# Patient Record
Sex: Male | Born: 1950 | Race: Black or African American | Hispanic: No | Smoking: Former smoker
Health system: Southern US, Community
[De-identification: ages and names within clinical notes are randomized; demographics above are authoritative.]

## PROBLEM LIST (undated history)

## (undated) DIAGNOSIS — I10 Essential (primary) hypertension: Secondary | ICD-10-CM

## (undated) DIAGNOSIS — C61 Malignant neoplasm of prostate: Secondary | ICD-10-CM

## (undated) DIAGNOSIS — R0789 Other chest pain: Secondary | ICD-10-CM

## (undated) DIAGNOSIS — R030 Elevated blood-pressure reading, without diagnosis of hypertension: Secondary | ICD-10-CM

## (undated) DIAGNOSIS — E785 Hyperlipidemia, unspecified: Secondary | ICD-10-CM

## (undated) DIAGNOSIS — C679 Malignant neoplasm of bladder, unspecified: Secondary | ICD-10-CM

## (undated) DIAGNOSIS — R9439 Abnormal result of other cardiovascular function study: Secondary | ICD-10-CM

## (undated) DIAGNOSIS — M1990 Unspecified osteoarthritis, unspecified site: Secondary | ICD-10-CM

## (undated) HISTORY — DX: Abnormal result of other cardiovascular function study: R94.39

## (undated) HISTORY — DX: Other chest pain: R07.89

## (undated) HISTORY — PX: BACK SURGERY: SHX140

## (undated) HISTORY — DX: Elevated blood-pressure reading, without diagnosis of hypertension: R03.0

## (undated) HISTORY — DX: Unspecified osteoarthritis, unspecified site: M19.90

## (undated) HISTORY — DX: Hyperlipidemia, unspecified: E78.5

## (undated) HISTORY — DX: Essential (primary) hypertension: I10

---
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
## 1998-06-23 ENCOUNTER — Emergency Department (HOSPITAL_COMMUNITY): Admission: EM | Admit: 1998-06-23 | Discharge: 1998-06-23 | Payer: Self-pay | Admitting: Emergency Medicine

## 1998-07-01 ENCOUNTER — Ambulatory Visit (HOSPITAL_COMMUNITY): Admission: RE | Admit: 1998-07-01 | Discharge: 1998-07-01 | Payer: Self-pay | Admitting: Emergency Medicine

## 1998-07-01 ENCOUNTER — Encounter: Payer: Self-pay | Admitting: Emergency Medicine

## 2003-10-05 ENCOUNTER — Encounter: Admission: RE | Admit: 2003-10-05 | Discharge: 2003-10-05 | Payer: Self-pay | Admitting: Neurosurgery

## 2003-10-21 ENCOUNTER — Encounter: Admission: RE | Admit: 2003-10-21 | Discharge: 2003-10-21 | Payer: Self-pay | Admitting: Neurosurgery

## 2003-11-17 ENCOUNTER — Encounter: Admission: RE | Admit: 2003-11-17 | Discharge: 2003-12-10 | Payer: Self-pay | Admitting: Neurosurgery

## 2010-04-15 ENCOUNTER — Encounter: Payer: Self-pay | Admitting: Internal Medicine

## 2010-04-22 ENCOUNTER — Ambulatory Visit (HOSPITAL_COMMUNITY): Admission: RE | Admit: 2010-04-22 | Discharge: 2010-04-22 | Payer: Self-pay | Admitting: Family Medicine

## 2010-04-22 ENCOUNTER — Telehealth (INDEPENDENT_AMBULATORY_CARE_PROVIDER_SITE_OTHER): Payer: Self-pay

## 2010-04-28 ENCOUNTER — Ambulatory Visit: Payer: Self-pay | Admitting: Cardiology

## 2010-04-28 ENCOUNTER — Encounter: Payer: Self-pay | Admitting: Cardiology

## 2010-04-28 ENCOUNTER — Encounter (INDEPENDENT_AMBULATORY_CARE_PROVIDER_SITE_OTHER): Payer: Self-pay | Admitting: *Deleted

## 2010-04-28 ENCOUNTER — Ambulatory Visit (HOSPITAL_COMMUNITY): Admission: RE | Admit: 2010-04-28 | Discharge: 2010-04-28 | Payer: Self-pay | Admitting: Cardiology

## 2010-04-28 DIAGNOSIS — R9431 Abnormal electrocardiogram [ECG] [EKG]: Secondary | ICD-10-CM

## 2010-04-28 DIAGNOSIS — I10 Essential (primary) hypertension: Secondary | ICD-10-CM | POA: Insufficient documentation

## 2010-04-28 DIAGNOSIS — M1990 Unspecified osteoarthritis, unspecified site: Secondary | ICD-10-CM

## 2010-04-28 DIAGNOSIS — R079 Chest pain, unspecified: Secondary | ICD-10-CM | POA: Insufficient documentation

## 2010-04-29 ENCOUNTER — Ambulatory Visit: Payer: Self-pay | Admitting: Cardiology

## 2010-04-29 ENCOUNTER — Encounter (INDEPENDENT_AMBULATORY_CARE_PROVIDER_SITE_OTHER): Payer: Self-pay | Admitting: *Deleted

## 2010-04-29 ENCOUNTER — Encounter (HOSPITAL_COMMUNITY): Admission: RE | Admit: 2010-04-29 | Discharge: 2010-04-29 | Payer: Self-pay | Admitting: Cardiology

## 2010-04-29 LAB — CONVERTED CEMR LAB
ALT: 19 units/L
ALT: 19 units/L (ref 0–53)
AST: 18 units/L (ref 0–37)
Albumin: 4.6 g/dL
Alkaline Phosphatase: 57 units/L (ref 39–117)
BUN: 11 mg/dL
Basophils Absolute: 0 10*3/uL (ref 0.0–0.1)
Basophils Relative: 0 % (ref 0–1)
Calcium: 9.5 mg/dL
Chloride: 104 meq/L
Glucose, Bld: 106 mg/dL
HCT: 41.5 %
HDL: 55 mg/dL
LDL Cholesterol: 177 mg/dL — ABNORMAL HIGH (ref 0–99)
MCHC: 32 g/dL (ref 30.0–36.0)
MCV: 86.1 fL
Monocytes Relative: 7 % (ref 3–12)
Neutro Abs: 4.1 10*3/uL (ref 1.7–7.7)
Neutrophils Relative %: 60 % (ref 43–77)
Potassium: 4.5 meq/L
RBC: 4.82 M/uL (ref 4.22–5.81)
RDW: 14.5 % (ref 11.5–15.5)
Sodium: 140 meq/L
Sodium: 140 meq/L (ref 135–145)
TSH: 2.14 microintl units/mL
Total Bilirubin: 0.5 mg/dL (ref 0.3–1.2)
Total Protein: 7.5 g/dL (ref 6.0–8.3)
VLDL: 22 mg/dL (ref 0–40)
WBC: 6.9 10*3/uL

## 2010-05-02 ENCOUNTER — Encounter: Payer: Self-pay | Admitting: Cardiology

## 2010-05-02 ENCOUNTER — Encounter (INDEPENDENT_AMBULATORY_CARE_PROVIDER_SITE_OTHER): Payer: Self-pay | Admitting: *Deleted

## 2010-05-04 ENCOUNTER — Ambulatory Visit: Payer: Self-pay | Admitting: Cardiology

## 2010-05-04 ENCOUNTER — Encounter (INDEPENDENT_AMBULATORY_CARE_PROVIDER_SITE_OTHER): Payer: Self-pay | Admitting: *Deleted

## 2010-05-04 DIAGNOSIS — E785 Hyperlipidemia, unspecified: Secondary | ICD-10-CM | POA: Insufficient documentation

## 2010-05-04 DIAGNOSIS — E663 Overweight: Secondary | ICD-10-CM | POA: Insufficient documentation

## 2010-05-04 LAB — CONVERTED CEMR LAB
Basophils Absolute: 0 10*3/uL
Basophils Relative: 0 %
Eosinophils Absolute: 0.1 10*3/uL
Eosinophils Relative: 1 %
Lymphocytes Relative: 31 %
Lymphs Abs: 2.1 10*3/uL
Monocytes Absolute: 0.5 10*3/uL
Monocytes Relative: 7 %

## 2010-11-10 ENCOUNTER — Encounter (INDEPENDENT_AMBULATORY_CARE_PROVIDER_SITE_OTHER): Payer: Self-pay | Admitting: *Deleted

## 2010-12-03 ENCOUNTER — Encounter: Payer: Self-pay | Admitting: Neurosurgery

## 2010-12-13 NOTE — Miscellaneous (Signed)
Summary: labs cbcd,cmp,lipids,tsh,04/29/2010  Clinical Lists Changes  Observations: Added new observation of ABSOLUTE BAS: 0.0 K/uL (05/04/2010 8:24) Added new observation of BASOPHIL %: 0 % (05/04/2010 8:24) Added new observation of EOS ABSLT: 0.1 K/uL (05/04/2010 8:24) Added new observation of % EOS AUTO: 1 % (05/04/2010 8:24) Added new observation of ABSOLUTE MON: 0.5 K/uL (05/04/2010 8:24) Added new observation of MONOCYTE %: 7 % (05/04/2010 8:24) Added new observation of ABS LYMPHOCY: 2.1 K/uL (05/04/2010 8:24) Added new observation of LYMPHS %: 31 % (05/04/2010 8:24) Added new observation of CALCIUM: 9.5 mg/dL (16/08/9603 5:40) Added new observation of ALBUMIN: 4.6 g/dL (98/09/9146 8:29) Added new observation of PROTEIN, TOT: 7.5 g/dL (56/21/3086 5:78) Added new observation of SGPT (ALT): 19 units/L (04/29/2010 8:24) Added new observation of SGOT (AST): 18 units/L (04/29/2010 8:24) Added new observation of ALK PHOS: 57 units/L (04/29/2010 8:24) Added new observation of CREATININE: 0.92 mg/dL (46/96/2952 8:41) Added new observation of BUN: 11 mg/dL (32/44/0102 7:25) Added new observation of BG RANDOM: 106 mg/dL (36/64/4034 7:42) Added new observation of CO2 PLSM/SER: 25 meq/L (04/29/2010 8:24) Added new observation of CL SERUM: 104 meq/L (04/29/2010 8:24) Added new observation of K SERUM: 4.5 meq/L (04/29/2010 8:24) Added new observation of NA: 140 meq/L (04/29/2010 8:24) Added new observation of LDL: 177 mg/dL (59/56/3875 6:43) Added new observation of HDL: 55 mg/dL (32/95/1884 1:66) Added new observation of TRIGLYC TOT: 108 mg/dL (05/12/1600 0:93) Added new observation of CHOLESTEROL: 254 mg/dL (23/55/7322 0:25) Added new observation of PLATELETK/UL: 282 K/uL (04/29/2010 8:24) Added new observation of MCV: 86.1 fL (04/29/2010 8:24) Added new observation of HCT: 41.5 % (04/29/2010 8:24) Added new observation of HGB: 13.3 g/dL (42/70/6237 6:28) Added new observation of WBC  COUNT: 6.9 10*3/microliter (04/29/2010 8:24) Added new observation of TSH: 2.140 microintl units/mL (04/29/2010 8:24)

## 2010-12-13 NOTE — Assessment & Plan Note (Signed)
Summary: **per Dr.Steve Luking for abnormal stress test on 04/22/10/tg   Visit Type:  Initial Consult Primary Provider:  Drucilla Chalet  CC:  abnormal stress test.  History of Present Illness: [name] is seen in the office today at the kind request of Dr. Lubertha South for further assessment of an abnormal stress test on 2 occasions.  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  Stress testing was initially undertaken due to right-sided chest discomfort.  This occurred in the region of a previous mild injury and was exacerbated by movement of the right arm.  There is class 2-3 dyspnea on exertion, which is chronic.  Lifestyle is sedentary.  Due to chronic back problems and knee problems, the patient tolerates very little activity.  He does not load or unload his truck's.  Cardiovascular risk factors are relatively modest.  He reports no hypertension, but it sounds as if blood pressures have been borderline to mildly elevated for some time.  He has not had diabetes.  He is not certain as to his lipid status, but believes that he hasn't tested at work and has never been told of a significant abnormality.  He has a 20-pack-year history of cigarette smoking that was discontinued 11 years ago.  There is no prominent family history of cardiac disease, but his mother may have suffered myocardial infarction at a young age.  Patient's most recent stress test from a few days ago was obtained and reviewed as well as office records from Dr. Gerda Diss.  There was one-2 mm flat and slowly upsloping ST segment depression in the inferior leads as well as V5-V6.  This occurred at a moderate level of exertion and resolved rapidly in recovery.  Overall exercise tolerance was excellent.  Current Medications (verified): 1)  Viagra 100 Mg Tabs (Sildenafil Citrate) .... Take 1/2 Tab As Directed 2)   Tylenol Extra Strength 500 Mg Tabs (Acetaminophen) .... Use As Needed For Pain  Allergies (verified): No Known Drug Allergies  Past History:  Family History: Last updated: May 25, 2010 Father died at age 36 as the result of leukemia  Mother died at age 14 with a history of myocardial infarction and status post mastectomy for carcinoma of the breast  Siblings: Patient is one of 59 brothers with no sisters.  One died in childbirth.  One died at age 61 due to neoplastic disease.  None of the remaining 6 brothers has any heart problems  Patient's brothers have had a total of approximately 7 children, four of them boys.  His father also comes from a family with multiple brothers and few sisters.  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  One died in childbirth.  One died at age 89 due to neoplastic disease.  None of the remaining 6 brothers has any heart problems  Patient's brothers have had a total of approximately 7  children, four of them boys.  His father also comes from a family with multiple brothers and few sisters.  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  The patient denies anorexia, fever, weight loss, weight gain,  vision loss, decreased hearing, hoarseness, syncope, peripheral edema, prolonged cough, headaches, hemoptysis, abdominal pain, melena, hematochezia, and severe indigestion/heartburn.    Vital Signs:  Patient profile:   60 year old male Height:      70 inches Weight:      250 pounds BMI:     36.00 Pulse rate:   80 / minute BP sitting:   155 / 84  (right arm)  Vitals Entered By: Dreama Saa, CNA (April 28, 2010 12:44 PM)  Physical Exam  General:  Somewhat overweight; well-developed; no acute distress: HEENT-[place]/AT; PERRL; EOM intact; conjunctiva and lids nl:  Neck-No JVD; no carotid bruits: Endocrine-borderline thyromegaly: Thorax-limited mobility of the lumbosacral spine Lungs-No tachypnea, clear without rales, rhonchi or wheezes: CV-normal PMI; normal S1 and S2; modest early systolic ejection murmur Abdomen-BS normal; soft and non-tender without masses or organomegaly: MS-No deformities, cyanosis or clubbing; full range of motion in the knees; no effusions Neurologic-Nl cranial nerves; symmetric strength and tone: Skin- Warm, no sig. lesions: Extremities-Nl distal pulses; trace edema; minor scarring over the knees and anterior lower legs    EKG  Procedure date:  04/28/2010  Findings:      Normal sinus rhythm Possible septal infarction, age undetermined Nonspecific T wave abnormality No significant change when compared to her previous tracing obtained 04/22/10   Impression & Recommendations:  Problem # 1:  CHEST PAIN (ICD-786.50) Based upon the patient's pretest likelihood for coronary disease, there is probably at least a 50% probability that his EKG stress test results represents a false positive.  The rapid resolution of ST segment depression in the absence of symptoms also supports this formulation.  Since he requires DOT approval to drive, objective evidence for the absence of significant coronary disease will be necessary.  We will proceed with a stress Myoview study  at the patient's earliest convenience.  Problem # 2:  HYPERTENSION, BORDERLINE (ICD-401.9) Patient has labile, but probably significant hypertension.  This could be an explanation for a false-positive stress test.  It also could account for his mild resting EKG abnormalities.  An echocardiogram will be performed looking for LVH and to verify normal LV systolic function in the face of a possible anteroseptal MI on his EKG.  [name] and I discussed his orthopaedic issues at some length.  He believes that he has been seen by Dr. Hilda Lias in the past and received intra-articular steroids.  I encouraged him to return to Dr. Sanjuan Dame office if his symptoms become more severe.  For now, I recommended ibuprofen or naproxen.  Patient is eager to complete his evaluation so that he can return to work.  Testing will be performed tomorrow and Monday with a return office visit on Tuesday.  Other Orders: 2-D Echocardiogram (2D Echo) Nuclear Stress Test (Nuc Stress Test) Future Orders: T-Comprehensive Metabolic Panel ([id number]) ... 04/29/2010 T-CBC w/Diff ([id number]) ... 04/29/2010 T-TSH [phone]) ... 04/29/2010 T-Lipid Profile [phone]) ... 04/29/2010  Patient Instructions: 1)  Your physician recommends that you schedule a follow-up appointment in: Monday 2)  Your physician recommends that you return for lab work in: Friday 3)  Your physician has requested that you have an echocardiogram.  Echocardiography is a painless test that uses sound waves to create images of your heart. It provides your doctor with information about the size and shape of your heart and how well your heart's chambers and valves are working.  This procedure takes approximately one hour. There are no restrictions for this procedure. 4)  Your physician has requested that you have an exercise stress myoview.  For further information please visit [email or web address]  Please follow instruction sheet, as given. 5)  Your  physician has requested that you regularly monitor and record your blood pressure readings at home.  Please use the same machine at the same time of day to check your readings and record them to bring to your follow-up visit.

## 2010-12-13 NOTE — Miscellaneous (Signed)
Summary: echo 04/28/2010  Clinical Lists Changes  Observations: Added new observation of ECHOINTERP:   Study Conclusions    - Left ventricle: The cavity size was normal. Borderline LVH was     present. Systolic function was normal. The estimated ejection     fraction was in the range of 55% to 60%. Wall motion was normal;     there were no regional wall motion abnormalities.   - Left atrium: The atrium was mildly dilated.   - Right ventricle: The cavity size was normal. Wall thickness was     mildly increased.   - Atrial septum: No defect or patent foramen ovale was identified.   - Pericardium, extracardiac: A trivial pericardial effusion was     identified adjacent to the right atrium.   Transthoracic echocardiography. M-mode, complete 2D, spectral   Doppler, and color Doppler. Height: Height: 177.8cm. Height: 70in.   Weight: Weight: 113.4kg. Weight: 249.5lb. Body mass index: BMI:   35.9kg/m^2. Body surface area: BSA: 2.24m^2. Patient status:   Outpatient. Location: Echo laboratory.    --------------------------------------------------------------------  (04/28/2010 8:54)      Echocardiogram  Procedure date:  04/28/2010  Findings:        Study Conclusions    - Left ventricle: The cavity size was normal. Borderline LVH was     present. Systolic function was normal. The estimated ejection     fraction was in the range of 55% to 60%. Wall motion was normal;     there were no regional wall motion abnormalities.   - Left atrium: The atrium was mildly dilated.   - Right ventricle: The cavity size was normal. Wall thickness was     mildly increased.   - Atrial septum: No defect or patent foramen ovale was identified.   - Pericardium, extracardiac: A trivial pericardial effusion was     identified adjacent to the right atrium.   Transthoracic echocardiography. M-mode, complete 2D, spectral   Doppler, and color Doppler. Height: Height: 177.8cm. Height: 70in.   Weight: Weight:  113.4kg. Weight: 249.5lb. Body mass index: BMI:   35.9kg/m^2. Body surface area: BSA: 2.28m^2. Patient status:   Outpatient. Location: Echo laboratory.    --------------------------------------------------------------------

## 2010-12-13 NOTE — Miscellaneous (Signed)
Summary: nuclear study 04/29/2010  Clinical Lists Changes  Observations: Added new observation of NUCLEAR NOS:   Ordering Physician: [place] Bing    Reading Physician: [place] Bing    Clinical Data: 60 year old gentleman with an abnormal graded   exercise test, who is .  employed as a IT trainer and requires a   Doctor, general practice.    NUCLEAR MEDICINE STRESS MYOVIEW STUDY WITH SPECT AND LEFT   VENTRICULAR EJECTION FRACTION    Radionuclide Data: One-day rest/stress protocol performed with   10/30 mCi of Tc-43m Myoview.    Stress Data: Treadmill exercise performed to a workload of 10.4   mets and a heart rate of 150, 92% of patients age predicted   maximum.  Exercise discontinued due to dyspnea and fatigue; no   chest discomfort reported.  Blood pressure increased from a resting   value of 140/80 to 160/90 during exercise and 180/90 early in   recovery, a normal response.  No arrhythmias noted.    EKG: Normal sinus rhythm; shallow inferior T-wave inversions.   Stress EKG: Insignificant upsloping ST-segment depression    Scintigraphic Data: Acquisition notable for minor movement during   both phases of the study.  The left ventricle was mildly dilated.   There was very mild diaphragmatic attenuation.  On tomographic   images reconstructed in standard planes, a small mild defect was   present inferiorly.  Findings on the resting portion of the study   were identical.  The gated reconstruction revealed normal regional   and global LV systolic function as well as normal systolic   accentuation of activity throughout.  Estimated ejection fraction   was 56%.    IMPRESSION:   Negative stress nuclear myocardial study revealing fairly good   exercise capacity, a negative stress EKG, mild left ventricular   dilatation and normal left ventricular systolic function.  By   scintigraphic imaging, myocardial perfusion was normal except for   the presence of minor diaphragmatic  attenuation.  Other findings as   noted. (04/29/2010 8:53)      Nuclear Study  Procedure date:  04/29/2010  Findings:        Ordering Physician: [place] Bing    Reading Physician: [place] Bing    Clinical Data: 60 year old gentleman with an abnormal graded   exercise test, who is .  employed as a IT trainer and requires a   Doctor, general practice.    NUCLEAR MEDICINE STRESS MYOVIEW STUDY WITH SPECT AND LEFT   VENTRICULAR EJECTION FRACTION    Radionuclide Data: One-day rest/stress protocol performed with   10/30 mCi of Tc-45m Myoview.    Stress Data: Treadmill exercise performed to a workload of 10.4   mets and a heart rate of 150, 92% of patients age predicted   maximum.  Exercise discontinued due to dyspnea and fatigue; no   chest discomfort reported.  Blood pressure increased from a resting   value of 140/80 to 160/90 during exercise and 180/90 early in   recovery, a normal response.  No arrhythmias noted.    EKG: Normal sinus rhythm; shallow inferior T-wave inversions.   Stress EKG: Insignificant upsloping ST-segment depression    Scintigraphic Data: Acquisition notable for minor movement during   both phases of the study.  The left ventricle was mildly dilated.   There was very mild diaphragmatic attenuation.  On tomographic   images reconstructed in standard planes, a small mild defect was   present inferiorly.  Findings on the resting portion of  the study   were identical.  The gated reconstruction revealed normal regional   and global LV systolic function as well as normal systolic   accentuation of activity throughout.  Estimated ejection fraction   was 56%.    IMPRESSION:   Negative stress nuclear myocardial study revealing fairly good   exercise capacity, a negative stress EKG, mild left ventricular   dilatation and normal left ventricular systolic function.  By   scintigraphic imaging, myocardial perfusion was normal except for   the presence of  minor diaphragmatic attenuation.  Other findings as   noted.

## 2010-12-13 NOTE — Assessment & Plan Note (Signed)
Summary: f/u early next week per checkout on 04/28/10/tg   Visit Type:  Follow-up Primary Provider:  Drucilla Chalet   History of Present Illness: Return visit for this very pleasant 60 -year-old gentleman with an abnormal standard treadmill test and atypical right chest discomfort, most likely of musculoskeletal origin.  Since his last visit, he has been asymptomatic from a cardiopulmonary standpoint.  An echocardiogram showed borderline LVH and was otherwise normal.  A stress nuclear study showed good exercise tolerance with no evidence for myocardial ischemia.  Ironically, his stress EKG was not even abnormal on that study although it had been abnormal on 2 previous occasions.  EKG  Procedure date:  05/04/2010  Findings:      Normal sinus rhythm Within normal limits No previous tracing for comparison.   Current Medications (verified): 1)  Viagra 100 Mg Tabs (Sildenafil Citrate) .... Take 1/2 Tab As Directed 2)  Tylenol Extra Strength 500 Mg Tabs (Acetaminophen) .... Use As Needed For Pain 3)  Chlorthalidone 25 Mg Tabs (Chlorthalidone) .... Take 1/2  Tablet By Mouth Daily 4)  Simvastatin 40 Mg Tabs (Simvastatin) .... 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    Vital Signs:  Patient profile:   60 year old male Weight:      251 pounds Pulse rate:   62 / minute BP sitting:   151 / 90  (right arm)  Vitals Entered By: Dreama Saa, CNA (May 04, 2010 10:37 AM)  Physical Exam  General:  Somewhat overweight; well-developed; no acute distress: HEENT-[place]/AT; PERRL; EOM intact; conjunctiva and  lids nl:  Neck-No JVD; no carotid bruits: Endocrine-borderline thyromegaly: Lungs-No tachypnea, clear without rales, rhonchi or wheezes: CV-normal PMI; normal S1 and S2; modest early systolic ejection murmur Abdomen-BS normal; soft and non-tender without masses or organomegaly: MS-No deformities, cyanosis or clubbing; full range of motion in the knees; no effusions Neurologic-Nl cranial nerves; symmetric strength and tone: Skin- Warm, no sig. lesions: Extremities-Nl distal pulses; trace edema   Impression & Recommendations:  Problem # 1:  HYPERTENSION, BORDERLINE (ICD-401.9) Blood pressure has been abnormal on 2 office visits.  Moreover, echocardiogram shows evidence for LVH, which also probably accounts for his EKG abnormalities.  Pharmacologic treatment is warranted, and diuretics will be started.  Problem # 2:  HYPERLIPIDEMIA (ICD-272.4) LDL cholesterol is 177, which does not, strictly speaking, qualify for pharmacologic therapy by current guidelines, but in the setting of metabolic syndrome, treatment is certainly appropriate.  Simvastatin 40 mg q.d. will be started with a lipid profile assessed in one month.  Problem # 3:  OVERWEIGHT (ICD-278.02) Weight loss was discussed at length with Mr. Dian Situ.  He does not appear to have confidence that he can accomplish this, but continued encouragement should be offered.  Problem # 4:  CHEST PAIN (ICD-786.50) His chest pain has the characteristics of discomfort of musculoskeletal origin and should not raise additional concern regarding heart disease.   A negative stress nuclear study with the patient achieving a good workload and heart rate is quite reassuring regarding the absence of significant obstructive coronary disease.  The positive standard treadmill test likely represents a false positive related to LVH.  I  will be happy to see this nice gentleman at anytime that Dr. Gerda Diss deems appropriate.  His records will be forwarded to to HR at  his place of employment at his request, Fax #[phone]  Other Orders: Future Orders: T-Comprehensive Metabolic Panel [id number]) ... 06/03/2010 T-Lipid Profile [id number]) ... 06/03/2010  Patient Instructions: 1)  Your physician recommends that you schedule a follow-up appointment in: as needed 2)  Your physician recommends that you return for lab work in: 1 month 3)  Your physician has recommended you make the following change in your medication: start chlorthalidone 25mg  1/2 tablet daily 4)  simvastatin 40mg  daily 5)  consent to send medical information to your HR office Prescriptions: SIMVASTATIN 40 MG TABS (SIMVASTATIN) Take one tablet by mouth daily at bedtime  #30 x 3   Entered by:   Teressa Lower RN   Authorized by:   Kathlen Brunswick, MD, [facility]   Signed by:   Teressa Lower RN on 05/04/2010   Method used:   Electronically to        [facility] [phone]* (retail)       [address]       [place], [place]  [address]       Ph: [phone] or [phone]       Fax: [phone]   RxID:   [id number] CHLORTHALIDONE 25 MG TABS (CHLORTHALIDONE) Take 1/2  tablet by mouth daily  #15 x 3   Entered by:   Teressa Lower RN   Authorized by:   Kathlen Brunswick, MD, [facility]   Signed by:   Teressa Lower RN on 05/04/2010   Method used:   Electronically to        [facility] [phone]* (retail)       [address]       [place], [place]  [address]       Ph: [phone] or [phone]       Fax: [phone]   RxID:   [id number]

## 2010-12-13 NOTE — Letter (Signed)
Summary: [place] Results Engineer, agricultural at [facility]  [address], [place] [address]   Phone: [phone]  Fax: [phone]      May 02, 2010 MRN: [id number]   MYLES TAVELLA [address] [place], [place]  [address]   Dear Mr. KENYON,  Your test ordered by Selena Batten has been reviewed by your physician (or physician assistant) and was found to be normal or stable. Your physician (or physician assistant) felt no changes were needed at this time.  __x__ Echocardiogram  __x__ Cardiac Stress Test  ____ Lab Work  ____ Peripheral vascular study of arms, legs or neck  ____ CT scan or X-ray  ____ Lung or Breathing test  ____ Other:  No change in medical treatment at this time, per Dr. Dietrich Pates.  Thank you, Romulo Okray Allyne Gee RN    [place] Bing, MD, [facility].C.Gaylord Shih, MD, F.A.C.C Lewayne Bunting, MD, F.A.C.C Nona Dell, MD, F.A.C.C Charlton Haws, MD, [facility].C.C

## 2010-12-13 NOTE — Letter (Signed)
Summary: [place] [facility] Work Engineer, agricultural at [employer]  [address] [place] [address]   Phone: [phone]  Fax: [phone]     May 04, 2010 MRN: [id number]   Tony Powell [address] [place], [place]  [address]      YOUR LAB WORK IS DUE   June 03, 2010 Please go to [facility], located across the street from [facility] on the second floor.  Hours are Monday - Friday 7am until 7:30pm         Saturday 8am until 12noon    _X_  DO NOT EAT OR DRINK AFTER MIDNIGHT EVENING PRIOR TO LABWORK  __ YOUR LABWORK IS NOT FASTING --YOU MAY EAT PRIOR TO LABWORK

## 2010-12-13 NOTE — Letter (Signed)
Summary: [place] [facility] (Nuc Med Stress)  [facility]  [address] [place] [address]   Phone: [phone]  Fax: [phone]    Nuclear Medicine 1-Day Stress Test Information Sheet  Re:     SIRR KABEL   DOB:     [birth year] MRN:     [id number] Weight:  Appointment Date: Register at: Appointment Time: Referring MD:  _x__Exercise Stress  __Adenosine   __Dobutamine  __Lexiscan  __Persantine   __Thallium  Urgency: ____1 (next day)   ____2 (one week)    ____3 (PRN)  Patient will receive Follow Up call with results: Patient needs follow-up appointment:  Instructions regarding medication:  How to prepare for your stress test: 1. NOTHING TO EAT OR DRINK AFTER MIDNIGHT AND NO MEDICATIONS PRIOR TO TEST 2. DO NOT use any tobacco products for at leaset 8 hours prior to arrival. 3. DO NOT wear dresses or any clothing that may have metal clasps or buttons. 4. Wear short sleeve shirts, loose clothing, and comfortalbe walking shoes. 5. DO NOT use lotions, oils or powder on your chest before the test. 6. The test will take approximately 3-4 hours from the time you arrive until completion. 7. To register the day of the test, go to the Short Stay entrance at [facility]. 8. If you must cancel your test, call [phone] as soon as you are aware.  After you arrive for test:   When you arrive at [facility], you will go to Short Stay to be registered. They will then send you to Radiology to check in. The Nuclear Medicine Tech will get you and start an IV in your arm or hand. A small amount of a radioactive tracer will then be injected into your IV. This tracer will then have to circulate for 30-45 minutes. During this time you will wait in the waiting room and you will be able to drink something without caffeine. A series of pictures will be taken of your heart follwoing this waiting period. After the 1st set of pictures you will go to the stress lab to get  ready for your stress test. During the stress test, another small amount of a radioactive tracer will be injected through your IV. When the stress test is complete, there is a short rest period while your heart rate and blood pressure will be monitored. When this monitoring period is complete you will have another set of pictrues taken. (The same as the 1st set of pictures). These pictures are taken between 15 minutes and 1 hour after the stress test. The time depends on the type of stress test you had. Your doctor will inform you of your test results within 7 days after test.    The possibilities of certain changes are possible during the test. They include abnormal blood pressure and disorders of the heart. Side effects of persantine or adenosine can include flushing, chest pain, shortness of breath, stomach tightness, headache and light-headedness. These side effects usually do not last long and are self-resolving. Every effort will be made to keep you comfortable and to minimize complications by obtaining a medical history and by close observation during the test. Emergency equipment, medications, and trained personnel are available to deal with any unusual situation which may arise.  Please notify office at least 48 hours in advance if you are unable to keep this appt.

## 2010-12-13 NOTE — Letter (Signed)
Summary: Internal Other Domingo Dimes  Internal Other Domingo Dimes   Imported By: Ave Filter 04/15/2010 13:41:36  _____________________________________________________________________  External Attachment:    Type:   Image     Comment:   External Document

## 2010-12-13 NOTE — Progress Notes (Signed)
Summary: phone message/ cancel TCS  Phone Note Call from Patient   Caller: Patient Summary of Call: Pt left VM that he needs to cancel appt for TCS on Mon, he is having other problems. I returned his call, left message that  i have cancelled and to call when ready to schedule. Selena Batten is aware. Initial call taken by: Cloria Spring LPN,  April 22, 2010 4:19 PM

## 2010-12-15 NOTE — Letter (Signed)
Summary: [facility]  [address] [place] [address]   Phone: [phone]  Fax: [phone]     November 10, 2010 MRN: [id number]   Tony Powell [address] [place], [place]  [address]      YOUR LAB WORK IS DUE   November 15, 2010  Please go to [facility], located across the street from [facility] on the second floor.  Hours are Monday - Friday 7am until 7:30pm         Saturday 8am until 12noon      _X_ YOUR LABWORK IS NOT FASTING --YOU MAY EAT PRIOR TO LABWORK

## 2011-05-25 ENCOUNTER — Encounter (HOSPITAL_COMMUNITY)
Admission: RE | Admit: 2011-05-25 | Discharge: 2011-05-25 | Disposition: A | Discharge status: Home / Self Care | Payer: Worker's Compensation | Source: Ambulatory Visit | Attending: Neurosurgery | Admitting: Neurosurgery

## 2011-05-25 LAB — DIFFERENTIAL
Basophils Absolute: 0 10*3/uL (ref 0.0–0.1)
Eosinophils Absolute: 0.1 10*3/uL (ref 0.0–0.7)
Lymphs Abs: 2.8 10*3/uL (ref 0.7–4.0)
Neutrophils Relative %: 62 % (ref 43–77)

## 2011-05-25 LAB — CBC
MCV: 83.6 fL (ref 78.0–100.0)
Platelets: 282 10*3/uL (ref 150–400)
RBC: 4.87 MIL/uL (ref 4.22–5.81)
RDW: 14.3 % (ref 11.5–15.5)
WBC: 9.5 10*3/uL (ref 4.0–10.5)

## 2011-05-25 LAB — SURGICAL PCR SCREEN
MRSA, PCR: NEGATIVE
Staphylococcus aureus: NEGATIVE

## 2011-05-26 LAB — BASIC METABOLIC PANEL
BUN: 12 mg/dL (ref 6–23)
Calcium: 9.6 mg/dL (ref 8.4–10.5)
Chloride: 105 mEq/L (ref 96–112)
Creatinine, Ser: 0.9 mg/dL (ref 0.50–1.35)
GFR calc Af Amer: >60 mL/min (ref >60)

## 2011-05-29 ENCOUNTER — Ambulatory Visit (HOSPITAL_COMMUNITY)
Admission: RE | Admit: 2011-05-29 | Discharge: 2011-05-30 | Disposition: A | Discharge status: Home / Self Care | Payer: Worker's Compensation | Source: Ambulatory Visit | Attending: Neurosurgery | Admitting: Neurosurgery

## 2011-05-29 ENCOUNTER — Ambulatory Visit (HOSPITAL_COMMUNITY): Payer: Worker's Compensation

## 2011-05-29 ENCOUNTER — Other Ambulatory Visit (HOSPITAL_COMMUNITY): Payer: Self-pay | Admitting: Neurosurgery

## 2011-05-29 ENCOUNTER — Ambulatory Visit (HOSPITAL_COMMUNITY)
Admission: RE | Admit: 2011-05-29 | Discharge: 2011-05-29 | Disposition: A | Discharge status: Home / Self Care | Payer: Worker's Compensation | Source: Ambulatory Visit | Attending: Neurosurgery | Admitting: Neurosurgery

## 2011-05-29 DIAGNOSIS — Z01812 Encounter for preprocedural laboratory examination: Secondary | ICD-10-CM | POA: Insufficient documentation

## 2011-05-29 DIAGNOSIS — M48061 Spinal stenosis, lumbar region without neurogenic claudication: Secondary | ICD-10-CM

## 2011-05-29 DIAGNOSIS — Z01818 Encounter for other preprocedural examination: Secondary | ICD-10-CM | POA: Insufficient documentation

## 2011-05-29 DIAGNOSIS — Z01810 Encounter for preprocedural cardiovascular examination: Secondary | ICD-10-CM | POA: Insufficient documentation

## 2011-05-29 DIAGNOSIS — Z79899 Other long term (current) drug therapy: Secondary | ICD-10-CM | POA: Insufficient documentation

## 2011-05-29 DIAGNOSIS — I10 Essential (primary) hypertension: Secondary | ICD-10-CM | POA: Insufficient documentation

## 2011-05-29 LAB — ABO/RH: ABO/RH(D): AB POS

## 2011-06-01 NOTE — Op Note (Signed)
  NAMEARMSTEAD, HEILAND NO.:  [id number]  MEDICAL RECORD NO.:  [id number]  LOCATION:  3523                         FACILITY:  [facility]  PHYSICIAN:  Kathaleen Maser. Azelyn Batie, M.D.    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  PROCEDURE:  Right L4-5 decompressive laminotomy and right L4-L5 decompressive foraminotomies.  SURGEON:  Kathaleen Maser. Dagoberto Nealy, MD  ASSISTANT:  None.  ANESTHESIA:  General endotracheal.  INDICATIONS:  Mr. Reiling is a 60 year old male with history of back and right lower extremity pain failing conservative management.  Workup demonstrates evidence of some degree of stenosis off the right-sided L4- 5.  The patient is counseled as to his options.  He decided to proceed with a right-sided L4-5 decompressive laminotomy and foraminotomy to help improving his symptoms.  OPERATIVE NOTE:  The patient was taken to the operating room and placed on the op table in supine position.  After an adequate level of anesthesia was achieved, the patient was placed prone onto a Wilson frame and appropriately padded.  The patient's lumbar region was prepped and draped sterilely.  A 10 blade was used to make a curvilinear skin incision overlying the L4-5 interspace.  This was carried down sharply in midline.  A subperiosteal dissection was performed to expose the lamina facet joints of L4 and L5.  Deep self-retaining retractors were placed.  Intraoperative x-ray was taken and in fact the L3-4 level had been exposed.  The dissection then redirected to one level caudally. Distractor was replaced and laminotomy was then performed using high- speed drill and Kerrison rongeurs removing the inner aspect of the lamina of L4, medial aspect of the L4-5 facet joint, and  superior rim of the L5 lamina.  The ligamentum flavum was then elevated and resected in piecemeal fashion.  Underlying thecal sac and exiting L5 nerve root were identified.  Wide decompressive foraminotomies were then performed along the course of the exiting L5 nerve root.  The L4 nerve root foramen was identified and foraminotomy was also completed in this region. Microscope was then brought into field for microdissection.  The thecal sac and L5 nerve root were gently mobilized.  The disk space was inspected.  This was reasonably flat without evidence of tearing or herniation.  Wound was then irrigated with antibiotic solution.  Gelfoam was placed topically for hemostasis which was found to be good.  Microscope and retractor system were removed.  Hemostasis was achieved with electrocautery.  The wound was then closed in layers with Vicryl sutures.  Steri-Strips and sterile dressings were applied.  There were no complications.  The patient tolerated the procedure well and he returns to the recovery room postoperatively.          ______________________________ Kathaleen Maser Keva Darty, M.D.     HAP/MEDQ  D:  05/29/2011  T:  05/30/2011  Job:  [id number]  Electronically Signed by Julio Sicks M.D. on 06/01/2011 11:47:46 PM

## 2011-09-04 IMAGING — NM NM MYOCAR MULTI W/SPECT W/WALL MOTION & EF
2 series · 12 of 12 positions shown · non-contrast
Comparison: none

Ordering Physician: Alden-Mika Estijarte

Colt Physician: [REDACTED]al Data: 58-year-old gentleman with an abnormal graded
exercise test, who is .  employed as a trucker and requires a
commercial driving license.
NUCLEAR MEDICINE STRESS MYOVIEW STUDY WITH SPECT AND LEFT
VENTRICULAR EJECTION FRACTION
Radionuclide Data: One-day rest/stress protocol performed with
[DATE] mCi of 7c-EEm Myoview.
Stress Data: Treadmill exercise performed to a workload of
mets and a heart rate of 150, 92% of patients age predicted
maximum.  Exercise discontinued due to dyspnea and fatigue; no
chest discomfort reported.  Blood pressure increased from a resting
value of 140/80 to 160/90 during exercise and 180/90 early in
recovery, a normal response.  No arrhythmias noted.
EKG: Normal sinus rhythm; shallow inferior T-wave inversions.
Stress EKG: Insignificant upsloping ST-segment depression
Scintigraphic Data: Acquisition notable for minor movement during
both phases of the study.  The left ventricle was mildly dilated.
There was very mild diaphragmatic attenuation.  On tomographic
images reconstructed in standard planes, a small mild defect was
present inferiorly.  Findings on the resting portion of the study
were identical.  The gated reconstruction revealed normal regional
and global LV systolic function as well as normal systolic
accentuation of activity throughout.  Estimated ejection fraction
was 56%.

[Series 1: cr cardiac tc low dose · 6.41mm/px · 6 of 64 frames shown]
[frame 6/64]
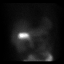
[frame 16/64]
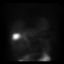
[frame 27/64]
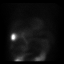
[frame 38/64]
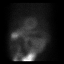
[frame 48/64]
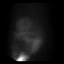
[frame 59/64]
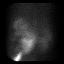

[Series 1: cs cardiac tc hi dose · 6.41mm/px · 6 of 512 frames shown]
[frame 43/512]
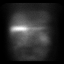
[frame 128/512]
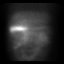
[frame 214/512]
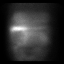
[frame 299/512]
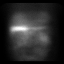
[frame 384/512]
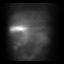
[frame 470/512]
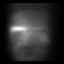

[12 of 12 positions shown; findings below may reference images not displayed]

IMPRESSION: Negative stress nuclear myocardial study revealing fairly good
exercise capacity, a negative stress EKG, mild left ventricular
dilatation and normal left ventricular systolic function.  By
scintigraphic imaging, myocardial perfusion was normal except for
the presence of minor diaphragmatic attenuation.  Other findings as
noted.

## 2011-11-13 ENCOUNTER — Encounter: Payer: Self-pay | Admitting: Cardiology

## 2013-02-07 ENCOUNTER — Emergency Department (HOSPITAL_COMMUNITY)
Admission: EM | Admit: 2013-02-07 | Discharge: 2013-02-07 | Disposition: A | Discharge status: Home / Self Care | Payer: Self-pay | Attending: Emergency Medicine | Admitting: Emergency Medicine

## 2013-02-07 ENCOUNTER — Encounter (HOSPITAL_COMMUNITY): Payer: Self-pay | Admitting: *Deleted

## 2013-02-07 DIAGNOSIS — M5430 Sciatica, unspecified side: Secondary | ICD-10-CM | POA: Insufficient documentation

## 2013-02-07 DIAGNOSIS — M5431 Sciatica, right side: Secondary | ICD-10-CM

## 2013-02-07 DIAGNOSIS — M51379 Other intervertebral disc degeneration, lumbosacral region without mention of lumbar back pain or lower extremity pain: Secondary | ICD-10-CM | POA: Insufficient documentation

## 2013-02-07 DIAGNOSIS — Z9889 Other specified postprocedural states: Secondary | ICD-10-CM | POA: Insufficient documentation

## 2013-02-07 DIAGNOSIS — Z862 Personal history of diseases of the blood and blood-forming organs and certain disorders involving the immune mechanism: Secondary | ICD-10-CM | POA: Insufficient documentation

## 2013-02-07 DIAGNOSIS — M5137 Other intervertebral disc degeneration, lumbosacral region: Secondary | ICD-10-CM | POA: Insufficient documentation

## 2013-02-07 DIAGNOSIS — Z8639 Personal history of other endocrine, nutritional and metabolic disease: Secondary | ICD-10-CM | POA: Insufficient documentation

## 2013-02-07 DIAGNOSIS — Z79899 Other long term (current) drug therapy: Secondary | ICD-10-CM | POA: Insufficient documentation

## 2013-02-07 DIAGNOSIS — M5136 Other intervertebral disc degeneration, lumbar region: Secondary | ICD-10-CM

## 2013-02-07 DIAGNOSIS — R209 Unspecified disturbances of skin sensation: Secondary | ICD-10-CM | POA: Insufficient documentation

## 2013-02-07 DIAGNOSIS — Z8679 Personal history of other diseases of the circulatory system: Secondary | ICD-10-CM | POA: Insufficient documentation

## 2013-02-07 DIAGNOSIS — Z87891 Personal history of nicotine dependence: Secondary | ICD-10-CM | POA: Insufficient documentation

## 2013-02-07 MED ORDER — SILDENAFIL CITRATE 100 MG PO TABS: 100.0000 mg | ORAL_TABLET | Freq: Every day | ORAL | Status: DC | PRN

## 2013-02-07 MED ORDER — HYDROMORPHONE HCL PF 1 MG/ML IJ SOLN
1.0000 mg | Freq: Once | INTRAMUSCULAR | Status: AC
  Administered 2013-02-07: 1 mg via INTRAMUSCULAR
  Filled 2013-02-07: qty 1

## 2013-02-07 MED ORDER — OXYCODONE-ACETAMINOPHEN 5-325 MG PO TABS: 1.0000 | ORAL_TABLET | Freq: Four times a day (QID) | ORAL | Status: DC | PRN

## 2013-02-07 MED ORDER — PREDNISONE 20 MG PO TABS: ORAL_TABLET | ORAL | Status: DC

## 2013-02-07 MED ORDER — PREDNISONE 50 MG PO TABS
60.0000 mg | ORAL_TABLET | Freq: Once | ORAL | Status: AC
  Administered 2013-02-07: 60 mg via ORAL
  Filled 2013-02-07: qty 1

## 2013-02-07 NOTE — ED Provider Notes (Addendum)
History     CSN: [id number]  Arrival date & time 02/07/13  4098   First MD Initiated Contact with Patient 02/07/13 1922      Chief Complaint  Patient presents with  . Back Pain    (Consider location/radiation/quality/duration/timing/severity/associated sxs/prior treatment) Patient is a 62 y.o. male presenting with back pain. The history is provided by the patient.  Back Pain Associated symptoms: no fever and no weakness   pt with hx ddd, prior lumbar disc surgery, 2012, Dr Jordan Likes.  Pt notes low back pain, esp right, radiating down posterior/lateral right buttock, hip and leg to foot for the past 3 weeks. Constant, , mod-severe, worse w certain position changes, bending at waist.  Denies recent injury or fall. States occasional tingly/numb sensation down leg. No perineal or saddle area numbness.  No urinary incontinence or retention.  Denies fever or chills.     Past Medical History  Diagnosis Date  . Abnormal stress test   . Chest discomfort     Atypical  . Borderline hypertension   . DJD (degenerative joint disease)     Knees, lumbar sacral spine  . Hyperlipidemia     Past Surgical History  Procedure Laterality Date  . Back surgery      Family History  Problem Relation Age of Onset  . Heart attack Mother   . Breast cancer Mother     Mastectomy  . Leukemia Father     History  Substance Use Topics  . Smoking status: Former Smoker -- 1.00 packs/day for 20 years    Quit date: 11/13/1998  . Smokeless tobacco: Not on file  . Alcohol Use: No      Review of Systems  Constitutional: Negative for fever and chills.  HENT: Negative for neck pain.   Musculoskeletal: Positive for back pain. Negative for gait problem.  Skin: Negative for rash.  Neurological: Negative for weakness.    Allergies  Review of patient's allergies indicates no known allergies.  Home Medications   Current Outpatient Rx  Name  Route  Sig  Dispense  Refill  . ibuprofen (ADVIL,MOTRIN) 200  MG tablet   Oral   Take 600 mg by mouth 4 (four) times daily as needed for pain.         . naproxen sodium (ALEVE) 220 MG tablet   Oral   Take 660 mg by mouth daily as needed (for pain).         Marland Kitchen oxyCODONE-acetaminophen (PERCOCET) 10-325 MG per tablet   Oral   Take 0.5-1 tablets by mouth every 4 (four) hours as needed for pain.         . sildenafil (VIAGRA) 100 MG tablet   Oral   Take 50 mg by mouth as directed.           . verapamil (CALAN-SR) 240 MG CR tablet   Oral   Take 240 mg by mouth at bedtime.           BP 174/104  Pulse 96  Temp(Src) 99.6 F (37.6 C) (Oral)  Resp 20  Ht 5\' 10"  (1.778 m)  Wt 236 lb (107.049 kg)  BMI 33.86 kg/m2  SpO2 99%  Physical Exam  Nursing note and vitals reviewed. Constitutional: He is oriented to person, place, and time. He appears well-developed and well-nourished. No distress.  HENT:  Head: Atraumatic.  Eyes: Conjunctivae are normal.  Neck: Neck supple. No tracheal deviation present.  Cardiovascular: Normal rate.   Pulmonary/Chest: Effort normal. No accessory muscle  usage. No respiratory distress.  Abdominal: Soft. He exhibits no distension. There is no tenderness.  Genitourinary:  No cva tenderness  Musculoskeletal: Normal range of motion. He exhibits no edema and no tenderness.  Right lumbar and right sciatic notch tenderness, tls spine non tender, aligned, no step off. No focal midline pain or bony tenderness noted.   Neurological: He is alert and oriented to person, place, and time. He displays normal reflexes.  Motor intact bil. Pt ambulates w steady gait. 5/5 plantar and dorsiflexion at ankle and of great toe. Straight leg raise pos.   Skin: Skin is warm and dry. He is not diaphoretic.  Psychiatric: He has a normal mood and affect.    ED Course  Procedures (including critical care time)    MDM  Dilaudid im.  Pt has ride, does not have to drive.  Prednisone po..  Reviewed nursing notes and prior charts for  additional history.   Pt says sees Dr Jordan Likes in [place] for ns.  Discussed f/u there this coming week - call Monday for appt.  Recheck pt comfortable, appears stable or d/c.    Pt asks for rx for viagra 100 mg tab, #3, he states pharmacist gave him coupon voucher but needs the rx. Has taken same dose for long while without adverse rxn.      Suzi Roots, MD 02/07/13 2017  Suzi Roots, MD 02/07/13 2022

## 2013-02-07 NOTE — ED Notes (Signed)
Back pain for 3 weeks

## 2013-02-07 NOTE — ED Notes (Signed)
nad noted prior to dc. Dc instructions reviewed and explained. 3 scripts given to pt. Ambulated without difficulty. Wife at bsd.

## 2013-02-16 ENCOUNTER — Encounter (HOSPITAL_COMMUNITY): Payer: Self-pay | Admitting: Emergency Medicine

## 2013-02-16 ENCOUNTER — Emergency Department (HOSPITAL_COMMUNITY)
Admission: EM | Admit: 2013-02-16 | Discharge: 2013-02-16 | Disposition: A | Discharge status: Home / Self Care | Payer: Self-pay | Attending: Emergency Medicine | Admitting: Emergency Medicine

## 2013-02-16 DIAGNOSIS — Z87891 Personal history of nicotine dependence: Secondary | ICD-10-CM | POA: Insufficient documentation

## 2013-02-16 DIAGNOSIS — Z8739 Personal history of other diseases of the musculoskeletal system and connective tissue: Secondary | ICD-10-CM | POA: Insufficient documentation

## 2013-02-16 DIAGNOSIS — M5430 Sciatica, unspecified side: Secondary | ICD-10-CM | POA: Insufficient documentation

## 2013-02-16 DIAGNOSIS — E785 Hyperlipidemia, unspecified: Secondary | ICD-10-CM | POA: Insufficient documentation

## 2013-02-16 DIAGNOSIS — I10 Essential (primary) hypertension: Secondary | ICD-10-CM | POA: Insufficient documentation

## 2013-02-16 DIAGNOSIS — M5431 Sciatica, right side: Secondary | ICD-10-CM

## 2013-02-16 DIAGNOSIS — Z9889 Other specified postprocedural states: Secondary | ICD-10-CM | POA: Insufficient documentation

## 2013-02-16 MED ORDER — METHYLPREDNISOLONE SODIUM SUCC 125 MG IJ SOLR
125.0000 mg | Freq: Once | INTRAMUSCULAR | Status: AC
  Administered 2013-02-16: 125 mg via INTRAMUSCULAR
  Filled 2013-02-16: qty 2

## 2013-02-16 MED ORDER — OXYCODONE-ACETAMINOPHEN 10-325 MG PO TABS
1.0000 | ORAL_TABLET | Freq: Four times a day (QID) | ORAL | Status: DC | PRN
Start: 2013-02-16 — End: 2017-04-13

## 2013-02-16 MED ORDER — PREDNISONE 10 MG PO TABS: 20.0000 mg | ORAL_TABLET | Freq: Every day | ORAL | Status: DC

## 2013-02-16 MED ORDER — HYDROMORPHONE HCL PF 2 MG/ML IJ SOLN
2.0000 mg | Freq: Once | INTRAMUSCULAR | Status: AC
  Administered 2013-02-16: 2 mg via INTRAMUSCULAR
  Filled 2013-02-16: qty 1

## 2013-02-16 MED ORDER — ONDANSETRON 4 MG PO TBDP
4.0000 mg | ORAL_TABLET | Freq: Once | ORAL | Status: AC
  Administered 2013-02-16: 4 mg via ORAL
  Filled 2013-02-16: qty 1

## 2013-02-16 NOTE — ED Provider Notes (Signed)
History  This chart was scribed for Tony Lennert, MD by Bennett Scrape, ED Scribe. This patient was seen in room APA15/APA15 and the patient's care was started at 3:02 PM.  CSN: [id number]  Arrival date & time 02/16/13  1344   First MD Initiated Contact with Patient 02/16/13 1502      Chief Complaint  Patient presents with  . Sciatica  . Hypertension     Patient is a 62 y.o. male presenting with back pain. The history is provided by the patient. No language interpreter was used.  Back Pain Location:  Lumbar spine Radiates to:  R foot Onset quality:  Gradual Duration:  2 months Timing:  Constant Progression:  Worsening Chronicity:  Chronic Relieved by:  Lying down Worsened by:  Bending and movement Associated symptoms: no abdominal pain, no chest pain and no headaches     Tony Powell is a 62 y.o. male with a h/o DDD and lumbar disc surgery in 2012 who presents to the Emergency Department complaining of 2 months of gradual onset, gradually worsening, constant right-sided lower lumbar back pain that radiates down into his right posterior buttocks and right leg to his first two right toes. He was seen in the ED 2 weeks ago for the same, had improvement with steroid injection and was prescribed prednisone and 5-325 mg oxycodone. He states that he finished the prednisone prescription with moderate improvement but states that 5 mg oxycodone did not improve his symptoms because he was originally on 10 mg. He denies f/u with a specialist or PCP currently stating "I got to make some money first".  He reports prior episodes of the same after his last lumbar disc surgery in 2012 stating "the surgeon aggravated the nerve". His BP is 203/103 in the ED currently. He denies having a h/o HTN, denies being on daily HTN medications and states that his BP is elevated due to pain and insomnia secondary to pain for the past 2 to 3 days. He denies saddle numbness, urinary or bowel incontinence, fever and  chills as associated symptoms. He is a former smoker but denies alcohol use.    Past Medical History  Diagnosis Date  . Abnormal stress test   . Chest discomfort     Atypical  . Borderline hypertension   . DJD (degenerative joint disease)     Knees, lumbar sacral spine  . Hyperlipidemia     Past Surgical History  Procedure Laterality Date  . Back surgery      Family History  Problem Relation Age of Onset  . Heart attack Mother   . Breast cancer Mother     Mastectomy  . Leukemia Father     History  Substance Use Topics  . Smoking status: Former Smoker -- 1.00 packs/day for 20 years    Quit date: 11/13/1998  . Smokeless tobacco: Not on file  . Alcohol Use: No      Review of Systems  Constitutional: Negative for fatigue.  HENT: Negative for congestion, sinus pressure and ear discharge.   Eyes: Negative for discharge.  Respiratory: Negative for cough.   Cardiovascular: Negative for chest pain.  Gastrointestinal: Negative for abdominal pain and diarrhea.  Genitourinary: Negative for frequency and hematuria.  Musculoskeletal: Positive for back pain.  Skin: Negative for rash.  Neurological: Negative for seizures and headaches.  Psychiatric/Behavioral: Negative for hallucinations.    Allergies  Review of patient's allergies indicates no known allergies.  Home Medications   Current Outpatient Rx  Name  Route  Sig  Dispense  Refill  . ibuprofen (ADVIL,MOTRIN) 200 MG tablet   Oral   Take 600 mg by mouth 4 (four) times daily as needed for pain.         . naproxen sodium (ALEVE) 220 MG tablet   Oral   Take 660 mg by mouth daily as needed (for pain).         Marland Kitchen oxyCODONE-acetaminophen (PERCOCET) 10-325 MG per tablet   Oral   Take 0.5-1 tablets by mouth every 4 (four) hours as needed for pain.         Marland Kitchen oxyCODONE-acetaminophen (PERCOCET/ROXICET) 5-325 MG per tablet   Oral   Take 1-2 tablets by mouth every 6 (six) hours as needed for pain.   30 tablet    0   . predniSONE (DELTASONE) 20 MG tablet      3 po once a day for 2 days, then 2 po once a day for 3 days, then 1 po once a day for 3 days   15 tablet   0   . sildenafil (VIAGRA) 100 MG tablet   Oral   Take 50 mg by mouth as directed.           . sildenafil (VIAGRA) 100 MG tablet   Oral   Take 1 tablet (100 mg total) by mouth daily as needed for erectile dysfunction.   3 tablet   0   . verapamil (CALAN-SR) 240 MG CR tablet   Oral   Take 240 mg by mouth at bedtime.           Triage Vitals: BP 203/103  Pulse 94  Temp(Src) 97.7 F (36.5 C) (Oral)  Resp 18  Ht 5\' 10"  (1.778 m)  Wt 236 lb (107.049 kg)  BMI 33.86 kg/m2  SpO2 98%  Physical Exam  Nursing note and vitals reviewed. Constitutional: He is oriented to person, place, and time. He appears well-developed and well-nourished.  HENT:  Head: Normocephalic and atraumatic.  Eyes: Conjunctivae are normal.  Neck: No tracheal deviation present.  Cardiovascular: Normal rate.   No murmur heard. Pulmonary/Chest: Effort normal.  Musculoskeletal: Normal range of motion.  Tender over right buttocks, positive SLR on the right, no decreased strength   Neurological: He is alert and oriented to person, place, and time.  Skin: Skin is warm and dry.  Psychiatric: He has a normal mood and affect. His behavior is normal.    ED Course  Procedures (including critical care time)  DIAGNOSTIC STUDIES: Oxygen Saturation is 98% on room air, normal by my interpretation.    COORDINATION OF CARE: 3:13 PM-Discussed treatment plan which includes pain medication with pt at bedside and pt agreed to plan.   3:15 PM- Ordered 2 mg Dilaudid and 4 mg Zofran injections  4:32 PM- Pt rechecked and is ambulating around the room with a limp. He states that the medications ordered above are improving his symptoms. Pt states that he has been f/u with Dr. Dutch Quint with his symptoms and receiving steroid injections with improvement. Last appointment  was last week. He also c/o difficulty urinating and weak stream. Advised pt that the urinary symptoms are related to his prostate. Discussed discharge instructions which include prednisone prescription with pt and pt agreed to plan. Will provide urology referral for pt to f/u with.  Labs Reviewed - No data to display No results found.   No diagnosis found.   Pt improved with tx MDM    The chart was  scribed for me under my direct supervision.  I personally performed the history, physical, and medical decision making and all procedures in the evaluation of this patient.Tony Lennert, MD 02/16/13 1640

## 2013-02-16 NOTE — ED Notes (Signed)
Patient c/o right buttock pain that radiates into right leg x1 month. Per patient seen here 2 weeks ago and got a shot of medication with no relief.

## 2013-04-16 DIAGNOSIS — M5126 Other intervertebral disc displacement, lumbar region: Secondary | ICD-10-CM | POA: Insufficient documentation

## 2016-12-04 DIAGNOSIS — Y92410 Unspecified street and highway as the place of occurrence of the external cause: Secondary | ICD-10-CM | POA: Diagnosis not present

## 2016-12-04 DIAGNOSIS — Z888 Allergy status to other drugs, medicaments and biological substances status: Secondary | ICD-10-CM | POA: Diagnosis not present

## 2016-12-04 DIAGNOSIS — S134XXA Sprain of ligaments of cervical spine, initial encounter: Secondary | ICD-10-CM | POA: Diagnosis not present

## 2016-12-06 DIAGNOSIS — S161XXA Strain of muscle, fascia and tendon at neck level, initial encounter: Secondary | ICD-10-CM | POA: Diagnosis not present

## 2016-12-11 DIAGNOSIS — M542 Cervicalgia: Secondary | ICD-10-CM | POA: Diagnosis not present

## 2016-12-11 DIAGNOSIS — M5413 Radiculopathy, cervicothoracic region: Secondary | ICD-10-CM | POA: Diagnosis not present

## 2016-12-11 DIAGNOSIS — S161XXD Strain of muscle, fascia and tendon at neck level, subsequent encounter: Secondary | ICD-10-CM | POA: Diagnosis not present

## 2016-12-13 DIAGNOSIS — M5413 Radiculopathy, cervicothoracic region: Secondary | ICD-10-CM | POA: Diagnosis not present

## 2016-12-13 DIAGNOSIS — S161XXD Strain of muscle, fascia and tendon at neck level, subsequent encounter: Secondary | ICD-10-CM | POA: Diagnosis not present

## 2016-12-13 DIAGNOSIS — M542 Cervicalgia: Secondary | ICD-10-CM | POA: Diagnosis not present

## 2016-12-25 DIAGNOSIS — S161XXD Strain of muscle, fascia and tendon at neck level, subsequent encounter: Secondary | ICD-10-CM | POA: Diagnosis not present

## 2016-12-25 DIAGNOSIS — M5413 Radiculopathy, cervicothoracic region: Secondary | ICD-10-CM | POA: Diagnosis not present

## 2016-12-25 DIAGNOSIS — M542 Cervicalgia: Secondary | ICD-10-CM | POA: Diagnosis not present

## 2016-12-27 DIAGNOSIS — M542 Cervicalgia: Secondary | ICD-10-CM | POA: Diagnosis not present

## 2016-12-27 DIAGNOSIS — M5413 Radiculopathy, cervicothoracic region: Secondary | ICD-10-CM | POA: Diagnosis not present

## 2016-12-27 DIAGNOSIS — S161XXD Strain of muscle, fascia and tendon at neck level, subsequent encounter: Secondary | ICD-10-CM | POA: Diagnosis not present

## 2017-01-03 DIAGNOSIS — S161XXD Strain of muscle, fascia and tendon at neck level, subsequent encounter: Secondary | ICD-10-CM | POA: Diagnosis not present

## 2017-01-03 DIAGNOSIS — M542 Cervicalgia: Secondary | ICD-10-CM | POA: Diagnosis not present

## 2017-01-03 DIAGNOSIS — M5413 Radiculopathy, cervicothoracic region: Secondary | ICD-10-CM | POA: Diagnosis not present

## 2017-01-08 DIAGNOSIS — S161XXD Strain of muscle, fascia and tendon at neck level, subsequent encounter: Secondary | ICD-10-CM | POA: Diagnosis not present

## 2017-01-16 DIAGNOSIS — M9981 Other biomechanical lesions of cervical region: Secondary | ICD-10-CM | POA: Diagnosis not present

## 2017-01-16 DIAGNOSIS — S161XXD Strain of muscle, fascia and tendon at neck level, subsequent encounter: Secondary | ICD-10-CM | POA: Diagnosis not present

## 2017-01-24 DIAGNOSIS — M5413 Radiculopathy, cervicothoracic region: Secondary | ICD-10-CM | POA: Diagnosis not present

## 2017-01-24 DIAGNOSIS — S161XXD Strain of muscle, fascia and tendon at neck level, subsequent encounter: Secondary | ICD-10-CM | POA: Diagnosis not present

## 2017-01-24 DIAGNOSIS — M542 Cervicalgia: Secondary | ICD-10-CM | POA: Diagnosis not present

## 2017-01-29 DIAGNOSIS — M9981 Other biomechanical lesions of cervical region: Secondary | ICD-10-CM | POA: Diagnosis not present

## 2017-01-29 DIAGNOSIS — S161XXD Strain of muscle, fascia and tendon at neck level, subsequent encounter: Secondary | ICD-10-CM | POA: Diagnosis not present

## 2017-02-01 DIAGNOSIS — M5413 Radiculopathy, cervicothoracic region: Secondary | ICD-10-CM | POA: Diagnosis not present

## 2017-02-01 DIAGNOSIS — S161XXD Strain of muscle, fascia and tendon at neck level, subsequent encounter: Secondary | ICD-10-CM | POA: Diagnosis not present

## 2017-02-01 DIAGNOSIS — M5412 Radiculopathy, cervical region: Secondary | ICD-10-CM | POA: Diagnosis not present

## 2017-02-05 DIAGNOSIS — M9981 Other biomechanical lesions of cervical region: Secondary | ICD-10-CM | POA: Diagnosis not present

## 2017-02-05 DIAGNOSIS — M5412 Radiculopathy, cervical region: Secondary | ICD-10-CM | POA: Diagnosis not present

## 2017-02-05 DIAGNOSIS — I10 Essential (primary) hypertension: Secondary | ICD-10-CM | POA: Diagnosis not present

## 2017-02-05 DIAGNOSIS — S161XXD Strain of muscle, fascia and tendon at neck level, subsequent encounter: Secondary | ICD-10-CM | POA: Diagnosis not present

## 2017-02-07 DIAGNOSIS — S161XXD Strain of muscle, fascia and tendon at neck level, subsequent encounter: Secondary | ICD-10-CM | POA: Diagnosis not present

## 2017-02-07 DIAGNOSIS — M5412 Radiculopathy, cervical region: Secondary | ICD-10-CM | POA: Diagnosis not present

## 2017-02-07 DIAGNOSIS — M5413 Radiculopathy, cervicothoracic region: Secondary | ICD-10-CM | POA: Diagnosis not present

## 2017-02-13 DIAGNOSIS — M5412 Radiculopathy, cervical region: Secondary | ICD-10-CM | POA: Diagnosis not present

## 2017-02-13 DIAGNOSIS — S161XXD Strain of muscle, fascia and tendon at neck level, subsequent encounter: Secondary | ICD-10-CM | POA: Diagnosis not present

## 2017-02-13 DIAGNOSIS — M9981 Other biomechanical lesions of cervical region: Secondary | ICD-10-CM | POA: Diagnosis not present

## 2017-03-30 DIAGNOSIS — M9981 Other biomechanical lesions of cervical region: Secondary | ICD-10-CM | POA: Diagnosis not present

## 2017-03-30 DIAGNOSIS — S161XXD Strain of muscle, fascia and tendon at neck level, subsequent encounter: Secondary | ICD-10-CM | POA: Diagnosis not present

## 2017-03-30 DIAGNOSIS — Z79891 Long term (current) use of opiate analgesic: Secondary | ICD-10-CM | POA: Diagnosis not present

## 2017-04-13 ENCOUNTER — Encounter: Payer: Self-pay | Admitting: Family Medicine

## 2017-04-13 ENCOUNTER — Ambulatory Visit (INDEPENDENT_AMBULATORY_CARE_PROVIDER_SITE_OTHER): Payer: Medicare Other | Admitting: Family Medicine

## 2017-04-13 ENCOUNTER — Ambulatory Visit: Payer: Self-pay | Admitting: Family Medicine

## 2017-04-13 VITALS — BP 136/84 | Wt 254.4 lb

## 2017-04-13 DIAGNOSIS — I10 Essential (primary) hypertension: Secondary | ICD-10-CM | POA: Diagnosis not present

## 2017-04-13 MED ORDER — VERAPAMIL HCL ER 240 MG PO TBCR: 240.0000 mg | EXTENDED_RELEASE_TABLET | Freq: Every day | ORAL | 1 refills | Status: DC

## 2017-04-13 NOTE — Patient Instructions (Signed)
[name] arrives today for the first time in quite a while with elevated blood pressure  He no longer is on medicine for high blood pressure  Due to his levels (160 over 90 ish on repeat), he is agreeable to reinitiate medicine and we have strted Calan 240 SR equivalent today  This should get his blood pressure into a good range  Mickie Hillier MD

## 2017-04-13 NOTE — Progress Notes (Signed)
   Subjective:    Patient ID: Tony Powell, male    DOB: 1951-07-04, 66 y.o.   MRN: [id number]  HPI  Patient arrives for a blood pressure check.   His pain doctor will not give him his back injection because his blood pressure was very high that day.  Patient arrives after a nearly 5 year hiatus. At one point we had him on blood pressure medication. He came off this. He is been going elsewhere for his healthcare including DOT checkups./[employer]  Positive family history of high blood pressure.  Reports fair compliance with diet and exercise.   Pt had Review of Systems No headache, no major weight loss or weight gain, no chest pain no back pain abdominal pain no change in bowel habits complete ROS otherwise negative     Objective:   Physical Exam  Alert vitals stable, NAD. Blood pressure good on repeat. HEENT normal. Lungs clear. Heart regular rate and rhythm. Blood pressure on repeat 875 systolic over 92 diastolic      Assessment & Plan:  Impression hypertension with history of noncompliance plan reinitiate medication. Rationale discussed diet exercise discussed recheck in 6 months. Patient declines preventative checkup recommendations at this time [facility]

## 2017-10-03 ENCOUNTER — Ambulatory Visit (INDEPENDENT_AMBULATORY_CARE_PROVIDER_SITE_OTHER): Payer: Medicare Other | Admitting: Family Medicine

## 2017-10-03 ENCOUNTER — Encounter: Payer: Self-pay | Admitting: Family Medicine

## 2017-10-03 VITALS — BP 142/84 | Ht 70.0 in | Wt 259.0 lb

## 2017-10-03 DIAGNOSIS — I10 Essential (primary) hypertension: Secondary | ICD-10-CM

## 2017-10-03 MED ORDER — VERAPAMIL HCL ER 240 MG PO TBCR
240.0000 mg | EXTENDED_RELEASE_TABLET | Freq: Every day | ORAL | 1 refills | Status: DC
Start: 2017-10-03 — End: 2018-12-17

## 2017-10-03 NOTE — Progress Notes (Signed)
   Subjective:    Patient ID: Tony Powell, male    DOB: 1951-09-11, 66 y.o.   MRN: [id number]  Hypertension  This is a chronic problem. Treatments tried: verapamil. There are no compliance problems (takes meds every day, walks for exercise and eats healthy).    Declines flu vaccine.   Blood pressure medicine and blood pressure levels reviewed today with patient. Compliant with blood pressure medicine. States does not miss a dose. No obvious side effects. Blood pressure generally good when checked elsewhere. Watching salt intake.   138 over 82  Review of Systems No headache, no major weight loss or weight gain, no chest pain no back pain abdominal pain no change in bowel habits complete ROS otherwise negative     Objective:   Physical Exam   Alert vitals stable, NAD. Blood pressure good on repeat. HEENT normal. Lungs clear. Heart regular rate and rhythm.      Assessment & Plan:  Impression #1 hypertension.  Compliant with medications.  Does not check blood pressure elsewhere.  Trying to watch salt intake.  Medications refilled.  Diet exercise discussed.  Patient declines once again all additional tests such as blood work preventive checkups vaccines etc.

## 2018-06-17 ENCOUNTER — Other Ambulatory Visit: Payer: Self-pay

## 2018-12-16 ENCOUNTER — Telehealth: Payer: Self-pay | Admitting: *Deleted

## 2018-12-16 NOTE — Telephone Encounter (Signed)
Pt called back for an appt for tomorrow. Since pt was seen in 2018 he is still a current pt for [place]. Scheduled pt an appt.

## 2018-12-16 NOTE — Telephone Encounter (Signed)
Tony Powell was your patient in 2018 but he has no physician now in system someone took it out. He was last seen here for 6 month ago for follow up on 10/03/2017. I told him I would have to check with physician first before scheduling appointment. He wanted to be seen for sick visit.

## 2018-12-17 ENCOUNTER — Encounter: Payer: Self-pay | Admitting: Family Medicine

## 2018-12-17 ENCOUNTER — Ambulatory Visit (INDEPENDENT_AMBULATORY_CARE_PROVIDER_SITE_OTHER): Payer: Medicare Other | Admitting: Family Medicine

## 2018-12-17 VITALS — BP 138/78 | Temp 97.8°F | Wt 250.0 lb

## 2018-12-17 DIAGNOSIS — Z125 Encounter for screening for malignant neoplasm of prostate: Secondary | ICD-10-CM | POA: Diagnosis not present

## 2018-12-17 DIAGNOSIS — J329 Chronic sinusitis, unspecified: Secondary | ICD-10-CM

## 2018-12-17 DIAGNOSIS — I10 Essential (primary) hypertension: Secondary | ICD-10-CM

## 2018-12-17 DIAGNOSIS — Z13220 Encounter for screening for lipoid disorders: Secondary | ICD-10-CM

## 2018-12-17 DIAGNOSIS — N5201 Erectile dysfunction due to arterial insufficiency: Secondary | ICD-10-CM | POA: Diagnosis not present

## 2018-12-17 MED ORDER — SILDENAFIL CITRATE 100 MG PO TABS: ORAL_TABLET | ORAL | 5 refills | Status: DC

## 2018-12-17 MED ORDER — AMOXICILLIN-POT CLAVULANATE 875-125 MG PO TABS: ORAL_TABLET | ORAL | 0 refills | Status: DC

## 2018-12-17 MED ORDER — VERAPAMIL HCL ER 240 MG PO TBCR: 240.0000 mg | EXTENDED_RELEASE_TABLET | Freq: Every day | ORAL | 1 refills | Status: DC

## 2018-12-17 NOTE — Patient Instructions (Addendum)
Rohn, get your blood work!    We'll let you know the results, see you in six months!

## 2018-12-17 NOTE — Progress Notes (Signed)
   Subjective:    Patient ID: Tony Powell, male    DOB: 06-06-1951, 68 y.o.   MRN: [id number]  Headache   This is a new problem. The current episode started in the past 7 days (came on last Tuesday but became worse on Friday). Associated symptoms include coughing. Associated symptoms comments: Wheezing, sinus pressure. Treatments tried: Nyquil. The treatment provided moderate relief.   Blood pressure medicine and blood pressure levels reviewed today with patient. Compliant with blood pressure medicine. States does not miss a dose. No obvious side effects. Blood pressure generally good when checked elsewhere. Watching salt intake.  BP usually better  Sick, kicked in Friday morn,, was exposed to sickness in the days prior  Coughing off and on, bad at times  Felt very achey mus les and joints, whole body aching bad  Ongoing frontal and nasal headaches, worse after blowing nose, pos gunky discharge   Patient has ongoing challenges with erectile dysfunction.  Viagra works well.  Requesting refills.  No major side effects   Review of Systems  Respiratory: Positive for cough.   Neurological: Positive for headaches.       Objective:   Physical Exam  Alert, mild malaise. Hydration good Vitals stable +++. frontal/ maxillary tenderness evident positive nasal congestion. pharynx normal neck supple  lungs clear/no crackles or wheezes. heart regular in rhythm  Neck-symmetric, no masses; thyroid nonpalpable and nontender Pulmonary-no tachypnea or accessory muscle use; Clear without wheezes via auscultation Card--no abnrml murmurs, rhythm reg and rate WNL Carotid pulses symmetric, without bruits       Assessment & Plan:  Impression 1 rhinosinusitis.  Likely post viral.  Even potentially post flu discussed.  Antibiotics prescribed  2.  Hypertension.  Decent control blood pressure improved on repeat to maintain same dose of meds also time for yearly screening blood work strongly  encouraged.  3.  Erectile dysfunction ongoing.  Meds helpful will refill  Follow-up in 6 months  Further recommendations based on blood work  Greater than 50% of this 25 minute face to face visit was spent in counseling and discussion and coordination of care regarding the above diagnosis/diagnosies

## 2018-12-18 DIAGNOSIS — Z13220 Encounter for screening for lipoid disorders: Secondary | ICD-10-CM | POA: Diagnosis not present

## 2018-12-18 DIAGNOSIS — I10 Essential (primary) hypertension: Secondary | ICD-10-CM | POA: Diagnosis not present

## 2018-12-18 DIAGNOSIS — Z125 Encounter for screening for malignant neoplasm of prostate: Secondary | ICD-10-CM | POA: Diagnosis not present

## 2018-12-19 LAB — LIPID PANEL
Chol/HDL Ratio: 5.7 ratio — ABNORMAL HIGH (ref 0.0–5.0)
Cholesterol, Total: 226 mg/dL — ABNORMAL HIGH (ref 100–199)
HDL: 40 mg/dL (ref >39)
LDL Calculated: 153 mg/dL — ABNORMAL HIGH (ref 0–99)
TRIGLYCERIDES: 167 mg/dL — AB (ref 0–149)
VLDL Cholesterol Cal: 33 mg/dL (ref 5–40)

## 2018-12-19 LAB — BASIC METABOLIC PANEL
BUN/Creatinine Ratio: 9 — ABNORMAL LOW (ref 10–24)
BUN: 10 mg/dL (ref 8–27)
CALCIUM: 9.2 mg/dL (ref 8.6–10.2)
CHLORIDE: 103 mmol/L (ref 96–106)
CO2: 21 mmol/L (ref 20–29)
Creatinine, Ser: 1.07 mg/dL (ref 0.76–1.27)
GFR calc Af Amer: 83 mL/min/{1.73_m2} (ref >59)
GFR calc non Af Amer: 71 mL/min/{1.73_m2} (ref >59)
Glucose: 98 mg/dL (ref 65–99)
POTASSIUM: 4.3 mmol/L (ref 3.5–5.2)
SODIUM: 143 mmol/L (ref 134–144)

## 2018-12-19 LAB — CBC WITH DIFFERENTIAL/PLATELET
Basophils Absolute: 0 10*3/uL (ref 0.0–0.2)
Basos: 0 %
EOS (ABSOLUTE): 0.1 10*3/uL (ref 0.0–0.4)
EOS: 2 %
Hematocrit: 42.5 % (ref 37.5–51.0)
Hemoglobin: 13.6 g/dL (ref 13.0–17.7)
Immature Grans (Abs): 0 10*3/uL (ref 0.0–0.1)
Immature Granulocytes: 0 %
LYMPHS: 37 %
Lymphocytes Absolute: 1.9 10*3/uL (ref 0.7–3.1)
MCH: 26 pg — ABNORMAL LOW (ref 26.6–33.0)
MCHC: 32 g/dL (ref 31.5–35.7)
MCV: 81 fL (ref 79–97)
Monocytes Absolute: 0.4 10*3/uL (ref 0.1–0.9)
Monocytes: 8 %
NEUTROS PCT: 53 %
Neutrophils Absolute: 2.6 10*3/uL (ref 1.4–7.0)
PLATELETS: 239 10*3/uL (ref 150–450)
RBC: 5.23 x10E6/uL (ref 4.14–5.80)
RDW: 13.7 % (ref 11.6–15.4)
WBC: 5 10*3/uL (ref 3.4–10.8)

## 2018-12-19 LAB — HEPATIC FUNCTION PANEL
ALK PHOS: 64 IU/L (ref 39–117)
ALT: 20 IU/L (ref 0–44)
AST: 26 IU/L (ref 0–40)
Albumin: 4.7 g/dL (ref 3.8–4.8)
BILIRUBIN TOTAL: 0.7 mg/dL (ref 0.0–1.2)
BILIRUBIN, DIRECT: 0.14 mg/dL (ref 0.00–0.40)
Total Protein: 7.1 g/dL (ref 6.0–8.5)

## 2018-12-19 LAB — PSA: PROSTATE SPECIFIC AG, SERUM: 1.5 ng/mL (ref 0.0–4.0)

## 2018-12-22 ENCOUNTER — Encounter: Payer: Self-pay | Admitting: Family Medicine

## 2019-08-29 ENCOUNTER — Telehealth: Payer: Self-pay | Admitting: Family Medicine

## 2019-08-29 MED ORDER — SILDENAFIL CITRATE 100 MG PO TABS: ORAL_TABLET | ORAL | 0 refills | Status: DC

## 2019-08-29 NOTE — Telephone Encounter (Signed)
Patient has an appt in a couple weeks for med refills but he is out of Viagra and would like a refill.  He said he can't afford to get many so he wants #10 and write it "dispense until filled" so he can get 1 or 2 at a time.  [place]

## 2019-08-29 NOTE — Telephone Encounter (Signed)
geez i'm sure the pharmacist loves that. Ok.

## 2019-08-29 NOTE — Telephone Encounter (Signed)
Please advise. Thank you

## 2019-08-29 NOTE — Telephone Encounter (Signed)
Medication sent in and pt is aware  

## 2019-09-12 ENCOUNTER — Ambulatory Visit: Payer: Medicare Other | Admitting: Family Medicine

## 2019-10-07 ENCOUNTER — Other Ambulatory Visit: Payer: Self-pay | Admitting: Family Medicine

## 2019-10-07 NOTE — Telephone Encounter (Signed)
Call and sched virt visit, then may refill

## 2019-10-08 ENCOUNTER — Other Ambulatory Visit: Payer: Self-pay

## 2019-10-08 NOTE — Telephone Encounter (Signed)
Patent scheduled appointment on 11/27 for his medication follow up.

## 2019-10-10 ENCOUNTER — Encounter: Payer: Self-pay | Admitting: Family Medicine

## 2019-10-10 ENCOUNTER — Ambulatory Visit (INDEPENDENT_AMBULATORY_CARE_PROVIDER_SITE_OTHER): Payer: Medicare Other | Admitting: Family Medicine

## 2019-10-10 ENCOUNTER — Other Ambulatory Visit: Payer: Self-pay

## 2019-10-10 DIAGNOSIS — I10 Essential (primary) hypertension: Secondary | ICD-10-CM | POA: Diagnosis not present

## 2019-10-10 MED ORDER — VERAPAMIL HCL ER 240 MG PO TBCR: EXTENDED_RELEASE_TABLET | ORAL | 1 refills | Status: DC

## 2019-10-10 MED ORDER — SILDENAFIL CITRATE 100 MG PO TABS: ORAL_TABLET | ORAL | 5 refills | Status: DC

## 2019-10-10 NOTE — Progress Notes (Signed)
   Subjective:  Audio plus video  Patient ID: Tony Powell, male    DOB: 06-19-1951, 68 y.o.   MRN: [id number]  Hypertension This is a chronic problem. The current episode started more than 1 year ago. Risk factors for coronary artery disease include male gender. Treatments tried: verapamil. There are no compliance problems.       Review of Systems Virtual Visit via Video Note  I connected with Tony Powell on 10/10/19 at  9:30 AM EST by a video enabled telemedicine application and verified that I am speaking with the correct person using two identifiers.  Location: Patient: home Provider: office   I discussed the limitations of evaluation and management by telemedicine and the availability of in person appointments. The patient expressed understanding and agreed to proceed.  History of Present Illness:    Observations/Objective:   Assessment and Plan:   Follow Up Instructions:    I discussed the assessment and treatment plan with the patient. The patient was provided an opportunity to ask questions and all were answered. The patient agreed with the plan and demonstrated an understanding of the instructions.   The patient was advised to call back or seek an in-person evaluation if the symptoms worsen or if the condition fails to improve as anticipated.  I provided 18 minutes of non-face-to-face time during this encounter.  Blood pressure medicine and blood pressure levels reviewed today with patient. Compliant with blood pressure medicine. States does not miss a dose. No obvious side effects. Blood pressure generally good when checked elsewhere. Watching salt intake.       Objective:   Physical Exam  Virtual      Assessment & Plan:  Impression hypertension.  Currently controlled discussed maintain same meds compliance discussed  2.  Erectile dysfunction.  Would prefer to stick with generic sildenafil.  This is refilled  Follow-up in 6 months diet exercise  discussed flu shot declined

## 2019-12-18 ENCOUNTER — Encounter: Payer: Self-pay | Admitting: Family Medicine

## 2020-02-10 ENCOUNTER — Other Ambulatory Visit: Payer: Self-pay

## 2020-02-10 ENCOUNTER — Telehealth: Payer: Self-pay | Admitting: Family Medicine

## 2020-02-10 ENCOUNTER — Encounter: Payer: Self-pay | Admitting: Family Medicine

## 2020-02-10 ENCOUNTER — Ambulatory Visit (INDEPENDENT_AMBULATORY_CARE_PROVIDER_SITE_OTHER): Payer: Medicare Other | Admitting: Family Medicine

## 2020-02-10 VITALS — BP 160/98 | Temp 98.1°F | Ht 70.0 in | Wt 246.0 lb

## 2020-02-10 DIAGNOSIS — I10 Essential (primary) hypertension: Secondary | ICD-10-CM | POA: Diagnosis not present

## 2020-02-10 DIAGNOSIS — R350 Frequency of micturition: Secondary | ICD-10-CM

## 2020-02-10 DIAGNOSIS — N410 Acute prostatitis: Secondary | ICD-10-CM

## 2020-02-10 LAB — POCT URINALYSIS DIPSTICK
Spec Grav, UA: <=1.005 — AB (ref 1.010–1.025)
pH, UA: 5 (ref 5.0–8.0)

## 2020-02-10 MED ORDER — CIPROFLOXACIN HCL 500 MG PO TABS: 500.0000 mg | ORAL_TABLET | Freq: Two times a day (BID) | ORAL | 0 refills | Status: DC

## 2020-02-10 MED ORDER — VERAPAMIL HCL ER 240 MG PO TBCR: EXTENDED_RELEASE_TABLET | ORAL | 1 refills | Status: DC

## 2020-02-10 NOTE — Telephone Encounter (Signed)
Left message to return call 

## 2020-02-10 NOTE — Telephone Encounter (Signed)
Pt states he's ready for a referral for colonoscopy  Also pt states he's having "urine" problems and would like referral to urology   Explained to pt he may need a visit here for these referrals, pt requested that I ask Dr. Richardson Landry  NTBS or refer - please advise & call pt - per pt OK to [facility]

## 2020-02-10 NOTE — Telephone Encounter (Signed)
Pt returned call and states that he has been having increased urination for a couple of months, but has recently became worse. Pt states he was up all night on Thursday urinating. No able to sleep. Pt states that last night he know he urinated over a half gallon of water. Spoke with provider; provider states we can see him in the next open slot. Pt transferred up front to schedule appt.

## 2020-02-10 NOTE — Progress Notes (Signed)
   Subjective:    Patient ID: Tony Powell, male    DOB: [birth year], 69 y.o.   MRN: [id number]  Urinary Frequency  This is a new problem. Episode onset: 2 weeks. Associated symptoms include frequency. Associated symptoms comments: Burning with urination.   Results for orders placed or performed in visit on 02/10/20  POCT urinalysis dipstick  Result Value Ref Range   Color, UA     Clarity, UA     Glucose, UA     Bilirubin, UA     Ketones, UA     Spec Grav, UA <=1.005 (A) 1.010 - 1.025   Blood, UA     pH, UA 5.0 5.0 - 8.0   Protein, UA     Urobilinogen, UA     Nitrite, UA     Leukocytes, UA Trace (A) Negative   Appearance     Odor     Pos incr frequency  Pos nocturia    Review of Systems  Genitourinary: Positive for frequency.       Objective:   Physical Exam  Alert vitals stable, NAD. Blood pressure good on repeat. HEENT normal. Lungs clear. Heart regular rate and rhythm. Prostate follow-up somewhat tender  Urinalysis occasional white blood cell      Assessment & Plan:  Impression acute prostatitis.  PSA excellent last year.  Will cover with Cipro twice daily for 21 days.  Symptom care discussed  2.  Hypertension.  Overall decent control blood pressure improved on repeat to maintain same meds diet exercise discussed  Follow-up every 6 months

## 2020-02-11 ENCOUNTER — Encounter: Payer: Self-pay | Admitting: Family Medicine

## 2020-02-23 ENCOUNTER — Telehealth: Payer: Self-pay | Admitting: Family Medicine

## 2020-02-23 MED ORDER — TAMSULOSIN HCL 0.4 MG PO CAPS: 0.4000 mg | ORAL_CAPSULE | Freq: Every day | ORAL | 0 refills | Status: DC

## 2020-02-23 NOTE — Telephone Encounter (Signed)
Prescription sent electronically to pharmacy. Patient notified. 

## 2020-02-23 NOTE — Telephone Encounter (Signed)
That why we give three weeks. Cst and Add flomax 0.4 mg one qhs for 30 d

## 2020-02-23 NOTE — Telephone Encounter (Signed)
Pt states he has been taking ciprofloxacin (CIPRO) 500 MG tablet and has not seen any change. He is still urinating frequently.

## 2020-03-02 ENCOUNTER — Telehealth: Payer: Self-pay | Admitting: Family Medicine

## 2020-03-02 MED ORDER — CIPROFLOXACIN HCL 500 MG PO TABS: 500.0000 mg | ORAL_TABLET | Freq: Two times a day (BID) | ORAL | 0 refills | Status: DC

## 2020-03-02 NOTE — Telephone Encounter (Signed)
Ok may ref, let him know if this does not take care of , will send to a urlogist

## 2020-03-02 NOTE — Telephone Encounter (Signed)
Patient requesting refill on Cipro. Michela Pitcher he was finally getting better but finished his last dose and now the frequent urination is coming back.  [facility] [place]

## 2020-03-02 NOTE — Telephone Encounter (Signed)
Medication sent to pharmacy. Left message to return call 

## 2020-03-02 NOTE — Telephone Encounter (Signed)
Contacted patient.Pt states his is not having any other symptoms. Pt states that the frequent urination began this morning. He has been going a little more that usual. Pt finished the Cipro. Please advise. Thank you

## 2020-03-05 NOTE — Telephone Encounter (Signed)
Pt contacted and pt has picked up medication

## 2020-03-22 ENCOUNTER — Telehealth: Payer: Self-pay | Admitting: Family Medicine

## 2020-03-22 DIAGNOSIS — R350 Frequency of micturition: Secondary | ICD-10-CM

## 2020-03-22 MED ORDER — CIPROFLOXACIN HCL 500 MG PO TABS: 500.0000 mg | ORAL_TABLET | Freq: Two times a day (BID) | ORAL | 0 refills | Status: DC

## 2020-03-22 MED ORDER — TAMSULOSIN HCL 0.4 MG PO CAPS: 0.4000 mg | ORAL_CAPSULE | Freq: Every day | ORAL | 0 refills | Status: DC

## 2020-03-22 NOTE — Telephone Encounter (Signed)
Prescriptions sent electronically to pharmacy. Referral ordered in Epic. Patient notified and verbalized understanding.

## 2020-03-22 NOTE — Telephone Encounter (Signed)
May refill, definitely time for a urology referral let pt know this. [facility]

## 2020-03-22 NOTE — Telephone Encounter (Signed)
tamsulosin (FLOMAX) 0.4 MG CAPS capsule  ciprofloxacin (CIPRO) 500 MG tablet   Pt would like a refill on these medications and would like a referral for a urology if the problem continues. When taking the medication it helps the problem but with out the medication it is a noticeable increase in frequency.   [place], [place]

## 2020-03-29 ENCOUNTER — Encounter: Payer: Self-pay | Admitting: Family Medicine

## 2020-04-08 ENCOUNTER — Telehealth: Payer: Self-pay | Admitting: Family Medicine

## 2020-04-08 ENCOUNTER — Other Ambulatory Visit: Payer: Self-pay | Admitting: *Deleted

## 2020-04-08 MED ORDER — CIPROFLOXACIN HCL 500 MG PO TABS: 500.0000 mg | ORAL_TABLET | Freq: Two times a day (BID) | ORAL | 0 refills | Status: DC

## 2020-04-08 MED ORDER — TAMSULOSIN HCL 0.4 MG PO CAPS: 0.4000 mg | ORAL_CAPSULE | Freq: Every day | ORAL | 0 refills | Status: DC

## 2020-04-08 NOTE — Telephone Encounter (Signed)
Pt would like refill on tamsulosin (FLOMAX) 0.4 MG CAPS capsule and ciprofloxacin (CIPRO) 500 MG tablet    [place], [place]  Pt also wanted to pass along to Dr. Richardson Landry he wishes him well and to enjoy his journey.

## 2020-04-08 NOTE — Telephone Encounter (Signed)
Refills sent and pt was notified.  

## 2020-04-08 NOTE — Telephone Encounter (Signed)
Pt seen 3/30 for urinary frequency and had several refills called in and referral to urology put in on 5/10. Pt states he received letter to call urologist office and he states he will do that. Symptoms came back 4 days ago. Urinary frequency.

## 2020-04-08 NOTE — Telephone Encounter (Signed)
Thx. May ref both times one

## 2020-04-13 ENCOUNTER — Telehealth: Payer: Self-pay | Admitting: Family Medicine

## 2020-04-13 NOTE — Telephone Encounter (Signed)
Patient is requesting appointment at [facility] in [place] instead of [place].Please Advise

## 2020-04-14 NOTE — Telephone Encounter (Signed)
Pt is trying to get switched to [facility] in [place] instead of [place] he has not been able to get in touch with anyone at the [place] location.

## 2020-04-21 NOTE — Telephone Encounter (Signed)
Spoke with pt, explained that we sent referral to [facility] & their office will contact pt, pt verbalized understanding

## 2020-04-30 ENCOUNTER — Encounter: Payer: Self-pay | Admitting: Family Medicine

## 2020-04-30 ENCOUNTER — Ambulatory Visit (INDEPENDENT_AMBULATORY_CARE_PROVIDER_SITE_OTHER): Payer: Medicare Other | Admitting: Family Medicine

## 2020-04-30 ENCOUNTER — Other Ambulatory Visit: Payer: Self-pay

## 2020-04-30 VITALS — BP 146/82 | HR 86 | Temp 97.8°F | Wt 252.4 lb

## 2020-04-30 DIAGNOSIS — R350 Frequency of micturition: Secondary | ICD-10-CM

## 2020-04-30 DIAGNOSIS — K649 Unspecified hemorrhoids: Secondary | ICD-10-CM

## 2020-04-30 LAB — POCT URINALYSIS DIPSTICK (MANUAL)
Leukocytes, UA: NEGATIVE
Nitrite, UA: NEGATIVE
Poct Bilirubin: NEGATIVE
Poct Blood: NEGATIVE
Poct Glucose: NORMAL mg/dL
Poct Ketones: NEGATIVE
Poct Protein: NEGATIVE mg/dL
Poct Urobilinogen: NORMAL mg/dL
Spec Grav, UA: 1.01 (ref 1.010–1.025)
pH, UA: 7.5 (ref 5.0–8.0)

## 2020-04-30 NOTE — Progress Notes (Signed)
Patient ID: Tony Powell, male    DOB: 06/23/1951, 69 y.o.   MRN: [id number]   Chief Complaint  Patient presents with  . Blood In Stools    patient complains of blood in stools twice in the last couple of weeks.   . Urinary Frequency    worsening urinary symptoms since starting flomax. Frequency, urgency, unable to hold, occasional incontinence and getting up at night to urinate.   Subjective:    HPI Pt having some blood in stools 3 days ago and has resolved.  A few drops of blood in the toilet.  No mass or lump in rectal area.  Has had h/o hemorrhoids when in his 24s from weight lifting. Didn't use anything for the bleeding.  Urinary frequency- feels it has worsened since starting flomax.  Having some occ incontinece and nocturia.  Pt was on 500mg  cipro bid for 21 days, pt unknown reason for this, but states it was for UTI.    Medical History Marisa has a past medical history of Abnormal stress test, Borderline hypertension, Chest discomfort, DJD (degenerative joint disease), and Hyperlipidemia.   Outpatient Encounter Medications as of 04/30/2020  Medication Sig  . naproxen sodium (ALEVE) 220 MG tablet Take 660 mg by mouth daily as needed (for pain).  . sildenafil (VIAGRA) 100 MG tablet Take one half to one tablet by mouth every day as needed for ED.  . tamsulosin (FLOMAX) 0.4 MG CAPS capsule Take 1 capsule (0.4 mg total) by mouth at bedtime.  . verapamil (CALAN-SR) 240 MG CR tablet TAKE ONE (1) TABLET (240 MG TOTAL) BY MOUTH AT BEDTIME.  . [DISCONTINUED] ciprofloxacin (CIPRO) 500 MG tablet Take 1 tablet (500 mg total) by mouth 2 (two) times daily.   No facility-administered encounter medications on file as of 04/30/2020.     Review of Systems  Constitutional: Negative for chills and fever.  Gastrointestinal: Positive for blood in stool. Negative for abdominal pain, constipation, diarrhea, nausea, rectal pain and vomiting.  Genitourinary: Positive for frequency. Negative for  decreased urine volume, difficulty urinating, dysuria, flank pain and hematuria.       +occ incontinence.     Vitals BP (!) 146/82   Pulse 86   Temp 97.8 F (36.6 C)   Wt 252 lb 6.4 oz (114.5 kg)   BMI 36.22 kg/m   Objective:   Physical Exam Vitals and nursing note reviewed.  Constitutional:      General: He is not in acute distress.    Appearance: Normal appearance. He is not ill-appearing.  HENT:     Head: Normocephalic.     Nose: Nose normal. No congestion.     Mouth/Throat:     Mouth: Mucous membranes are moist.     Pharynx: No oropharyngeal exudate.  Eyes:     Extraocular Movements: Extraocular movements intact.     Conjunctiva/sclera: Conjunctivae normal.     Pupils: Pupils are equal, round, and reactive to light.  Cardiovascular:     Rate and Rhythm: Normal rate and regular rhythm.     Pulses: Normal pulses.     Heart sounds: Normal heart sounds. No murmur heard.   Pulmonary:     Effort: Pulmonary effort is normal.     Breath sounds: Normal breath sounds. No wheezing, rhonchi or rales.  Musculoskeletal:        General: Normal range of motion.     Right lower leg: No edema.     Left lower leg: No edema.  Skin:  General: Skin is warm and dry.     Findings: No rash.  Neurological:     General: No focal deficit present.     Mental Status: He is alert and oriented to person, place, and time.     Cranial Nerves: No cranial nerve deficit.  Psychiatric:        Mood and Affect: Mood normal.        Behavior: Behavior normal.        Thought Content: Thought content normal.        Judgment: Judgment normal.      Assessment and Plan   1. Hemorrhoids, unspecified hemorrhoid type  2. Frequent urination - POCT Urinalysis Dip Manual - Urine Culture    HM- Pt declining refer for colonoscopy at this time.  appt with urology 05/28/20.  Seeing for frequent urination.  Pt cont with flomax.  Pt requesting refill on cipro.  Reviewed with pt that it isn't a  prophylactic dose for UTI's at 500mg  cipro, was given 42 tablets with refills by last pcp. So advising pt to f/u with urology for further antibiotics if needed.  UA today was normal, no sign of infection.  Will send for culture.  Hemorrhoid/blood in stool- resolved Pt stating the blood in stool resolved.  Just seen a few drips after having bm and that was 3 days ago. Pt stating he will watch for increase in blood in stool or black stools and return if worsening.  F/u prn.

## 2020-05-02 ENCOUNTER — Encounter: Payer: Self-pay | Admitting: Family Medicine

## 2020-05-02 DIAGNOSIS — Z87438 Personal history of other diseases of male genital organs: Secondary | ICD-10-CM | POA: Insufficient documentation

## 2020-05-02 LAB — URINE CULTURE: Organism ID, Bacteria: NO GROWTH

## 2020-05-02 LAB — SPECIMEN STATUS REPORT

## 2020-05-28 DIAGNOSIS — N401 Enlarged prostate with lower urinary tract symptoms: Secondary | ICD-10-CM | POA: Diagnosis not present

## 2020-05-28 DIAGNOSIS — R358 Other polyuria: Secondary | ICD-10-CM | POA: Diagnosis not present

## 2020-05-28 DIAGNOSIS — R350 Frequency of micturition: Secondary | ICD-10-CM | POA: Diagnosis not present

## 2020-05-28 DIAGNOSIS — R3912 Poor urinary stream: Secondary | ICD-10-CM | POA: Diagnosis not present

## 2020-06-14 ENCOUNTER — Telehealth: Payer: Self-pay | Admitting: *Deleted

## 2020-06-14 MED ORDER — SILDENAFIL CITRATE 100 MG PO TABS: ORAL_TABLET | ORAL | 2 refills | Status: DC

## 2020-06-14 NOTE — Telephone Encounter (Signed)
Fax from [facility] requesting refill on sildenafil 100mg  tablet. Take one half to one tablet bymouth every day as needed for ED. Last med check up 02/10/20

## 2020-06-14 NOTE — Telephone Encounter (Signed)
Refill x3 please

## 2020-06-14 NOTE — Telephone Encounter (Signed)
Prescription sent electronically to pharmacy. 

## 2020-06-16 ENCOUNTER — Ambulatory Visit: Payer: Self-pay | Admitting: Urology

## 2020-07-02 DIAGNOSIS — N401 Enlarged prostate with lower urinary tract symptoms: Secondary | ICD-10-CM | POA: Diagnosis not present

## 2020-07-02 DIAGNOSIS — N5201 Erectile dysfunction due to arterial insufficiency: Secondary | ICD-10-CM | POA: Diagnosis not present

## 2020-07-02 DIAGNOSIS — R3912 Poor urinary stream: Secondary | ICD-10-CM | POA: Diagnosis not present

## 2020-08-13 ENCOUNTER — Encounter: Payer: Self-pay | Admitting: Urology

## 2020-08-13 ENCOUNTER — Other Ambulatory Visit: Payer: Self-pay

## 2020-08-13 ENCOUNTER — Ambulatory Visit (INDEPENDENT_AMBULATORY_CARE_PROVIDER_SITE_OTHER): Payer: Medicare Other | Admitting: Urology

## 2020-08-13 VITALS — BP 192/81 | HR 71 | Temp 98.0°F | Ht 70.0 in | Wt 262.0 lb

## 2020-08-13 DIAGNOSIS — R39198 Other difficulties with micturition: Secondary | ICD-10-CM

## 2020-08-13 DIAGNOSIS — N138 Other obstructive and reflux uropathy: Secondary | ICD-10-CM

## 2020-08-13 DIAGNOSIS — N401 Enlarged prostate with lower urinary tract symptoms: Secondary | ICD-10-CM

## 2020-08-13 LAB — URINALYSIS, ROUTINE W REFLEX MICROSCOPIC
Bilirubin, UA: NEGATIVE
Glucose, UA: NEGATIVE
Ketones, UA: NEGATIVE
Nitrite, UA: POSITIVE — AB
Specific Gravity, UA: 1.02 (ref 1.005–1.030)
Urobilinogen, Ur: 0.2 mg/dL (ref 0.2–1.0)
pH, UA: 6 (ref 5.0–7.5)

## 2020-08-13 LAB — BLADDER SCAN AMB NON-IMAGING: Scan Result: 90

## 2020-08-13 LAB — MICROSCOPIC EXAMINATION
Bacteria, UA: NONE SEEN
Epithelial Cells (non renal): NONE SEEN /hpf (ref 0–10)
RBC, Urine: >30 /hpf — AB (ref 0–2)
Renal Epithel, UA: NONE SEEN /hpf

## 2020-08-13 MED ORDER — ALFUZOSIN HCL ER 10 MG PO TB24: 10.0000 mg | ORAL_TABLET | Freq: Every day | ORAL | 11 refills | Status: DC

## 2020-08-13 MED ORDER — SULFAMETHOXAZOLE-TRIMETHOPRIM 800-160 MG PO TABS: 1.0000 | ORAL_TABLET | Freq: Two times a day (BID) | ORAL | 0 refills | Status: DC

## 2020-08-13 NOTE — Patient Instructions (Signed)

## 2020-08-13 NOTE — Progress Notes (Signed)
Urological Symptom Review  Patient is experiencing the following symptoms: Frequent urination Burning/pain with urination Get up at night to urinate Stream starts and stops Trouble starting stream Weak stream Erection problems (male only) Penile pain (male only)    Review of Systems  Gastrointestinal (upper)  : Negative for upper GI symptoms  Gastrointestinal (lower) : Constipation  Constitutional : Negative for symptoms  Skin: Negative for skin symptoms  Eyes: Negative for eye symptoms  Ear/Nose/Throat : Negative for Ear/Nose/Throat symptoms  Hematologic/Lymphatic: Negative for Hematologic/Lymphatic symptoms  Cardiovascular : Negative for cardiovascular symptoms  Respiratory : Negative for respiratory symptoms  Endocrine: Negative for endocrine symptoms  Musculoskeletal: Back pain Joint pain  Neurological: Negative for neurological symptoms  Psychologic: Negative for psychiatric symptoms

## 2020-08-13 NOTE — Progress Notes (Signed)
08/13/2020 11:08 AM   Tony Powell 01-12-1951 [id number]  Referring provider: Erven Colla, DO [place],  [place] [address]  Difficulty urinating  HPI: Tony Powell is a 69yo here for evaluation of BPH with weak urinary stream. Starting over 1 year ago he developed a weak urinary stream, nocturia 7-9x, urinary frequency every 15-30 minutes, urinary urgency with occasional urge incontinence. He is straining to urinate. He has urinary hesitancy and starting/stopping of his stream. He has dysuria and penile pain at the glans.  He was treated with keflex with Dr. Wolfgang Phoenix. He has previously tried flomax and rapaflo which failed to improved his LUTS. He was then placed on finasteride by Dr. Milford Cage  His records from [place] are as follows: Patient is a 69 year old African American male presents today with intractable urinary frequency and urgency. The patient states symptoms began approximately 6 months ago and have gradually worsened to the point where now he is going every hour or less during the day with small voided volumes. States he has hesitancy and decreased force of stream as well. Denies any dysuria or hematuria. All urologic history is significant for similar episode which apparently required a Foley catheter in the [place] area back in 2012. Current symptoms are moderate in severity with no relieving factors. Denies any history of stones or family history of prostate cancer. Postvoid residual today is 117 cc micro urinalysis today is clear.  -07/02/20-patient with history of BPH and mildly elevated residual as above. Was prescribed silodosin 8 mg daily. He tried the medicine for about 5 days and noted increased frequency of urination but still with reduced force of stream. He stated he cannot afford that medicine long-term anyway but certainly did not seem to improve things while he was on it. The patient again has history of having had a catheter in the past and has a very remote  history of having STD as a teenager.  Cysto performed today and shows marked trilobar prostatic hypertrophy with significant intravesical component. Has very loculated prostate with several lobules intravesically. Retroflexion of the scope shows these to be contiguous at the bladder neck.  Patient also complains of issues with erectile dysfunction. Has difficulty achieving and maintaining erections. He has used sildenafil successfully in the past and would like renewal on prescription today.    PMH: Past Medical History:  Diagnosis Date   Abnormal stress test    Borderline hypertension    Chest discomfort    Atypical   DJD (degenerative joint disease)    Knees, lumbar sacral spine   Hyperlipidemia    Hypertension     Surgical History: Past Surgical History:  Procedure Laterality Date   BACK SURGERY      Home Medications:  Allergies as of 08/13/2020      Reactions   Celebrex [celecoxib]    Eyes drooping and messed up skin on hands      Medication List       Accurate as of August 13, 2020 11:08 AM. If you have any questions, ask your nurse or doctor.        Aleve 220 MG tablet Generic drug: naproxen sodium Take 660 mg by mouth daily as needed (for pain).   finasteride 5 MG tablet Commonly known as: PROSCAR   sildenafil 100 MG tablet Commonly known as: Viagra Take one half to one tablet by mouth every day as needed for ED.   tamsulosin 0.4 MG Caps capsule Commonly known as: FLOMAX Take 1  capsule (0.4 mg total) by mouth at bedtime.   verapamil 240 MG CR tablet Commonly known as: CALAN-SR TAKE ONE (1) TABLET (240 MG TOTAL) BY MOUTH AT BEDTIME.       Allergies:  Allergies  Allergen Reactions   Celebrex [Celecoxib]     Eyes drooping and messed up skin on hands    Family History: Family History  Problem Relation Age of Onset   Heart attack Mother    Breast cancer Mother        Mastectomy   Leukemia Father     Social History:  reports that  he quit smoking about 21 years ago. He has a 20.00 pack-year smoking history. He has never used smokeless tobacco. He reports that he does not drink alcohol. No history on file for drug use.  ROS: All other review of systems were reviewed and are negative except what is noted above in HPI  Physical Exam: BP (!) 192/81    Pulse 71    Temp 98 F (36.7 C)    Ht 5\' 10"  (1.778 m)    Wt 262 lb (118.8 kg)    BMI 37.59 kg/m   Constitutional:  Alert and oriented, No acute distress. HEENT: [place] AT, moist mucus membranes.  Trachea midline, no masses. Cardiovascular: No clubbing, cyanosis, or edema. Respiratory: Normal respiratory effort, no increased work of breathing. GI: Abdomen is soft, nontender, nondistended, no abdominal masses GU: No CVA tenderness. Circumcised phallus. No masses/lesions on penis, testis, scrotum. Prostate 40g smooth no nodules no induration.  Lymph: No cervical or inguinal lymphadenopathy. Skin: No rashes, bruises or suspicious lesions. Neurologic: Grossly intact, no focal deficits, moving all 4 extremities. Psychiatric: Normal mood and affect.  Laboratory Data: Lab Results  Component Value Date   WBC 5.0 12/18/2018   HGB 13.6 12/18/2018   HCT 42.5 12/18/2018   MCV 81 12/18/2018   PLT 239 12/18/2018    Lab Results  Component Value Date   CREATININE 1.07 12/18/2018    No results found for: PSA  No results found for: TESTOSTERONE  No results found for: HGBA1C  Urinalysis    Component Value Date/Time   APPEARANCEUR Cloudy (A) 08/13/2020 1042   GLUCOSEU Negative 08/13/2020 1042   BILIRUBINUR Negative 08/13/2020 1042   PROTEINUR 2+ (A) 08/13/2020 1042   NITRITE Positive (A) 08/13/2020 1042   LEUKOCYTESUR 2+ (A) 08/13/2020 1042    Lab Results  Component Value Date   LABMICR See below: 08/13/2020   WBCUA 11-30 (A) 08/13/2020   LABEPIT None seen 08/13/2020   BACTERIA None seen 08/13/2020    Pertinent Imaging:  No results found for this or any previous  visit.  No results found for this or any previous visit.  No results found for this or any previous visit.  No results found for this or any previous visit.  No results found for this or any previous visit.  No results found for this or any previous visit.  No results found for this or any previous visit.  No results found for this or any previous visit.   Assessment & Plan:    1. Difficulty voiding We will start uroxatral 10mg  qhs -bactrim DS BID for 28 days - Urinalysis, Routine w reflex microscopic - Bladder Scan (Post Void Residual) in office   No follow-ups on file.  [name], MD  [facility] [place]

## 2020-08-13 NOTE — H&P (View-Only) (Signed)
08/13/2020 11:08 AM   Tony Powell 04/15/1951 [id number]  Referring provider: Erven Colla, DO [place],  [place] [address]  Difficulty urinating  HPI: Tony Powell is a 69yo here for evaluation of BPH with weak urinary stream. Starting over 1 year ago he developed a weak urinary stream, nocturia 7-9x, urinary frequency every 15-30 minutes, urinary urgency with occasional urge incontinence. He is straining to urinate. He has urinary hesitancy and starting/stopping of his stream. He has dysuria and penile pain at the glans.  He was treated with keflex with Dr. Wolfgang Phoenix. He has previously tried flomax and rapaflo which failed to improved his LUTS. He was then placed on finasteride by Dr. Milford Cage  His records from [place] are as follows: Patient is a 69 year old African American male presents today with intractable urinary frequency and urgency. The patient states symptoms began approximately 6 months ago and have gradually worsened to the point where now he is going every hour or less during the day with small voided volumes. States he has hesitancy and decreased force of stream as well. Denies any dysuria or hematuria. All urologic history is significant for similar episode which apparently required a Foley catheter in the [place] area back in 2012. Current symptoms are moderate in severity with no relieving factors. Denies any history of stones or family history of prostate cancer. Postvoid residual today is 117 cc micro urinalysis today is clear.  -07/02/20-patient with history of BPH and mildly elevated residual as above. Was prescribed silodosin 8 mg daily. He tried the medicine for about 5 days and noted increased frequency of urination but still with reduced force of stream. He stated he cannot afford that medicine long-term anyway but certainly did not seem to improve things while he was on it. The patient again has history of having had a catheter in the past and has a very remote  history of having STD as a teenager.  Cysto performed today and shows marked trilobar prostatic hypertrophy with significant intravesical component. Has very loculated prostate with several lobules intravesically. Retroflexion of the scope shows these to be contiguous at the bladder neck.  Patient also complains of issues with erectile dysfunction. Has difficulty achieving and maintaining erections. He has used sildenafil successfully in the past and would like renewal on prescription today.    PMH: Past Medical History:  Diagnosis Date  . Abnormal stress test   . Borderline hypertension   . Chest discomfort    Atypical  . DJD (degenerative joint disease)    Knees, lumbar sacral spine  . Hyperlipidemia   . Hypertension     Surgical History: Past Surgical History:  Procedure Laterality Date  . BACK SURGERY      Home Medications:  Allergies as of 08/13/2020      Reactions   Celebrex [celecoxib]    Eyes drooping and messed up skin on hands      Medication List       Accurate as of August 13, 2020 11:08 AM. If you have any questions, ask your nurse or doctor.        Aleve 220 MG tablet Generic drug: naproxen sodium Take 660 mg by mouth daily as needed (for pain).   finasteride 5 MG tablet Commonly known as: PROSCAR   sildenafil 100 MG tablet Commonly known as: Viagra Take one half to one tablet by mouth every day as needed for ED.   tamsulosin 0.4 MG Caps capsule Commonly known as: FLOMAX Take 1  capsule (0.4 mg total) by mouth at bedtime.   verapamil 240 MG CR tablet Commonly known as: CALAN-SR TAKE ONE (1) TABLET (240 MG TOTAL) BY MOUTH AT BEDTIME.       Allergies:  Allergies  Allergen Reactions  . Celebrex [Celecoxib]     Eyes drooping and messed up skin on hands    Family History: Family History  Problem Relation Age of Onset  . Heart attack Mother   . Breast cancer Mother        Mastectomy  . Leukemia Father     Social History:  reports that  he quit smoking about 21 years ago. He has a 20.00 pack-year smoking history. He has never used smokeless tobacco. He reports that he does not drink alcohol. No history on file for drug use.  ROS: All other review of systems were reviewed and are negative except what is noted above in HPI  Physical Exam: BP (!) 192/81   Pulse 71   Temp 98 F (36.7 C)   Ht 5\' 10"  (1.778 m)   Wt 262 lb (118.8 kg)   BMI 37.59 kg/m   Constitutional:  Alert and oriented, No acute distress. HEENT: [place] AT, moist mucus membranes.  Trachea midline, no masses. Cardiovascular: No clubbing, cyanosis, or edema. Respiratory: Normal respiratory effort, no increased work of breathing. GI: Abdomen is soft, nontender, nondistended, no abdominal masses GU: No CVA tenderness. Circumcised phallus. No masses/lesions on penis, testis, scrotum. Prostate 40g smooth no nodules no induration.  Lymph: No cervical or inguinal lymphadenopathy. Skin: No rashes, bruises or suspicious lesions. Neurologic: Grossly intact, no focal deficits, moving all 4 extremities. Psychiatric: Normal mood and affect.  Laboratory Data: Lab Results  Component Value Date   WBC 5.0 12/18/2018   HGB 13.6 12/18/2018   HCT 42.5 12/18/2018   MCV 81 12/18/2018   PLT 239 12/18/2018    Lab Results  Component Value Date   CREATININE 1.07 12/18/2018    No results found for: PSA  No results found for: TESTOSTERONE  No results found for: HGBA1C  Urinalysis    Component Value Date/Time   APPEARANCEUR Cloudy (A) 08/13/2020 1042   GLUCOSEU Negative 08/13/2020 1042   BILIRUBINUR Negative 08/13/2020 1042   PROTEINUR 2+ (A) 08/13/2020 1042   NITRITE Positive (A) 08/13/2020 1042   LEUKOCYTESUR 2+ (A) 08/13/2020 1042    Lab Results  Component Value Date   LABMICR See below: 08/13/2020   WBCUA 11-30 (A) 08/13/2020   LABEPIT None seen 08/13/2020   BACTERIA None seen 08/13/2020    Pertinent Imaging:  No results found for this or any previous  visit.  No results found for this or any previous visit.  No results found for this or any previous visit.  No results found for this or any previous visit.  No results found for this or any previous visit.  No results found for this or any previous visit.  No results found for this or any previous visit.  No results found for this or any previous visit.   Assessment & Plan:    1. Difficulty voiding We will start uroxatral 10mg  qhs -bactrim DS BID for 28 days - Urinalysis, Routine w reflex microscopic - Bladder Scan (Post Void Residual) in office   No follow-ups on file.  [name], MD  [facility] [place]

## 2020-08-17 ENCOUNTER — Telehealth: Payer: Self-pay

## 2020-08-17 ENCOUNTER — Other Ambulatory Visit: Payer: Self-pay

## 2020-08-17 LAB — URINE CULTURE

## 2020-08-17 NOTE — Telephone Encounter (Signed)
Pt was given Bactrim at initial visit and urine cx came back needing that antibiotic but called pt and he was doing better he said so held off on new rx. Dr Alyson Ingles had sent message to send in Bactrim. Told pt to call back if needed.

## 2020-08-18 ENCOUNTER — Telehealth: Payer: Self-pay

## 2020-08-18 NOTE — Telephone Encounter (Signed)
Pt reports hematuria since starting his alfuzosin and "changes in my hands" pt currently on bactrim for a recent positive urine culture. Pt scheduled for office visit to see Dr. Alyson Ingles tomorrow.

## 2020-08-19 ENCOUNTER — Ambulatory Visit (INDEPENDENT_AMBULATORY_CARE_PROVIDER_SITE_OTHER): Payer: Medicare Other | Admitting: Urology

## 2020-08-19 ENCOUNTER — Other Ambulatory Visit: Payer: Self-pay

## 2020-08-19 ENCOUNTER — Encounter: Payer: Self-pay | Admitting: Urology

## 2020-08-19 VITALS — BP 135/68 | HR 80 | Temp 98.6°F | Ht 70.0 in | Wt 262.0 lb

## 2020-08-19 DIAGNOSIS — R310 Gross hematuria: Secondary | ICD-10-CM | POA: Insufficient documentation

## 2020-08-19 MED ORDER — CEFTRIAXONE SODIUM 500 MG IJ SOLR
1000.0000 mg | Freq: Once | INTRAMUSCULAR | Status: AC
  Administered 2020-08-19: 1000 mg via INTRAMUSCULAR

## 2020-08-19 MED ORDER — DOXYCYCLINE HYCLATE 100 MG PO CAPS: 100.0000 mg | ORAL_CAPSULE | Freq: Two times a day (BID) | ORAL | 0 refills | Status: DC

## 2020-08-19 MED ORDER — LIDOCAINE HCL 1 % IJ SOLN
2.1000 mL | Freq: Once | INTRAMUSCULAR | Status: AC
  Administered 2020-08-19: 2.1 mL

## 2020-08-19 NOTE — Progress Notes (Signed)

## 2020-08-19 NOTE — Progress Notes (Signed)
Pt given Rocephin IM and tolerated without any difficulty. Pt having bladders spasms and voiding blood. Dr. Alyson Ingles aware and patient will return in the am for a recheck on his symptoms.

## 2020-08-19 NOTE — Progress Notes (Signed)
08/19/2020 9:24 AM   Tony Powell [birth year] [id number]  Referring provider: Erven Colla, DO [address] [place],  [place] [address]  Gross hematuria  HPI: [name] is a 69yo here for followup for BPH. 2 days ago he developed gross hematuria with associated dysuria, urinary frequency, urinary urgency and pelvic pain. No fevers. No hx of UTI. No history of prostate infections. No nausea/vomiting   PMH: Past Medical History:  Diagnosis Date  . Abnormal stress test   . Borderline hypertension   . Chest discomfort    Atypical  . DJD (degenerative joint disease)    Knees, lumbar sacral spine  . Hyperlipidemia   . Hypertension     Surgical History: Past Surgical History:  Procedure Laterality Date  . BACK SURGERY      Home Medications:  Allergies as of 08/19/2020      Reactions   Celebrex [celecoxib]    Eyes drooping and messed up skin on hands      Medication List       Accurate as of August 19, 2020  9:24 AM. If you have any questions, ask your nurse or doctor.        Aleve 220 MG tablet Generic drug: naproxen sodium Take 660 mg by mouth daily as needed (for pain).   alfuzosin 10 MG 24 hr tablet Commonly known as: UROXATRAL Take 1 tablet (10 mg total) by mouth daily with breakfast.   finasteride 5 MG tablet Commonly known as: PROSCAR   sildenafil 100 MG tablet Commonly known as: Viagra Take one half to one tablet by mouth every day as needed for ED.   sulfamethoxazole-trimethoprim 800-160 MG tablet Commonly known as: BACTRIM DS Take 1 tablet by mouth every 12 (twelve) hours.   tamsulosin 0.4 MG Caps capsule Commonly known as: FLOMAX Take 1 capsule (0.4 mg total) by mouth at bedtime.   verapamil 240 MG CR tablet Commonly known as: CALAN-SR TAKE ONE (1) TABLET (240 MG TOTAL) BY MOUTH AT BEDTIME.       Allergies:  Allergies  Allergen Reactions  . Celebrex [Celecoxib]     Eyes drooping and messed up skin on hands    Family  History: Family History  Problem Relation Age of Onset  . Heart attack Mother   . Breast cancer Mother        Mastectomy  . Leukemia Father     Social History:  reports that he quit smoking about 21 years ago. He has a 20.00 pack-year smoking history. He has never used smokeless tobacco. He reports that he does not drink alcohol. No history on file for drug use.  ROS: All other review of systems were reviewed and are negative except what is noted above in HPI  Physical Exam: BP 135/68   Pulse 80   Temp 98.6 F (37 C)   Ht 5\' 10"  (1.778 m)   Wt 262 lb (118.8 kg)   BMI 37.59 kg/m   Constitutional:  Alert and oriented, No acute distress. HEENT: [place] AT, moist mucus membranes.  Trachea midline, no masses. Cardiovascular: No clubbing, cyanosis, or edema. Respiratory: Normal respiratory effort, no increased work of breathing. GI: Abdomen is soft, nontender, nondistended, no abdominal masses GU: No CVA tenderness.  Lymph: No cervical or inguinal lymphadenopathy. Skin: No rashes, bruises or suspicious lesions. Neurologic: Grossly intact, no focal deficits, moving all 4 extremities. Psychiatric: Normal mood and affect.  Laboratory Data: Lab Results  Component Value Date   WBC 5.0 12/18/2018  HGB 13.6 12/18/2018   HCT 42.5 12/18/2018   MCV 81 12/18/2018   PLT 239 12/18/2018    Lab Results  Component Value Date   CREATININE 1.07 12/18/2018    No results found for: PSA  No results found for: TESTOSTERONE  No results found for: HGBA1C  Urinalysis    Component Value Date/Time   APPEARANCEUR Cloudy (A) 08/13/2020 1042   GLUCOSEU Negative 08/13/2020 1042   BILIRUBINUR Negative 08/13/2020 1042   PROTEINUR 2+ (A) 08/13/2020 1042   NITRITE Positive (A) 08/13/2020 1042   LEUKOCYTESUR 2+ (A) 08/13/2020 1042    Lab Results  Component Value Date   LABMICR See below: 08/13/2020   WBCUA 11-30 (A) 08/13/2020   LABEPIT None seen 08/13/2020   BACTERIA None seen 08/13/2020     Pertinent Imaging:  No results found for this or any previous visit.  No results found for this or any previous visit.  No results found for this or any previous visit.  No results found for this or any previous visit.  No results found for this or any previous visit.  No results found for this or any previous visit.  No results found for this or any previous visit.  No results found for this or any previous visit.   Assessment & Plan:    1. Gross hematuria -Urine for culture, will call with results -Rocephin 1g -Doxycyline 100mg  BID for 7 days   No follow-ups on file.  [name], MD  [facility] [place]

## 2020-08-20 ENCOUNTER — Ambulatory Visit (INDEPENDENT_AMBULATORY_CARE_PROVIDER_SITE_OTHER): Payer: Medicare Other | Admitting: Urology

## 2020-08-20 VITALS — BP 177/82 | HR 86 | Temp 98.7°F

## 2020-08-20 DIAGNOSIS — N138 Other obstructive and reflux uropathy: Secondary | ICD-10-CM | POA: Diagnosis not present

## 2020-08-20 DIAGNOSIS — R39198 Other difficulties with micturition: Secondary | ICD-10-CM

## 2020-08-20 DIAGNOSIS — N401 Enlarged prostate with lower urinary tract symptoms: Secondary | ICD-10-CM

## 2020-08-20 LAB — BLADDER SCAN AMB NON-IMAGING: Scan Result: 286

## 2020-08-20 NOTE — Progress Notes (Signed)
Simple Catheter Placement  Due to urinary retention patient is present today for a foley cath placement.  Patient was cleaned and prepped in a sterile fashion with betadine. A 22 FR foley catheter was inserted, urine return was noted  356ml, urine was red in color.  The balloon was filled with 10cc of sterile water.  A leg bag was attached for drainage. Patient was also given a night bag to take home and was given instruction on how to change from one bag to another.  Patient was given instruction on proper catheter care.  Patient tolerated well, no complications were noted   Performed by: A Jaxsen Bernhart RN  Additional notes/ Follow up: patient will return Monday for voiding trial per Dr. Alyson Ingles.

## 2020-08-20 NOTE — Progress Notes (Signed)
08/20/2020 3:51 PM   HAROLD MONCUS 1951-02-14 [id number]  Referring provider: Erven Colla, DO [place],  [place] [address]  Gross hematuria  HPI: [name] is a 69yo here for followup for gross hematuria. He had a foley placed yesterday and he presents today because the foley stopped graining. He is having severe abdominal pain. The foley was irrigated and 30cc of clot was removed. The catheter then started draining again.    PMH: Past Medical History:  Diagnosis Date   Abnormal stress test    Borderline hypertension    Chest discomfort    Atypical   DJD (degenerative joint disease)    Knees, lumbar sacral spine   Hyperlipidemia    Hypertension     Surgical History: Past Surgical History:  Procedure Laterality Date   BACK SURGERY      Home Medications:  Allergies as of 08/20/2020      Reactions   Celebrex [celecoxib]    Eyes drooping and messed up skin on hands      Medication List       Accurate as of August 20, 2020  3:51 PM. If you have any questions, ask your nurse or doctor.        Aleve 220 MG tablet Generic drug: naproxen sodium Take 660 mg by mouth daily as needed (for pain).   alfuzosin 10 MG 24 hr tablet Commonly known as: UROXATRAL Take 1 tablet (10 mg total) by mouth daily with breakfast.   doxycycline 100 MG capsule Commonly known as: VIBRAMYCIN Take 1 capsule (100 mg total) by mouth every 12 (twelve) hours.   finasteride 5 MG tablet Commonly known as: PROSCAR   sildenafil 100 MG tablet Commonly known as: Viagra Take one half to one tablet by mouth every day as needed for ED.   sulfamethoxazole-trimethoprim 800-160 MG tablet Commonly known as: BACTRIM DS Take 1 tablet by mouth every 12 (twelve) hours.   tamsulosin 0.4 MG Caps capsule Commonly known as: FLOMAX Take 1 capsule (0.4 mg total) by mouth at bedtime.   verapamil 240 MG CR tablet Commonly known as: CALAN-SR TAKE ONE (1) TABLET (240 MG TOTAL) BY  MOUTH AT BEDTIME.       Allergies:  Allergies  Allergen Reactions   Celebrex [Celecoxib]     Eyes drooping and messed up skin on hands    Family History: Family History  Problem Relation Age of Onset   Heart attack Mother    Breast cancer Mother        Mastectomy   Leukemia Father     Social History:  reports that he quit smoking about 21 years ago. He has a 20.00 pack-year smoking history. He has never used smokeless tobacco. He reports that he does not drink alcohol. No history on file for drug use.  ROS: All other review of systems were reviewed and are negative except what is noted above in HPI  Physical Exam: BP (!) 177/82    Pulse 86    Temp 98.7 F (37.1 C)   Constitutional:  Alert and oriented, No acute distress. HEENT: [place] AT, moist mucus membranes.  Trachea midline, no masses. Cardiovascular: No clubbing, cyanosis, or edema. Respiratory: Normal respiratory effort, no increased work of breathing. GI: Abdomen is soft, nontender, nondistended, no abdominal masses GU: No CVA tenderness.  Lymph: No cervical or inguinal lymphadenopathy. Skin: No rashes, bruises or suspicious lesions. Neurologic: Grossly intact, no focal deficits, moving all 4 extremities. Psychiatric: Normal mood and  affect.  Laboratory Data: Lab Results  Component Value Date   WBC 5.0 12/18/2018   HGB 13.6 12/18/2018   HCT 42.5 12/18/2018   MCV 81 12/18/2018   PLT 239 12/18/2018    Lab Results  Component Value Date   CREATININE 1.07 12/18/2018    No results found for: PSA  No results found for: TESTOSTERONE  No results found for: HGBA1C  Urinalysis    Component Value Date/Time   APPEARANCEUR Cloudy (A) 08/13/2020 1042   GLUCOSEU Negative 08/13/2020 1042   BILIRUBINUR Negative 08/13/2020 1042   PROTEINUR 2+ (A) 08/13/2020 1042   NITRITE Positive (A) 08/13/2020 1042   LEUKOCYTESUR 2+ (A) 08/13/2020 1042    Lab Results  Component Value Date   LABMICR See below: 08/13/2020    WBCUA 11-30 (A) 08/13/2020   LABEPIT None seen 08/13/2020   BACTERIA None seen 08/13/2020    Pertinent Imaging:  No results found for this or any previous visit.  No results found for this or any previous visit.  No results found for this or any previous visit.  No results found for this or any previous visit.  No results found for this or any previous visit.  No results found for this or any previous visit.  No results found for this or any previous visit.  No results found for this or any previous visit.   Assessment & Plan:    1. Benign prostatic hyperplasia with urinary obstruction -continue foley  2. Difficulty voiding -Foley catheter irrigated to clear. Patient instructed to drink 100oz of water. He was given irrigation tray and 1L water to flush foley. RTC Monday morning - Bladder Scan (Post Void Residual) in office   No follow-ups on file.  [name], MD  [facility] [place]

## 2020-08-20 NOTE — Progress Notes (Signed)
Bladder Irrigation  Due to foley not draining patient is present today for a bladder irrigation. Patient was cleaned and prepped in a sterile fashion. 700 ml of saline/sterile water was instilled and irrigated into the bladder with a 75ml [place] syringe through the catheter in place.  After irrigation urine flow was noted no complications were noted catheter is now draining fine.  Catheter was reattached to the leg bag for drainage. Patient tolerated well.   Preformed by: Park Pope RN Additional notes/ Follow up: pt will irrigate catheter at home if catheter should stop flowing again per Dr. Alyson Ingles. Pt voiced understanding. Pt will come back to office on Monday.

## 2020-08-21 ENCOUNTER — Emergency Department (HOSPITAL_COMMUNITY)
Admission: EM | Admit: 2020-08-21 | Discharge: 2020-08-21 | Disposition: A | Discharge status: Home / Self Care | Payer: Medicare Other | Attending: Emergency Medicine | Admitting: Emergency Medicine

## 2020-08-21 ENCOUNTER — Other Ambulatory Visit: Payer: Self-pay

## 2020-08-21 ENCOUNTER — Encounter (HOSPITAL_COMMUNITY): Payer: Self-pay

## 2020-08-21 DIAGNOSIS — Z87891 Personal history of nicotine dependence: Secondary | ICD-10-CM | POA: Insufficient documentation

## 2020-08-21 DIAGNOSIS — I10 Essential (primary) hypertension: Secondary | ICD-10-CM | POA: Diagnosis not present

## 2020-08-21 DIAGNOSIS — Z79899 Other long term (current) drug therapy: Secondary | ICD-10-CM | POA: Insufficient documentation

## 2020-08-21 DIAGNOSIS — N401 Enlarged prostate with lower urinary tract symptoms: Secondary | ICD-10-CM | POA: Insufficient documentation

## 2020-08-21 DIAGNOSIS — R310 Gross hematuria: Secondary | ICD-10-CM | POA: Diagnosis not present

## 2020-08-21 DIAGNOSIS — Z87438 Personal history of other diseases of male genital organs: Secondary | ICD-10-CM | POA: Insufficient documentation

## 2020-08-21 DIAGNOSIS — R339 Retention of urine, unspecified: Secondary | ICD-10-CM | POA: Diagnosis not present

## 2020-08-21 DIAGNOSIS — T83091A Other mechanical complication of indwelling urethral catheter, initial encounter: Secondary | ICD-10-CM | POA: Diagnosis not present

## 2020-08-21 DIAGNOSIS — R319 Hematuria, unspecified: Secondary | ICD-10-CM | POA: Diagnosis present

## 2020-08-21 LAB — CBC
HCT: 33.8 % — ABNORMAL LOW (ref 39.0–52.0)
Hemoglobin: 11 g/dL — ABNORMAL LOW (ref 13.0–17.0)
MCH: 27.7 pg (ref 26.0–34.0)
MCHC: 32.5 g/dL (ref 30.0–36.0)
MCV: 85.1 fL (ref 80.0–100.0)
Platelets: 271 10*3/uL (ref 150–400)
RBC: 3.97 MIL/uL — ABNORMAL LOW (ref 4.22–5.81)
RDW: 14.7 % (ref 11.5–15.5)
WBC: 8.9 10*3/uL (ref 4.0–10.5)
nRBC: 0 % (ref 0.0–0.2)

## 2020-08-21 LAB — BASIC METABOLIC PANEL
Anion gap: 11 (ref 5–15)
BUN: 11 mg/dL (ref 8–23)
CO2: 23 mmol/L (ref 22–32)
Calcium: 9.7 mg/dL (ref 8.9–10.3)
Chloride: 104 mmol/L (ref 98–111)
Creatinine, Ser: 1.24 mg/dL (ref 0.61–1.24)
GFR, Estimated: 59 mL/min — ABNORMAL LOW (ref >60)
Glucose, Bld: 111 mg/dL — ABNORMAL HIGH (ref 70–99)
Potassium: 5.1 mmol/L (ref 3.5–5.1)
Sodium: 138 mmol/L (ref 135–145)

## 2020-08-21 LAB — URINE CULTURE: Organism ID, Bacteria: NO GROWTH

## 2020-08-21 NOTE — Discharge Instructions (Addendum)
Irrigate your catheter with the syringe given if you develop any further blockage - if unable to unblock, you will need to have an ultrasound and probable admission at [facility] as we discussed with Dr Abner Greenspan.  Your hemoglobin is lower than normal today at 11.0 (your last one to compare was 13.6).  However, as long as you are not having any weakness, this is ok for now.

## 2020-08-21 NOTE — ED Triage Notes (Signed)
Pt reports unable to void since approx 0630 this morning.  Pt has foley catheter.

## 2020-08-21 NOTE — ED Provider Notes (Signed)
[facility] EMERGENCY DEPARTMENT Provider Note   CSN: [id number] Arrival date & time: 08/21/20  1027     History Chief Complaint  Patient presents with  . Urinary Retention    Tony Powell is a 69 y.o. male with a history of hypertension, hyperlipidemia, BPH and history of prostatitis under the care of Dr. Alyson Ingles presenting for evaluation of a blocked Foley catheter.  He has seen Dr. Alyson Ingles this week secondary to hematuria and passage of multiple clots causing problems with urinary retention.  He was last seen yesterday morning and a Foley catheter was placed, had to return to the urology office later that day and had a flushed during which time multiple clots were removed.  He was sent home with a syringe to flush his catheter if symptoms developed again.  This morning he woke and was unable to urinate, he attempted to flush the catheter but had no success.  He denies fevers or chills, nausea or vomiting, denies any other complaints but did have significant suprapubic pressure, although as soon as he arrived here he was able to pass a large amount of bloody urine.  He is currently on doxycycline for presumptive acute process vs hemorrhagic cystitis.       HPI     Past Medical History:  Diagnosis Date  . Abnormal stress test   . Borderline hypertension   . Chest discomfort    Atypical  . DJD (degenerative joint disease)    Knees, lumbar sacral spine  . Hyperlipidemia   . Hypertension     Patient Active Problem List   Diagnosis Date Noted  . Gross hematuria 08/19/2020  . Difficulty voiding 08/13/2020  . 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/13/2020  . History of prostatitis 05/02/2020  . HYPERLIPIDEMIA 05/04/2010  . OVERWEIGHT 05/04/2010  . HYPERTENSION, BORDERLINE 04/28/2010  . DEGENERATIVE JOINT DISEASE 04/28/2010  . CHEST PAIN 04/28/2010  . ABNORMAL STRESS ELECTROCARDIOGRAM 04/28/2010    Past Surgical History:  Procedure Laterality Date  .  BACK SURGERY         Family History  Problem Relation Age of Onset  . Heart attack Mother   . Breast cancer Mother        Mastectomy  . Leukemia Father     Social History   Tobacco Use  . Smoking status: Former Smoker    Packs/day: 1.00    Years: 20.00    Pack years: 20.00    Quit date: 11/13/1998    Years since quitting: 21.7  . Smokeless tobacco: Never Used  Substance Use Topics  . Alcohol use: No  . Drug use: Not on file    Home Medications Prior to Admission medications   Medication Sig Start Date End Date Taking? Authorizing Provider  alfuzosin (UROXATRAL) 10 MG 24 hr tablet Take 1 tablet (10 mg total) by mouth daily with breakfast. 08/13/20   McKenzie, Candee Furbish, MD  doxycycline (VIBRAMYCIN) 100 MG capsule Take 1 capsule (100 mg total) by mouth every 12 (twelve) hours. 08/19/20   McKenzie, Candee Furbish, MD  finasteride (PROSCAR) 5 MG tablet  07/02/20   [provider]  naproxen sodium (ALEVE) 220 MG tablet Take 660 mg by mouth daily as needed (for pain).    [provider]  sildenafil (VIAGRA) 100 MG tablet Take one half to one tablet by mouth every day as needed for ED. 06/14/20   Kathyrn Drown, MD  sulfamethoxazole-trimethoprim (BACTRIM DS) 800-160 MG tablet Take 1 tablet by mouth  every 12 (twelve) hours. 08/13/20   McKenzie, Candee Furbish, MD  tamsulosin (FLOMAX) 0.4 MG CAPS capsule Take 1 capsule (0.4 mg total) by mouth at bedtime. Patient not taking: Reported on 08/13/2020 04/08/20   Mikey Kirschner, MD  verapamil (CALAN-SR) 240 MG CR tablet TAKE ONE (1) TABLET (240 MG TOTAL) BY MOUTH AT BEDTIME. 02/10/20   Mikey Kirschner, MD    Allergies    Celebrex [celecoxib]  Review of Systems   Review of Systems  Constitutional: Negative for fever.  HENT: Negative for congestion and sore throat.   Eyes: Negative.   Respiratory: Negative for chest tightness and shortness of breath.   Cardiovascular: Negative for chest pain.  Gastrointestinal: Negative for  abdominal pain and nausea.  Genitourinary: Positive for difficulty urinating and hematuria.  Musculoskeletal: Negative for arthralgias, joint swelling and neck pain.  Skin: Negative.  Negative for rash and wound.  Neurological: Negative for dizziness, weakness, light-headedness, numbness and headaches.  Psychiatric/Behavioral: Negative.     Physical Exam Updated Vital Signs BP (!) 171/90 (BP Location: Right Arm)   Pulse 99   Temp 98.9 F (37.2 C) (Oral)   Resp 18   Ht 5\' 10"  (1.778 m)   Wt 118 kg   SpO2 98%   BMI 37.33 kg/m   Physical Exam Vitals and nursing note reviewed.  Constitutional:      Appearance: He is well-developed.  HENT:     Head: Normocephalic and atraumatic.  Eyes:     Conjunctiva/sclera: Conjunctivae normal.  Cardiovascular:     Rate and Rhythm: Normal rate and regular rhythm.     Heart sounds: Normal heart sounds.  Pulmonary:     Effort: Pulmonary effort is normal.     Breath sounds: Normal breath sounds. No wheezing.  Abdominal:     General: Bowel sounds are normal.     Palpations: Abdomen is soft.     Tenderness: There is no abdominal tenderness.  Genitourinary:    Comments: GU exam deferred.  Foley tubing present.  Small amount of bloody urine in leg bag.  Musculoskeletal:        General: Normal range of motion.     Cervical back: Normal range of motion.  Skin:    General: Skin is warm and dry.  Neurological:     Mental Status: He is alert.     ED Results / Procedures / Treatments   Labs (all labs ordered are listed, but only abnormal results are displayed) Labs Reviewed  CBC - Abnormal; Notable for the following components:      Result Value   RBC 3.97 (*)    Hemoglobin 11.0 (*)    HCT 33.8 (*)    All other components within normal limits  BASIC METABOLIC PANEL - Abnormal; Notable for the following components:   Glucose, Bld 111 (*)    GFR, Estimated 59 (*)    All other components within normal limits     EKG None  Radiology No results found.  Procedures Procedures (including critical care time)  Medications Ordered in ED Medications - No data to display  ED Course  I have reviewed the triage vital signs and the nursing notes.  Pertinent labs & imaging results that were available during my care of the patient were reviewed by me and considered in my medical decision making (see chart for details).    MDM Rules/Calculators/A&P  Upon arrival patient was able to pass a large amount of bloody urine and had complete relief of his suprapubic pressure.  A bladder scan revealed no residual urine.  The catheter was flushed multiple times to help clear up any residual clots and multiple clots were flushed through the Foley.  Per RN, there was an apparent large clot that she was unable to suction or push through the catheter.  However he was able to continue producing urine through the Foley tubing.  Call placed to Dr. Abner Greenspan of urology to discuss options for this patient.  He did recommend a bladder ultrasound to better determine the quantity of clots remaining in the bladder.  If there is a large number of clots patient could be admitted at [facility] for continued flushing.  Discussed this plan with patient, however he states he currently feels completely symptom-free as long as he continues to be able to empty his bladder he does not want to proceed with this plan.  He does have a follow-up with Dr. Alyson Ingles in 2 days in his office.  Patient was advised that if he has a return of his symptoms he can attempt to flush catheter at home and he was given instructions on how to do this, if this is not effective he should either return here or ideally proceed to [facility] long hospital in the event he would need to be admitted to urology.  Patient agrees and understands with plan.  His wife at the bedside also agreed with this plan.  Labs obtained revealed a mild anemia with a  hemoglobin of 11.0.  This was discussed with patient.  He was asked to let Dr. Alyson Ingles know on Monday as he may want to consider repeating this number, especially if he continues to pass blood through his catheter.  He is asymptomatic, denies weakness, lightheadedness.  Final Clinical Impression(s) / ED Diagnoses Final diagnoses:  Gross hematuria    Rx / DC Orders ED Discharge Orders    None       Landis Martins 08/21/20 1913    Milton Ferguson, MD 08/22/20 [id number]

## 2020-08-21 NOTE — ED Notes (Signed)
Foley irrigated per orders with sterile irrigation water warmed.  Pt tolerated well.  Large clots have been removed but foley is slow to drain.   Consulted with provider.

## 2020-08-23 ENCOUNTER — Ambulatory Visit (INDEPENDENT_AMBULATORY_CARE_PROVIDER_SITE_OTHER): Payer: Medicare Other | Admitting: Urology

## 2020-08-23 ENCOUNTER — Other Ambulatory Visit: Payer: Self-pay

## 2020-08-23 ENCOUNTER — Encounter: Payer: Self-pay | Admitting: Urology

## 2020-08-23 DIAGNOSIS — D494 Neoplasm of unspecified behavior of bladder: Secondary | ICD-10-CM

## 2020-08-23 MED ORDER — CIPROFLOXACIN HCL 500 MG PO TABS
500.0000 mg | ORAL_TABLET | Freq: Once | ORAL | Status: AC
  Administered 2020-08-23: 500 mg via ORAL

## 2020-08-23 NOTE — Patient Instructions (Addendum)
Please go to [facility] on 08/24/2020 at 9:45am for Covid test for upcoming surgery. Pre op will call you to speak with you as well concerning your upcoming surgery.     Transurethral Resection of Bladder Tumor  Transurethral resection of a bladder tumor is the removal (resection) of a cancerous growth (tumor) on the inside wall of the bladder. The bladder is the organ that holds urine. The tumor is removed through the tube that carries urine out of the body (urethra). In a transurethral resection, a thin telescope with a light, a tiny camera, and an electric cutting edge (resectoscope) is passed through the urethra. In men, the opening of the urethra is at the end of the penis. In women, it is just above the opening of the vagina. Tell a health care provider about:  Any allergies you have.  All medicines you are taking, including vitamins, herbs, eye drops, creams, and over-the-counter medicines.  Any problems you or family members have had with anesthetic medicines.  Any blood disorders you have.  Any surgeries you have had.  Any medical conditions you have.  Any recent urinary tract infections you have had.  Whether you are pregnant or may be pregnant. What are the risks? Generally, this is a safe procedure. However, problems may occur, including:  Infection.  Bleeding.  Allergic reactions to medicines.  Damage to nearby structures or organs, such as: ? The urethra. ? The tubes that drain urine from the kidneys into the bladder (ureters).  Pain and burning during urination.  Difficulty urinating due to partial blockage of the urethra.  Inability to urinate (urinary retention). What happens before the procedure? Staying hydrated Follow instructions from your health care provider about hydration, which may include:  Up to 2 hours before the procedure - you may continue to drink clear liquids, such as water, clear fruit juice, black coffee, and plain  tea.  Eating and drinking restrictions Follow instructions from your health care provider about eating and drinking, which may include:  8 hours before the procedure - stop eating heavy meals or foods, such as meat, fried foods, or fatty foods.  6 hours before the procedure - stop eating light meals or foods, such as toast or cereal.  6 hours before the procedure - stop drinking milk or drinks that contain milk.  2 hours before the procedure - stop drinking clear liquids. Medicines Ask your health care provider about:  Changing or stopping your regular medicines. This is especially important if you are taking diabetes medicines or blood thinners.  Taking medicines such as aspirin and ibuprofen. These medicines can thin your blood. Do not take these medicines unless your health care provider tells you to take them.  Taking over-the-counter medicines, vitamins, herbs, and supplements. Tests You may have exams or tests, including:  Physical exam.  Blood tests.  Urine tests.  Electrocardiogram (ECG). This test measures the electrical activity of the heart. General instructions  Plan to have someone take you home from the hospital or clinic.  Ask your health care provider how your surgical site will be marked or identified.  Ask your health care provider what steps will be taken to help prevent infection. These may include: ? Washing skin with a germ-killing soap. ? Taking antibiotic medicine. What happens during the procedure?  An IV will be inserted into one of your veins.  You will be given one or more of the following: ? A medicine to help you relax (sedative). ?  A medicine to make you fall asleep (general anesthetic). ? A medicine that is injected into your spine to numb the area below and slightly above the injection site (spinal anesthetic).  Your legs will be placed in foot rests (stirrups) so that your legs are apart and your knees are bent.  The resectoscope  will be passed through your urethra and into your bladder.  The part of your bladder that is affected by the tumor will be resected using the cutting edge of the resectoscope.  The resectoscope will be removed.  A thin, flexible tube (catheter) will be passed through your urethra and into your bladder. The catheter will drain urine into a bag outside of your body. ? Fluid may be passed through the catheter to keep the catheter open. The procedure may vary among health care providers and hospitals. What happens after the procedure?  Your blood pressure, heart rate, breathing rate, and blood oxygen level will be monitored until you leave the hospital or clinic.  You may continue to receive fluids and medicines through an IV.  You will have some pain. You will be given pain medicine to relieve pain.  You will have a catheter to drain your urine. ? You will have blood in your urine. Your catheter may be kept in until your urine is clear. ? The amount of urine will be monitored. If necessary, your bladder may be rinsed out (irrigated) by passing fluid through your catheter.  You will be encouraged to walk around as soon as possible.  You may have to wear compression stockings. These stockings help to prevent blood clots and reduce swelling in your legs.  Do not drive for 24 hours if you were given a sedative during your procedure. Summary  Transurethral resection of a bladder tumor is the removal (resection) of a cancerous growth (tumor) on the inside wall of the bladder.  To do this procedure, your health care provider uses a thin telescope with a light, a tiny camera, and an electric cutting edge (resectoscope).  Follow your health care provider's instructions. You may need to stop or change certain medicines, and you may be told to stop eating and drinking several hours before the procedure.  Your blood pressure, heart rate, breathing rate, and blood oxygen level will be monitored  until you leave the hospital or clinic.  You may have to wear compression stockings. These stockings help to prevent blood clots and reduce swelling in your legs. This information is not intended to replace advice given to you by your health care provider. Make sure you discuss any questions you have with your health care provider. Document Revised: 05/31/2018 Document Reviewed: 05/31/2018 Elsevier Patient Education  [employer].

## 2020-08-23 NOTE — Progress Notes (Signed)
Cath Change/ Replacement  Patient is present today for a catheter change due to urinary retention.  21ml of water was removed from the balloon, a 22FR foley cath was removed with out difficulty.  Patient was cleaned and prepped in a sterile fashion with betadine. A 22 FR foley cath was replaced into the bladder no complications were noted Urine return was noted 24ml and urine was red in color. The balloon was filled with 14ml of sterile water. A leg bag was attached for drainage.  A night bag was also given to the patient and patient was given instruction on how to change from one bag to another. Patient was given proper instruction on catheter care.    Performed by: A Nemiah Kissner RN  Follow up: scheduled for surgery on 08-25-2020

## 2020-08-23 NOTE — Progress Notes (Signed)
° °  Fill and Pull Catheter Removal  Patient is present today for a catheter removal. 20ml of water was put into pts bladder when he urinated it all right back. Bladder was drained. Put 43ml more of water into pts bladder and it came right back with two small blood clots.At this point procedure was stopped.     Performed by: Jorge Ny

## 2020-08-23 NOTE — Progress Notes (Signed)
   08/23/20  CC: Gross hematuria  HPI: Tony Powell is a 69yo with recurrent gross hematuria he for followup today. He presented to the ER over the weekend for clot retention. Foley is draining pink urine There were no vitals taken for this visit. NED. A&Ox3.   No respiratory distress   Abd soft, NT, ND Normal phallus with bilateral descended testicles  Cystoscopy Procedure Note  Patient identification was confirmed, informed consent was obtained, and patient was prepped using Betadine solution.  Lidocaine jelly was administered per urethral meatus.     Pre-Procedure: - Inspection reveals a normal caliber ureteral meatus.  Procedure: The flexible cystoscope was introduced without difficulty - No urethral strictures/lesions are present. - Enlarged prostate  - Normal bladder neck - Bilateral ureteral orifices identified - 2xm left lateral wall/trigone tumor - No bladder stones - No trabeculation  Retroflexion shows 1cm intravesical prostatic protrusion   Post-Procedure: - Patient tolerated the procedure well  Assessment/ Plan: We discussed the management of the patients bladder tumor including transurethral resection and after discussing the treatment the patient wishes to proceed with surgery. Risks/benefits/alterantives discussed  No follow-ups on file.  [name], MD

## 2020-08-24 ENCOUNTER — Encounter (HOSPITAL_COMMUNITY): Payer: Self-pay

## 2020-08-24 ENCOUNTER — Encounter (HOSPITAL_COMMUNITY)
Admission: RE | Admit: 2020-08-24 | Discharge: 2020-08-24 | Disposition: A | Discharge status: Home / Self Care | Payer: Medicare Other | Source: Ambulatory Visit | Attending: Urology | Admitting: Urology

## 2020-08-24 ENCOUNTER — Other Ambulatory Visit: Payer: Self-pay

## 2020-08-24 ENCOUNTER — Other Ambulatory Visit (HOSPITAL_COMMUNITY)
Admission: RE | Admit: 2020-08-24 | Discharge: 2020-08-24 | Disposition: A | Discharge status: Home / Self Care | Payer: Medicare Other | Source: Ambulatory Visit | Attending: Urology | Admitting: Urology

## 2020-08-24 DIAGNOSIS — Z20822 Contact with and (suspected) exposure to covid-19: Secondary | ICD-10-CM | POA: Diagnosis not present

## 2020-08-24 DIAGNOSIS — Z01812 Encounter for preprocedural laboratory examination: Secondary | ICD-10-CM | POA: Insufficient documentation

## 2020-08-24 DIAGNOSIS — Z01810 Encounter for preprocedural cardiovascular examination: Secondary | ICD-10-CM | POA: Diagnosis not present

## 2020-08-24 LAB — SARS CORONAVIRUS 2 (TAT 6-24 HRS): SARS Coronavirus 2: NEGATIVE

## 2020-08-24 NOTE — Patient Instructions (Signed)
Tony Powell  08/24/2020     @PREFPERIOPPHARMACY @   Your procedure is scheduled on 08/25/2020.  Report to [facility] at 10;30 A.M.  Call this number if you have problems the morning of surgery:  [phone]   Remember:  Do not eat or drink after midnight.     Take these medicines the morning of surgery with A SIP OF WATER ; Verapamil, Uroxatral, Proscar    Do not wear jewelry, make-up or nail polish.  Do not wear lotions, powders, or perfumes, or deodorant.  Do not shave 48 hours prior to surgery.  Men may shave face and neck.  Do not bring valuables to the hospital.  [facility] is not responsible for any belongings or valuables.  Contacts, dentures or bridgework may not be worn into surgery.  Leave your suitcase in the car.  After surgery it may be brought to your room.  For patients admitted to the hospital, discharge time will be determined by your treatment team.  Patients discharged the day of surgery will not be allowed to drive home.   Name and phone number of your driver:   Family Special instructions:  n/a  Please read over the following fact sheets that you were given. Care and Recovery After Surgery   Please use the Hibiclens wash the night before surgery and the morning of surgery.    Chlorhexidine topical antiseptic What is this medicine? CHLORHEXIDINE (klor HEX i deen) is used as a skin wound cleanser and a general skin cleanser. This medicine is also used as a surgical hand scrub and to cleanse the skin before surgery to help prevent infections. This medicine may be used for other purposes; ask your health care provider or pharmacist if you have questions. COMMON BRAND NAME(S): Betasept, Chlorostat, Hibiclens What should I tell my health care provider before I take this medicine? They need to know if you have any of the following conditions:  any skin rashes or problems  an unusual or allergic reaction to chlorhexidine, other medicines, foods,  dyes, or preservatives  pregnant or trying to get pregnant  breast-feeding How should I use this medicine? This medicine is for external use only. Do not take by mouth. Follow the directions on the label or those given to you by your doctor or health care professional. Keep out of eyes, ears and mouth. This medicine should not be used as a preoperative skin preparation of the face or head. Talk to your pediatrician regarding the use of this medicine in children. While this medicine may be used for children for selected conditions, precautions do apply. Overdosage: If you think you have taken too much of this medicine contact a poison control center or emergency room at once. NOTE: This medicine is only for you. Do not share this medicine with others. What if I miss a dose? This does not apply; this medicine is not for regular use. What may interact with this medicine? Interactions are not expected. This list may not describe all possible interactions. Give your health care provider a list of all the medicines, herbs, non-prescription drugs, or dietary supplements you use. Also tell them if you smoke, drink alcohol, or use illegal drugs. Some items may interact with your medicine. What should I watch for while using this medicine? This medicine may cause severe allergic reactions. Notify a health care professional right away if you think you are having an allergic reaction. Do not take this medicine by mouth. Avoid contact with  your ears and eyes. If contact with the eyes occur, rinse the eyes well with plenty of cool tap water. What side effects may I notice from receiving this medicine? Side effects that you should report to your doctor or health care professional as soon as possible:  allergic reactions like skin rash, itching or hives, swelling of the face, lips, or tongue  breathing problems  cough Side effects that usually do not require medical attention (report to your doctor or  health care professional if they continue or are bothersome):  increased sensitivity to the sun  skin irritation This list may not describe all possible side effects. Call your doctor for medical advice about side effects. You may report side effects to FDA at 1-800-FDA-1088. Where should I keep my medicine? Keep out of the reach of children. Store at room temperature between 15 and 30 degrees C (59 and 86 degrees F). Store away from direct light and heat. Do not freeze. Throw away any unused medicine after the expiration date. NOTE: This sheet is a summary. It may not cover all possible information. If you have questions about this medicine, talk to your doctor, pharmacist, or health care provider.  2020 Elsevier/Gold Standard (2015-12-01 20:48:32)  Transurethral Resection of Bladder Tumor  Transurethral resection of a bladder tumor is the removal (resection) of a cancerous growth (tumor) on the inside wall of the bladder. The bladder is the organ that holds urine. The tumor is removed through the tube that carries urine out of the body (urethra). In a transurethral resection, a thin telescope with a light, a tiny camera, and an electric cutting edge (resectoscope) is passed through the urethra. In men, the opening of the urethra is at the end of the penis. In women, it is just above the opening of the vagina. Tell a health care provider about:  Any allergies you have.  All medicines you are taking, including vitamins, herbs, eye drops, creams, and over-the-counter medicines.  Any problems you or family members have had with anesthetic medicines.  Any blood disorders you have.  Any surgeries you have had.  Any medical conditions you have.  Any recent urinary tract infections you have had.  Whether you are pregnant or may be pregnant. What are the risks? Generally, this is a safe procedure. However, problems may occur, including:  Infection.  Bleeding.  Allergic reactions to  medicines.  Damage to nearby structures or organs, such as: ? The urethra. ? The tubes that drain urine from the kidneys into the bladder (ureters).  Pain and burning during urination.  Difficulty urinating due to partial blockage of the urethra.  Inability to urinate (urinary retention). What happens before the procedure? Staying hydrated Follow instructions from your health care provider about hydration, which may include:  Up to 2 hours before the procedure - you may continue to drink clear liquids, such as water, clear fruit juice, black coffee, and plain tea.  Eating and drinking restrictions Follow instructions from your health care provider about eating and drinking, which may include:  8 hours before the procedure - stop eating heavy meals or foods, such as meat, fried foods, or fatty foods.  6 hours before the procedure - stop eating light meals or foods, such as toast or cereal.  6 hours before the procedure - stop drinking milk or drinks that contain milk.  2 hours before the procedure - stop drinking clear liquids. Medicines Ask your health care provider about:  Changing or stopping your regular  medicines. This is especially important if you are taking diabetes medicines or blood thinners.  Taking medicines such as aspirin and ibuprofen. These medicines can thin your blood. Do not take these medicines unless your health care provider tells you to take them.  Taking over-the-counter medicines, vitamins, herbs, and supplements. Tests You may have exams or tests, including:  Physical exam.  Blood tests.  Urine tests.  Electrocardiogram (ECG). This test measures the electrical activity of the heart. General instructions  Plan to have someone take you home from the hospital or clinic.  Ask your health care provider how your surgical site will be marked or identified.  Ask your health care provider what steps will be taken to help prevent infection. These may  include: ? Washing skin with a germ-killing soap. ? Taking antibiotic medicine. What happens during the procedure?  An IV will be inserted into one of your veins.  You will be given one or more of the following: ? A medicine to help you relax (sedative). ? A medicine to make you fall asleep (general anesthetic). ? A medicine that is injected into your spine to numb the area below and slightly above the injection site (spinal anesthetic).  Your legs will be placed in foot rests (stirrups) so that your legs are apart and your knees are bent.  The resectoscope will be passed through your urethra and into your bladder.  The part of your bladder that is affected by the tumor will be resected using the cutting edge of the resectoscope.  The resectoscope will be removed.  A thin, flexible tube (catheter) will be passed through your urethra and into your bladder. The catheter will drain urine into a bag outside of your body. ? Fluid may be passed through the catheter to keep the catheter open. The procedure may vary among health care providers and hospitals. What happens after the procedure?  Your blood pressure, heart rate, breathing rate, and blood oxygen level will be monitored until you leave the hospital or clinic.  You may continue to receive fluids and medicines through an IV.  You will have some pain. You will be given pain medicine to relieve pain.  You will have a catheter to drain your urine. ? You will have blood in your urine. Your catheter may be kept in until your urine is clear. ? The amount of urine will be monitored. If necessary, your bladder may be rinsed out (irrigated) by passing fluid through your catheter.  You will be encouraged to walk around as soon as possible.  You may have to wear compression stockings. These stockings help to prevent blood clots and reduce swelling in your legs.  Do not drive for 24 hours if you were given a sedative during your  procedure. Summary  Transurethral resection of a bladder tumor is the removal (resection) of a cancerous growth (tumor) on the inside wall of the bladder.  To do this procedure, your health care provider uses a thin telescope with a light, a tiny camera, and an electric cutting edge (resectoscope).  Follow your health care provider's instructions. You may need to stop or change certain medicines, and you may be told to stop eating and drinking several hours before the procedure.  Your blood pressure, heart rate, breathing rate, and blood oxygen level will be monitored until you leave the hospital or clinic.  You may have to wear compression stockings. These stockings help to prevent blood clots and reduce swelling in your legs. This  information is not intended to replace advice given to you by your health care provider. Make sure you discuss any questions you have with your health care provider. Document Revised: 05/31/2018 Document Reviewed: 05/31/2018 Elsevier Patient Education  [place].

## 2020-08-25 ENCOUNTER — Encounter (HOSPITAL_COMMUNITY): Payer: Self-pay | Admitting: Urology

## 2020-08-25 ENCOUNTER — Ambulatory Visit (HOSPITAL_COMMUNITY): Payer: Medicare Other | Admitting: Anesthesiology

## 2020-08-25 ENCOUNTER — Other Ambulatory Visit: Payer: Self-pay

## 2020-08-25 ENCOUNTER — Ambulatory Visit (HOSPITAL_COMMUNITY): Payer: Medicare Other

## 2020-08-25 ENCOUNTER — Encounter (HOSPITAL_COMMUNITY)
Admission: RE | Disposition: A | Discharge status: Home / Self Care | Payer: Self-pay | Source: Home / Self Care | Attending: Urology

## 2020-08-25 ENCOUNTER — Observation Stay (HOSPITAL_COMMUNITY)
Admission: RE | Admit: 2020-08-25 | Discharge: 2020-08-26 | Disposition: A | Discharge status: Home / Self Care | Payer: Medicare Other | Attending: Urology | Admitting: Urology

## 2020-08-25 DIAGNOSIS — I10 Essential (primary) hypertension: Secondary | ICD-10-CM | POA: Insufficient documentation

## 2020-08-25 DIAGNOSIS — N400 Enlarged prostate without lower urinary tract symptoms: Secondary | ICD-10-CM | POA: Diagnosis present

## 2020-08-25 DIAGNOSIS — Z87891 Personal history of nicotine dependence: Secondary | ICD-10-CM | POA: Diagnosis not present

## 2020-08-25 DIAGNOSIS — I129 Hypertensive chronic kidney disease with stage 1 through stage 4 chronic kidney disease, or unspecified chronic kidney disease: Secondary | ICD-10-CM | POA: Diagnosis not present

## 2020-08-25 DIAGNOSIS — N3289 Other specified disorders of bladder: Secondary | ICD-10-CM | POA: Diagnosis not present

## 2020-08-25 DIAGNOSIS — R310 Gross hematuria: Secondary | ICD-10-CM | POA: Diagnosis not present

## 2020-08-25 DIAGNOSIS — D494 Neoplasm of unspecified behavior of bladder: Secondary | ICD-10-CM

## 2020-08-25 DIAGNOSIS — C61 Malignant neoplasm of prostate: Secondary | ICD-10-CM | POA: Diagnosis not present

## 2020-08-25 DIAGNOSIS — C679 Malignant neoplasm of bladder, unspecified: Principal | ICD-10-CM | POA: Insufficient documentation

## 2020-08-25 DIAGNOSIS — N189 Chronic kidney disease, unspecified: Secondary | ICD-10-CM | POA: Diagnosis not present

## 2020-08-25 HISTORY — PX: TRANSURETHRAL RESECTION OF BLADDER TUMOR: SHX2575

## 2020-08-25 SURGERY — TURBT (TRANSURETHRAL RESECTION OF BLADDER TUMOR)
Anesthesia: General | Site: Bladder

## 2020-08-25 MED ORDER — DIATRIZOATE MEGLUMINE 30 % UR SOLN
URETHRAL | Status: AC
  Filled 2020-08-25: qty 100

## 2020-08-25 MED ORDER — SUGAMMADEX SODIUM 200 MG/2ML IV SOLN
INTRAVENOUS | Status: DC | PRN
  Administered 2020-08-25: 450 mg via INTRAVENOUS

## 2020-08-25 MED ORDER — LACTATED RINGERS IV SOLN
Freq: Once | INTRAVENOUS | Status: AC
  Administered 2020-08-25: 1000 mL via INTRAVENOUS

## 2020-08-25 MED ORDER — MIDAZOLAM HCL 2 MG/2ML IJ SOLN
INTRAMUSCULAR | Status: AC
  Filled 2020-08-25: qty 2

## 2020-08-25 MED ORDER — FENTANYL CITRATE (PF) 100 MCG/2ML IJ SOLN
INTRAMUSCULAR | Status: AC
  Filled 2020-08-25: qty 2

## 2020-08-25 MED ORDER — MEPERIDINE HCL 50 MG/ML IJ SOLN: 6.2500 mg | INTRAMUSCULAR | Status: DC | PRN

## 2020-08-25 MED ORDER — HYDROMORPHONE HCL 1 MG/ML IJ SOLN
0.5000 mg | INTRAMUSCULAR | Status: DC | PRN
  Administered 2020-08-25 – 2020-08-26 (×3): 1 mg via INTRAVENOUS
  Filled 2020-08-25 (×3): qty 1

## 2020-08-25 MED ORDER — DEXAMETHASONE SODIUM PHOSPHATE 10 MG/ML IJ SOLN
INTRAMUSCULAR | Status: DC | PRN
  Administered 2020-08-25: 5 mg via INTRAVENOUS

## 2020-08-25 MED ORDER — FINASTERIDE 5 MG PO TABS
5.0000 mg | ORAL_TABLET | Freq: Every day | ORAL | Status: DC
  Administered 2020-08-26: 5 mg via ORAL
  Filled 2020-08-25: qty 1

## 2020-08-25 MED ORDER — ONDANSETRON HCL 4 MG/2ML IJ SOLN: 4.0000 mg | Freq: Once | INTRAMUSCULAR | Status: DC | PRN

## 2020-08-25 MED ORDER — FENTANYL CITRATE (PF) 100 MCG/2ML IJ SOLN
INTRAMUSCULAR | Status: AC
Start: 2020-08-25 — End: ?
  Filled 2020-08-25: qty 2

## 2020-08-25 MED ORDER — DEXAMETHASONE SODIUM PHOSPHATE 10 MG/ML IJ SOLN
INTRAMUSCULAR | Status: AC
  Filled 2020-08-25: qty 1

## 2020-08-25 MED ORDER — ESMOLOL HCL 100 MG/10ML IV SOLN
INTRAVENOUS | Status: DC | PRN
  Administered 2020-08-25 (×3): 20 mg via INTRAVENOUS

## 2020-08-25 MED ORDER — ORAL CARE MOUTH RINSE: 15.0000 mL | Freq: Once | OROMUCOSAL | Status: AC

## 2020-08-25 MED ORDER — CHLORHEXIDINE GLUCONATE 0.12 % MT SOLN
15.0000 mL | Freq: Once | OROMUCOSAL | Status: AC
  Administered 2020-08-25: 15 mL via OROMUCOSAL

## 2020-08-25 MED ORDER — ACETAMINOPHEN 325 MG PO TABS: 650.0000 mg | ORAL_TABLET | ORAL | Status: DC | PRN

## 2020-08-25 MED ORDER — VERAPAMIL HCL ER 240 MG PO TBCR
240.0000 mg | EXTENDED_RELEASE_TABLET | Freq: Every day | ORAL | Status: DC
  Administered 2020-08-25: 240 mg via ORAL
  Filled 2020-08-25 (×3): qty 1

## 2020-08-25 MED ORDER — HYDROMORPHONE HCL 1 MG/ML IJ SOLN
0.2500 mg | INTRAMUSCULAR | Status: DC | PRN
  Administered 2020-08-25 (×4): 0.5 mg via INTRAVENOUS
  Filled 2020-08-25 (×4): qty 0.5

## 2020-08-25 MED ORDER — BELLADONNA ALKALOIDS-OPIUM 16.2-60 MG RE SUPP
1.0000 | Freq: Four times a day (QID) | RECTAL | Status: DC | PRN
  Filled 2020-08-25: qty 1

## 2020-08-25 MED ORDER — SODIUM CHLORIDE 0.9 % IR SOLN
3000.0000 mL | Status: DC
  Administered 2020-08-25: 3000 mL

## 2020-08-25 MED ORDER — SODIUM CHLORIDE 0.9 % IV SOLN
2.0000 g | INTRAVENOUS | Status: DC
  Administered 2020-08-25: 2 g via INTRAVENOUS
  Filled 2020-08-25 (×2): qty 20

## 2020-08-25 MED ORDER — STERILE WATER FOR IRRIGATION IR SOLN
Status: DC | PRN
  Administered 2020-08-25: 500 mL

## 2020-08-25 MED ORDER — ONDANSETRON HCL 4 MG/2ML IJ SOLN
INTRAMUSCULAR | Status: DC | PRN
  Administered 2020-08-25: 4 mg via INTRAVENOUS

## 2020-08-25 MED ORDER — ONDANSETRON HCL 4 MG/2ML IJ SOLN
INTRAMUSCULAR | Status: AC
  Filled 2020-08-25: qty 2

## 2020-08-25 MED ORDER — LABETALOL HCL 5 MG/ML IV SOLN
INTRAVENOUS | Status: AC
  Filled 2020-08-25: qty 4

## 2020-08-25 MED ORDER — LACTATED RINGERS IV SOLN: INTRAVENOUS | Status: DC | PRN

## 2020-08-25 MED ORDER — OXYCODONE-ACETAMINOPHEN 5-325 MG PO TABS
ORAL_TABLET | ORAL | Status: AC
  Filled 2020-08-25: qty 2

## 2020-08-25 MED ORDER — CEFAZOLIN SODIUM-DEXTROSE 2-4 GM/100ML-% IV SOLN
2.0000 g | INTRAVENOUS | Status: AC
  Administered 2020-08-25: 2 g via INTRAVENOUS
  Filled 2020-08-25: qty 100

## 2020-08-25 MED ORDER — ROCURONIUM BROMIDE 100 MG/10ML IV SOLN
INTRAVENOUS | Status: DC | PRN
  Administered 2020-08-25: 60 mg via INTRAVENOUS

## 2020-08-25 MED ORDER — FENTANYL CITRATE (PF) 100 MCG/2ML IJ SOLN
INTRAMUSCULAR | Status: DC | PRN
Start: 2020-08-25 — End: 2020-08-25
  Administered 2020-08-25 (×4): 50 ug via INTRAVENOUS

## 2020-08-25 MED ORDER — LABETALOL HCL 5 MG/ML IV SOLN
INTRAVENOUS | Status: DC | PRN
  Administered 2020-08-25 (×2): 5 mg via INTRAVENOUS

## 2020-08-25 MED ORDER — ONDANSETRON HCL 4 MG/2ML IJ SOLN: 4.0000 mg | INTRAMUSCULAR | Status: DC | PRN

## 2020-08-25 MED ORDER — ROCURONIUM BROMIDE 10 MG/ML (PF) SYRINGE
PREFILLED_SYRINGE | INTRAVENOUS | Status: AC
  Filled 2020-08-25: qty 10

## 2020-08-25 MED ORDER — LIDOCAINE 2% (20 MG/ML) 5 ML SYRINGE
INTRAMUSCULAR | Status: AC
  Filled 2020-08-25: qty 5

## 2020-08-25 MED ORDER — HYDRALAZINE HCL 20 MG/ML IJ SOLN
INTRAMUSCULAR | Status: AC
  Filled 2020-08-25: qty 1

## 2020-08-25 MED ORDER — LABETALOL HCL 5 MG/ML IV SOLN
5.0000 mg | Freq: Once | INTRAVENOUS | Status: AC
  Administered 2020-08-25: 5 mg via INTRAVENOUS
  Filled 2020-08-25: qty 4

## 2020-08-25 MED ORDER — OXYCODONE-ACETAMINOPHEN 5-325 MG PO TABS
1.0000 | ORAL_TABLET | ORAL | Status: DC | PRN
  Administered 2020-08-25: 1 via ORAL
  Administered 2020-08-25 – 2020-08-26 (×2): 2 via ORAL
  Filled 2020-08-25: qty 1
  Filled 2020-08-25 (×2): qty 2

## 2020-08-25 MED ORDER — PROPOFOL 10 MG/ML IV BOLUS
INTRAVENOUS | Status: AC
  Filled 2020-08-25: qty 20

## 2020-08-25 MED ORDER — ZOLPIDEM TARTRATE 5 MG PO TABS
5.0000 mg | ORAL_TABLET | Freq: Every evening | ORAL | Status: DC | PRN
  Administered 2020-08-25: 5 mg via ORAL
  Filled 2020-08-25: qty 1

## 2020-08-25 MED ORDER — SODIUM CHLORIDE 0.9 % IV SOLN: INTRAVENOUS | Status: DC

## 2020-08-25 MED ORDER — LIDOCAINE 2% (20 MG/ML) 5 ML SYRINGE
INTRAMUSCULAR | Status: DC | PRN
  Administered 2020-08-25: 60 mg via INTRAVENOUS

## 2020-08-25 MED ORDER — HYDRALAZINE HCL 20 MG/ML IJ SOLN
INTRAMUSCULAR | Status: DC | PRN
  Administered 2020-08-25: 5 mg via INTRAVENOUS

## 2020-08-25 MED ORDER — PROPOFOL 10 MG/ML IV BOLUS
INTRAVENOUS | Status: DC | PRN
  Administered 2020-08-25: 40 mg via INTRAVENOUS
  Administered 2020-08-25: 160 mg via INTRAVENOUS

## 2020-08-25 MED ORDER — DIPHENHYDRAMINE HCL 12.5 MG/5ML PO ELIX: 12.5000 mg | ORAL_SOLUTION | Freq: Four times a day (QID) | ORAL | Status: DC | PRN

## 2020-08-25 MED ORDER — MIDAZOLAM HCL 5 MG/5ML IJ SOLN
INTRAMUSCULAR | Status: DC | PRN
  Administered 2020-08-25: 2 mg via INTRAVENOUS

## 2020-08-25 MED ORDER — ESMOLOL HCL 100 MG/10ML IV SOLN
INTRAVENOUS | Status: AC
  Filled 2020-08-25: qty 10

## 2020-08-25 MED ORDER — SODIUM CHLORIDE 0.9 % IR SOLN
Status: DC | PRN
  Administered 2020-08-25 (×3): 3000 mL

## 2020-08-25 MED ORDER — DIPHENHYDRAMINE HCL 50 MG/ML IJ SOLN: 12.5000 mg | Freq: Four times a day (QID) | INTRAMUSCULAR | Status: DC | PRN

## 2020-08-25 SURGICAL SUPPLY — 33 items
BAG DRAIN URO TABLE W/ADPT NS (BAG) ×4 IMPLANT
BAG HAMPER (MISCELLANEOUS) ×4 IMPLANT
BAG URINE DRAIN 2000ML AR STRL (UROLOGICAL SUPPLIES) ×4 IMPLANT
BAG URINE DRAIN TURP 4L (OSTOMY) ×4 IMPLANT
CATH FOLEY 3WAY 30CC 22F (CATHETERS) IMPLANT
CATH FOLEY LATEX FREE 22FR (CATHETERS)
CATH FOLEY LF 22FR (CATHETERS) IMPLANT
CATH HEMA 3WAY 30CC 22FR COUDE (CATHETERS) ×4 IMPLANT
CATH INTERMIT  6FR 70CM (CATHETERS) ×4 IMPLANT
CLOTH BEACON ORANGE TIMEOUT ST (SAFETY) ×4 IMPLANT
DECANTER SPIKE VIAL GLASS SM (MISCELLANEOUS) ×4 IMPLANT
ELECT LOOP 22F BIPOLAR SML (ELECTROSURGICAL) ×4
ELECT REM PT RETURN 9FT ADLT (ELECTROSURGICAL) ×4
ELECTRODE LOOP 22F BIPOLAR SML (ELECTROSURGICAL) ×2 IMPLANT
ELECTRODE REM PT RTRN 9FT ADLT (ELECTROSURGICAL) ×2 IMPLANT
GLOVE BIO SURGEON STRL SZ8 (GLOVE) ×4 IMPLANT
GLOVE BIOGEL PI IND STRL 7.0 (GLOVE) ×4 IMPLANT
GLOVE BIOGEL PI INDICATOR 7.0 (GLOVE) ×4
GLOVE ECLIPSE 6.5 STRL STRAW (GLOVE) ×4 IMPLANT
GOWN STRL REUS W/TWL LRG LVL3 (GOWN DISPOSABLE) ×4 IMPLANT
GOWN STRL REUS W/TWL XL LVL3 (GOWN DISPOSABLE) ×4 IMPLANT
GUIDEWIRE STR DUAL SENSOR (WIRE) IMPLANT
IV NS IRRIG 3000ML ARTHROMATIC (IV SOLUTION) ×20 IMPLANT
KIT TURNOVER CYSTO (KITS) ×4 IMPLANT
MANIFOLD NEPTUNE II (INSTRUMENTS) ×4 IMPLANT
PACK CYSTO (CUSTOM PROCEDURE TRAY) ×4 IMPLANT
PAD ARMBOARD 7.5X6 YLW CONV (MISCELLANEOUS) ×4 IMPLANT
PLUG CATH AND CAP STER (CATHETERS) ×4 IMPLANT
SYR 30ML LL (SYRINGE) ×4 IMPLANT
SYR TOOMEY IRRIG 70ML (MISCELLANEOUS) ×4
SYRINGE TOOMEY IRRIG 70ML (MISCELLANEOUS) ×2 IMPLANT
TOWEL OR 17X26 4PK STRL BLUE (TOWEL DISPOSABLE) ×4 IMPLANT
WATER STERILE IRR 500ML POUR (IV SOLUTION) ×4 IMPLANT

## 2020-08-25 NOTE — Op Note (Signed)
.  Preoperative diagnosis: bladder tumor  Postoperative diagnosis: Same  Procedure: 1 cystoscopy 2. Clot evacuation 3. Transurethral resection of bladder tumor, large  Attending: Rosie Fate  Anesthesia: General  Estimated blood loss: 50cc  Drains: 22 French foley  Specimens: bladder tumor  Antibiotics: ancef  Findings: multiple areas of irritation from foley catheter. 100cc clot in the bladder. Infiltrative bladder neck lesion 6cm obstructing bladder neck  Ureteral orifices in normal anatomic location.   Indications: Patient is a 69 year old male with a history of bladder tumor and gross hematuria.  After discussing treatment options, they decided proceed with transurethral resection of a bladder tumor.  Procedure her in detail: The patient was brought to the operating room and a brief timeout was done to ensure correct patient, correct procedure, correct site.  General anesthesia was administered patient was placed in dorsal lithotomy position.  Their genitalia was then prepped and draped in usual sterile fashion.  A rigid 75 French cystoscope was passed in the urethra and the bladder.  Bladder was inspected and we noted a large amount of clot and a 6cm bladder tumor.  the ureteral orifices were in the normal orthotopic locations.  We then removed the cystoscope and placed a resectoscope into the bladder. We proceeded to remove the large clot burden from the bladder. Once this was complete we turned our attention to the bladder tumor. Using the bipolar resectoscope we removed the bladder tumor involving the bladder neck. Hemostasis was then obtained with electrocautery. We then removed the bladder tumor chips and sent them for pathology. We then re-inspected the bladder and found no residula bleeding.  the bladder was then drained, a 22 French foley was placed and this concluded the procedure which was well tolerated by patient.  Complications: None  Condition: Stable, extubated,  transferred to PACU  Plan: Patient is admitted overnight with continuous bladder irrigation. If their urine is clear tomorrow they will be discharged home and followup in 5 days for foley catheter removal and pathology discussion.

## 2020-08-25 NOTE — Anesthesia Procedure Notes (Signed)
Procedure Name: Intubation Date/Time: 08/25/2020 12:21 PM Performed by: Gwyndolyn Saxon, CRNA Pre-anesthesia Checklist: Patient identified, Emergency Drugs available, Suction available and Patient being monitored Patient Re-evaluated:Patient Re-evaluated prior to induction Oxygen Delivery Method: Circle system utilized Preoxygenation: Pre-oxygenation with 100% oxygen Induction Type: IV induction Ventilation: Mask ventilation without difficulty and Oral airway inserted - appropriate to patient size Laryngoscope Size: Sabra Heck and 2 Grade View: Grade I Tube type: Oral Tube size: 7.5 mm Number of attempts: 1 Airway Equipment and Method: Patient positioned with wedge pillow and Stylet Placement Confirmation: ETT inserted through vocal cords under direct vision,  positive ETCO2 and breath sounds checked- equal and bilateral Secured at: 21 cm Tube secured with: Tape Dental Injury: Teeth and Oropharynx as per pre-operative assessment

## 2020-08-25 NOTE — Transfer of Care (Signed)
Immediate Anesthesia Transfer of Care Note  Patient: Tony Powell  Procedure(s) Performed: TRANSURETHRAL RESECTION OF BLADDER TUMOR (TURBT) (N/A Bladder)  Patient Location: PACU  Anesthesia Type:General  Level of Consciousness: awake, alert  and oriented  Airway & Oxygen Therapy: Patient Spontanous Breathing  Post-op Assessment: Report given to RN and Post -op Vital signs reviewed and stable  Post vital signs: Reviewed and stable  Last Vitals:  Vitals Value Taken Time  BP 159/77 08/25/20 1340  Temp    Pulse 83 08/25/20 1345  Resp 14 08/25/20 1345  SpO2 100 % 08/25/20 1345  Vitals shown include unvalidated device data.  Last Pain:  Vitals:   08/25/20 1107  TempSrc: Oral  PainSc: 0-No pain      Patients Stated Pain Goal: 8 (03/79/44 4619)  Complications: No complications documented.

## 2020-08-25 NOTE — Anesthesia Postprocedure Evaluation (Signed)
Anesthesia Post Note  Patient: Tony Powell  Procedure(s) Performed: TRANSURETHRAL RESECTION OF BLADDER TUMOR (TURBT) (N/A Bladder)  Patient location during evaluation: PACU Anesthesia Type: General Level of consciousness: awake and alert and oriented Pain management: pain level controlled Vital Signs Assessment: post-procedure vital signs reviewed and stable Respiratory status: spontaneous breathing and respiratory function stable Cardiovascular status: blood pressure returned to baseline and stable Postop Assessment: no apparent nausea or vomiting Anesthetic complications: no   No complications documented.   Last Vitals:  Vitals:   08/25/20 1400 08/25/20 1415  BP: (!) 159/77 (!) 147/130  Pulse: 79 75  Resp: 15 15  Temp:    SpO2: 98% 100%    Last Pain:  Vitals:   08/25/20 1415  TempSrc:   PainSc: 10-Worst pain ever                 Merisa Julio C Amarra Sawyer

## 2020-08-25 NOTE — Interval H&P Note (Signed)
History and Physical Interval Note:  08/25/2020 12:06 PM  Tony Powell  has presented today for surgery, with the diagnosis of bladder tumor.  The various methods of treatment have been discussed with the patient and family. After consideration of risks, benefits and other options for treatment, the patient has consented to  Procedure(s): CYSTOSCOPY WITH RETROGRADE PYELOGRAM (Bilateral) TRANSURETHRAL RESECTION OF BLADDER TUMOR (TURBT) (N/A) as a surgical intervention.  The patient's history has been reviewed, patient examined, no change in status, stable for surgery.  I have reviewed the patient's chart and labs.  Questions were answered to the patient's satisfaction.     [name]

## 2020-08-25 NOTE — Anesthesia Preprocedure Evaluation (Addendum)
Anesthesia Evaluation  Patient identified by MRN, date of birth, ID band Patient awake    Reviewed: Allergy & Precautions, NPO status , Patient's Chart, lab work & pertinent test results  History of Anesthesia Complications Negative for: history of anesthetic complications  Airway Mallampati: II  TM Distance: >3 FB Neck ROM: Full    Dental  (+) Missing, Dental Advisory Given   Pulmonary former smoker,    Pulmonary exam normal breath sounds clear to auscultation       Cardiovascular Exercise Tolerance: Good hypertension, Pt. on medications Normal cardiovascular exam+ dysrhythmias (PVCs)  Rhythm:Regular Rate:Normal  24-Aug-2020 09:58:32 [facility] System-AP-300 ROUTINE RECORD Sinus rhythm with occasional Premature ventricular complexes Nonspecific T wave abnormality Prolonged QT Abnormal ECG Confirmed by Asencion Noble [phone]) on 08/24/2020 9:44:30 PM   Neuro/Psych negative neurological ROS  negative psych ROS   GI/Hepatic negative GI ROS, Neg liver ROS,   Endo/Other  negative endocrine ROS  Renal/GU Renal InsufficiencyRenal disease   Bladder cancer     Musculoskeletal  (+) Arthritis  (back pain),   Abdominal   Peds  Hematology negative hematology ROS (+)   Anesthesia Other Findings   Reproductive/Obstetrics negative OB ROS                             Anesthesia Physical Anesthesia Plan  ASA: III  Anesthesia Plan: General   Post-op Pain Management:    Induction: Intravenous  PONV Risk Score and Plan: 3 and Ondansetron  Airway Management Planned: Oral ETT  Additional Equipment:   Intra-op Plan:   Post-operative Plan: Extubation in OR  Informed Consent: I have reviewed the patients History and Physical, chart, labs and discussed the procedure including the risks, benefits and alternatives for the proposed anesthesia with the patient or authorized representative who has  indicated his/her understanding and acceptance.     Dental advisory given  Plan Discussed with: CRNA and Surgeon  Anesthesia Plan Comments:        Anesthesia Quick Evaluation

## 2020-08-26 ENCOUNTER — Other Ambulatory Visit: Payer: Self-pay | Admitting: Urology

## 2020-08-26 ENCOUNTER — Encounter (HOSPITAL_COMMUNITY): Payer: Self-pay | Admitting: Urology

## 2020-08-26 ENCOUNTER — Other Ambulatory Visit: Payer: Self-pay

## 2020-08-26 DIAGNOSIS — D494 Neoplasm of unspecified behavior of bladder: Secondary | ICD-10-CM

## 2020-08-26 DIAGNOSIS — I10 Essential (primary) hypertension: Secondary | ICD-10-CM | POA: Diagnosis not present

## 2020-08-26 DIAGNOSIS — C679 Malignant neoplasm of bladder, unspecified: Secondary | ICD-10-CM | POA: Diagnosis not present

## 2020-08-26 DIAGNOSIS — R310 Gross hematuria: Secondary | ICD-10-CM | POA: Diagnosis not present

## 2020-08-26 DIAGNOSIS — Z87891 Personal history of nicotine dependence: Secondary | ICD-10-CM | POA: Diagnosis not present

## 2020-08-26 LAB — BASIC METABOLIC PANEL
Anion gap: 9 (ref 5–15)
BUN: 14 mg/dL (ref 8–23)
CO2: 27 mmol/L (ref 22–32)
Calcium: 9 mg/dL (ref 8.9–10.3)
Chloride: 103 mmol/L (ref 98–111)
Creatinine, Ser: 1.94 mg/dL — ABNORMAL HIGH (ref 0.61–1.24)
GFR, Estimated: 34 mL/min — ABNORMAL LOW (ref >60)
Glucose, Bld: 131 mg/dL — ABNORMAL HIGH (ref 70–99)
Potassium: 4.7 mmol/L (ref 3.5–5.1)
Sodium: 139 mmol/L (ref 135–145)

## 2020-08-26 LAB — CBC
HCT: 30.2 % — ABNORMAL LOW (ref 39.0–52.0)
Hemoglobin: 9.5 g/dL — ABNORMAL LOW (ref 13.0–17.0)
MCH: 26.9 pg (ref 26.0–34.0)
MCHC: 31.5 g/dL (ref 30.0–36.0)
MCV: 85.6 fL (ref 80.0–100.0)
Platelets: 310 10*3/uL (ref 150–400)
RBC: 3.53 MIL/uL — ABNORMAL LOW (ref 4.22–5.81)
RDW: 14.5 % (ref 11.5–15.5)
WBC: 11.7 10*3/uL — ABNORMAL HIGH (ref 4.0–10.5)
nRBC: 0 % (ref 0.0–0.2)

## 2020-08-26 MED ORDER — OXYCODONE-ACETAMINOPHEN 5-325 MG PO TABS: 1.0000 | ORAL_TABLET | ORAL | 0 refills | Status: DC | PRN

## 2020-08-26 MED ORDER — ONDANSETRON 4 MG PO TBDP: 4.0000 mg | ORAL_TABLET | Freq: Three times a day (TID) | ORAL | 0 refills | Status: DC | PRN

## 2020-08-26 NOTE — Progress Notes (Signed)
NURSING PROGRESS NOTE  Tony Powell [id number] Discharge Data: 08/26/2020 10:28 AM Attending Provider: Cleon Gustin, MD WHQ:[id number], Lowell Guitar, DO     [name] to be D/C'd Home per MD order. Pt discharged with leg bag.  Discussed with the patient the After Visit Summary and all questions fully answered. All IV's discontinued with no bleeding noted. All belongings returned to patient for patient to take home.   Last Vital Signs:  Blood pressure (!) 144/79, pulse 77, temperature 98.6 F (37 C), temperature source Oral, resp. rate 16, SpO2 95 %.  Discharge Medication List Allergies as of 08/26/2020      Reactions   Celebrex [celecoxib]    Eyes drooping and messed up skin on hands      Medication List    TAKE these medications   Aleve 220 MG tablet Generic drug: naproxen sodium Take 440 mg by mouth daily as needed (pain).   alfuzosin 10 MG 24 hr tablet Commonly known as: UROXATRAL Take 1 tablet (10 mg total) by mouth daily with breakfast.   CRAMP TABS PO Take 2-3 tablets by mouth at bedtime as needed (cramps).   finasteride 5 MG tablet Commonly known as: PROSCAR Take 5 mg by mouth daily. Notes to patient: Next dose due tomorrow   ondansetron 4 MG disintegrating tablet Commonly known as: Zofran ODT Take 1 tablet (4 mg total) by mouth every 8 (eight) hours as needed for nausea or vomiting.   oxyCODONE-acetaminophen 5-325 MG tablet Commonly known as: Percocet Take 1 tablet by mouth every 4 (four) hours as needed for severe pain. Notes to patient: Your last dose was at 9:15 am   sildenafil 100 MG tablet Commonly known as: Viagra Take one half to one tablet by mouth every day as needed for ED.   tamsulosin 0.4 MG Caps capsule Commonly known as: FLOMAX Take 1 capsule (0.4 mg total) by mouth at bedtime.   verapamil 240 MG CR tablet Commonly known as: CALAN-SR TAKE ONE (1) TABLET (240 MG TOTAL) BY MOUTH AT BEDTIME. What changed:   how much to take  how to  take this  when to take this  additional instructions   vitamin B-12 1000 MCG tablet Commonly known as: CYANOCOBALAMIN Take 1,000 mcg by mouth daily.        Doristine Devoid, RN

## 2020-08-26 NOTE — Discharge Instructions (Signed)
Indwelling Urinary Catheter Care, Adult An indwelling urinary catheter is a thin tube that is put into your bladder. The tube helps to drain pee (urine) out of your body. The tube goes in through your urethra. Your urethra is where pee comes out of your body. Your pee will come out through the catheter, then it will go into a bag (drainage bag). Take good care of your catheter so it will work well. How to wear your catheter and bag Supplies needed  Sticky tape (adhesive tape) or a leg strap.  Alcohol wipe or soap and water (if you use tape).  A clean towel (if you use tape).  Large overnight bag.  Smaller bag (leg bag). Wearing your catheter Attach your catheter to your leg with tape or a leg strap.  Make sure the catheter is not pulled tight.  If a leg strap gets wet, take it off and put on a dry strap.  If you use tape to hold the bag on your leg: 1. Use an alcohol wipe or soap and water to wash your skin where the tape made it sticky before. 2. Use a clean towel to pat-dry that skin. 3. Use new tape to make the bag stay on your leg. Wearing your bags You should have been given a large overnight bag.  You may wear the overnight bag in the day or night.  Always have the overnight bag lower than your bladder.  Do not let the bag touch the floor.  Before you go to sleep, put a clean plastic bag in a wastebasket. Then hang the overnight bag inside the wastebasket. You should also have a smaller leg bag that fits under your clothes.  Always wear the leg bag below your knee.  Do not wear your leg bag at night. How to care for your skin and catheter Supplies needed  A clean washcloth.  Water and mild soap.  A clean towel. Caring for your skin and catheter      Clean the skin around your catheter every day: 1. Wash your hands with soap and water. 2. Wet a clean washcloth in warm water and mild soap. 3. Clean the skin around your urethra.  If you are  male:  Gently spread the folds of skin around your vagina (labia).  With the washcloth in your other hand, wipe the inner side of your labia on each side. Wipe from front to back.  If you are male:  Pull back any skin that covers the end of your penis (foreskin).  With the washcloth in your other hand, wipe your penis in small circles. Start wiping at the tip of your penis, then move away from the catheter.  Move the foreskin back in place, if needed. 4. With your free hand, hold the catheter close to where it goes into your body.  Keep holding the catheter during cleaning so it does not get pulled out. 5. With the washcloth in your other hand, clean the catheter.  Only wipe downward on the catheter.  Do not wipe upward toward your body. Doing this may push germs into your urethra and cause infection. 6. Use a clean towel to pat-dry the catheter and the skin around it. Make sure to wipe off all soap. 7. Wash your hands with soap and water.  Shower every day. Do not take baths.  Do not use cream, ointment, or lotion on the area where the catheter goes into your body, unless your doctor tells you   to.  Do not use powders, sprays, or lotions on your genital area.  Check your skin around the catheter every day for signs of infection. Check for: ? Redness, swelling, or pain. ? Fluid or blood. ? Warmth. ? Pus or a bad smell. How to empty the bag Supplies needed  Rubbing alcohol.  Gauze pad or cotton ball.  Tape or a leg strap. Emptying the bag Pour the pee out of your bag when it is ?- full, or at least 2-3 times a day. Do this for your overnight bag and your leg bag. 1. Wash your hands with soap and water. 2. Separate (detach) the bag from your leg. 3. Hold the bag over the toilet or a clean pail. Keep the bag lower than your hips and bladder. This is so the pee (urine) does not go back into the tube. 4. Open the pour spout. It is at the bottom of the bag. 5. Empty the  pee into the toilet or pail. Do not let the pour spout touch any surface. 6. Put rubbing alcohol on a gauze pad or cotton ball. 7. Use the gauze pad or cotton ball to clean the pour spout. 8. Close the pour spout. 9. Attach the bag to your leg with tape or a leg strap. 10. Wash your hands with soap and water. Follow instructions for cleaning the drainage bag:  From the product maker.  As told by your doctor. How to change the bag Supplies needed  Alcohol wipes.  A clean bag.  Tape or a leg strap. Changing the bag Replace your bag when it starts to leak, smell bad, or look dirty. 1. Wash your hands with soap and water. 2. Separate the dirty bag from your leg. 3. Pinch the catheter with your fingers so that pee does not spill out. 4. Separate the catheter tube from the bag tube where these tubes connect (at the connection valve). Do not let the tubes touch any surface. 5. Clean the end of the catheter tube with an alcohol wipe. Use a different alcohol wipe to clean the end of the bag tube. 6. Connect the catheter tube to the tube of the clean bag. 7. Attach the clean bag to your leg with tape or a leg strap. Do not make the bag tight on your leg. 8. Wash your hands with soap and water. General rules   Never pull on your catheter. Never try to take it out. Doing that can hurt you.  Always wash your hands before and after you touch your catheter or bag. Use a mild, fragrance-free soap. If you do not have soap and water, use hand sanitizer.  Always make sure there are no twists or bends (kinks) in the catheter tube.  Always make sure there are no leaks in the catheter or bag.  Drink enough fluid to keep your pee pale yellow.  Do not take baths, swim, or use a hot tub.  If you are male, wipe from front to back after you poop (have a bowel movement). Contact a doctor if:  Your pee is cloudy.  Your pee smells worse than usual.  Your catheter gets clogged.  Your catheter  leaks.  Your bladder feels full. Get help right away if:  You have redness, swelling, or pain where the catheter goes into your body.  You have fluid, blood, pus, or a bad smell coming from the area where the catheter goes into your body.  Your skin feels warm where   the catheter goes into your body.  You have a fever.  You have pain in your: ? Belly (abdomen). ? Legs. ? Lower back. ? Bladder.  You see blood in the catheter.  Your pee is pink or red.  You feel sick to your stomach (nauseous).  You throw up (vomit).  You have chills.  Your pee is not draining into the bag.  Your catheter gets pulled out. Summary  An indwelling urinary catheter is a thin tube that is placed into the bladder to help drain pee (urine) out of the body.  The catheter is placed into the part of the body that drains pee from the bladder (urethra).  Taking good care of your catheter will keep it working properly and help prevent problems.  Always wash your hands before and after touching your catheter or bag.  Never pull on your catheter or try to take it out. This information is not intended to replace advice given to you by your health care provider. Make sure you discuss any questions you have with your health care provider. Document Revised: 02/21/2019 Document Reviewed: 06/15/2017 Elsevier Patient Education  2020 Elsevier Inc.  

## 2020-08-27 ENCOUNTER — Encounter: Payer: Self-pay | Admitting: Urology

## 2020-08-27 LAB — SURGICAL PATHOLOGY

## 2020-08-27 NOTE — Patient Instructions (Signed)
Hematuria, Adult Hematuria is blood in the urine. Blood may be visible in the urine, or it may be identified with a test. This condition can be caused by infections of the bladder, urethra, kidney, or prostate. Other possible causes include:  Kidney stones.  Cancer of the urinary tract.  Too much calcium in the urine.  Conditions that are passed from parent to child (inherited conditions).  Exercise that requires a lot of energy. Infections can usually be treated with medicine, and a kidney stone usually will pass through your urine. If neither of these is the cause of your hematuria, more tests may be needed to identify the cause of your symptoms. It is very important to tell your health care provider about any blood in your urine, even if it is painless or the blood stops without treatment. Blood in the urine, when it happens and then stops and then happens again, can be a symptom of a very serious condition, including cancer. There is no pain in the initial stages of many urinary cancers. Follow these instructions at home: Medicines  Take over-the-counter and prescription medicines only as told by your health care provider.  If you were prescribed an antibiotic medicine, take it as told by your health care provider. Do not stop taking the antibiotic even if you start to feel better. Eating and drinking  Drink enough fluid to keep your urine clear or pale yellow. It is recommended that you drink 3-4 quarts (2.8-3.8 L) a day. If you have been diagnosed with an infection, it is recommended that you drink cranberry juice in addition to large amounts of water.  Avoid caffeine, tea, and carbonated beverages. These tend to irritate the bladder.  Avoid alcohol because it may irritate the prostate (men). General instructions  If you have been diagnosed with a kidney stone, follow your health care provider's instructions about straining your urine to catch the stone.  Empty your bladder  often. Avoid holding urine for long periods of time.  If you are male: ? After a bowel movement, wipe from front to back and use each piece of toilet paper only once. ? Empty your bladder before and after sex.  Pay attention to any changes in your symptoms. Tell your health care provider about any changes or any new symptoms.  It is your responsibility to get your test results. Ask your health care provider, or the department performing the test, when your results will be ready.  Keep all follow-up visits as told by your health care provider. This is important. Contact a health care provider if:  You develop back pain.  You have a fever.  You have nausea or vomiting.  Your symptoms do not improve after 3 days.  Your symptoms get worse. Get help right away if:  You develop severe vomiting and are unable take medicine without vomiting.  You develop severe pain in your back or abdomen even though you are taking medicine.  You pass a large amount of blood in your urine.  You pass blood clots in your urine.  You feel very weak or like you might faint.  You faint. Summary  Hematuria is blood in the urine. It has many possible causes.  It is very important that you tell your health care provider about any blood in your urine, even if it is painless or the blood stops without treatment.  Take over-the-counter and prescription medicines only as told by your health care provider.  Drink enough fluid to keep   your urine clear or pale yellow. This information is not intended to replace advice given to you by your health care provider. Make sure you discuss any questions you have with your health care provider. Document Revised: 03/26/2019 Document Reviewed: 12/02/2016 Elsevier Patient Education  2020 Elsevier Inc.  

## 2020-08-27 NOTE — Patient Instructions (Signed)

## 2020-08-28 NOTE — Discharge Summary (Signed)
Physician Discharge Summary  Patient ID: Tony Powell MRN: [id number] DOB/AGE: Apr 08, 1951 69 y.o.  Admit date: 08/25/2020 Discharge date: 08/26/2020  Admission Diagnoses:  Gross heamturia  Discharge Diagnoses:  Active Problems:   Gross hematuria   Past Medical History:  Diagnosis Date  . Abnormal stress test   . Borderline hypertension   . Chest discomfort    Atypical  . DJD (degenerative joint disease)    Knees, lumbar sacral spine  . Hyperlipidemia   . Hypertension     Surgeries: Procedure(s): TRANSURETHRAL RESECTION OF BLADDER TUMOR (TURBT) on 08/25/2020   Consultants (if any):   Discharged Condition: Improved  Hospital Course: Tony Powell is an 69 y.o. male who was admitted 08/25/2020 with a diagnosis of gross hematuria and went to the operating room on 08/25/2020 and underwent the above named procedures.    He was given perioperative antibiotics:  Anti-infectives (From admission, onward)   Start     Dose/Rate Route Frequency Ordered Stop   08/25/20 2230  cefTRIAXone (ROCEPHIN) 2 g in sodium chloride 0.9 % 100 mL IVPB  Status:  Discontinued        2 g 200 mL/hr over 30 Minutes Intravenous Every 24 hours 08/25/20 2133 08/26/20 1532   08/25/20 1041  ceFAZolin (ANCEF) IVPB 2g/100 mL premix        2 g 200 mL/hr over 30 Minutes Intravenous 30 min pre-op 08/25/20 1041 08/25/20 1252    .  He was given sequential compression devices, early ambulation, for DVT prophylaxis.  He benefited maximally from the hospital stay and there were no complications.    Recent vital signs:  Vitals:   08/25/20 2357 08/26/20 0246  BP: (!) 150/79 (!) 144/79  Pulse: 81 77  Resp: 17 16  Temp: 98.6 F (37 C) 98.6 F (37 C)  SpO2: 97% 95%    Recent laboratory studies:  Lab Results  Component Value Date   HGB 9.5 (L) 08/26/2020   HGB 11.0 (L) 08/21/2020   HGB 13.6 12/18/2018   Lab Results  Component Value Date   WBC 11.7 (H) 08/26/2020   PLT 310 08/26/2020   No  results found for: INR Lab Results  Component Value Date   NA 139 08/26/2020   K 4.7 08/26/2020   CL 103 08/26/2020   CO2 27 08/26/2020   BUN 14 08/26/2020   CREATININE 1.94 (H) 08/26/2020   GLUCOSE 131 (H) 08/26/2020    Discharge Medications:   Allergies as of 08/26/2020      Reactions   Celebrex [celecoxib]    Eyes drooping and messed up skin on hands      Medication List    TAKE these medications   Aleve 220 MG tablet Generic drug: naproxen sodium Take 440 mg by mouth daily as needed (pain).   alfuzosin 10 MG 24 hr tablet Commonly known as: UROXATRAL Take 1 tablet (10 mg total) by mouth daily with breakfast.   CRAMP TABS PO Take 2-3 tablets by mouth at bedtime as needed (cramps).   finasteride 5 MG tablet Commonly known as: PROSCAR Take 5 mg by mouth daily. Notes to patient: Next dose due tomorrow   sildenafil 100 MG tablet Commonly known as: Viagra Take one half to one tablet by mouth every day as needed for ED.   tamsulosin 0.4 MG Caps capsule Commonly known as: FLOMAX Take 1 capsule (0.4 mg total) by mouth at bedtime.   verapamil 240 MG CR tablet Commonly known as: CALAN-SR TAKE ONE (1)  TABLET (240 MG TOTAL) BY MOUTH AT BEDTIME. What changed:   how much to take  how to take this  when to take this  additional instructions   vitamin B-12 1000 MCG tablet Commonly known as: CYANOCOBALAMIN Take 1,000 mcg by mouth daily.       Diagnostic Studies: No results found.  Disposition: Discharge disposition: 01-Home or Self Care       Discharge Instructions    Discharge patient   Complete by: As directed    Discharge disposition: 01-Home or Self Care   Discharge patient date: 08/26/2020       Follow-up Information    Canisha Issac, Candee Furbish, MD Follow up on 08/26/2020.   Specialty: Urology Why: 08/31/2020 for voiding trial Contact information: [address]  [place] [address] [phone]                 Signed: [name] 08/28/2020, 9:34 AM

## 2020-08-30 ENCOUNTER — Encounter: Payer: Self-pay | Admitting: Family Medicine

## 2020-08-30 ENCOUNTER — Telehealth: Payer: Self-pay

## 2020-08-30 DIAGNOSIS — C61 Malignant neoplasm of prostate: Secondary | ICD-10-CM | POA: Insufficient documentation

## 2020-08-31 ENCOUNTER — Other Ambulatory Visit: Payer: Self-pay | Admitting: Urology

## 2020-08-31 MED ORDER — OXYCODONE-ACETAMINOPHEN 5-325 MG PO TABS: 1.0000 | ORAL_TABLET | ORAL | 0 refills | Status: DC | PRN

## 2020-08-31 NOTE — Telephone Encounter (Signed)
Refill sent.

## 2020-09-01 ENCOUNTER — Other Ambulatory Visit: Payer: Self-pay

## 2020-09-01 ENCOUNTER — Ambulatory Visit (INDEPENDENT_AMBULATORY_CARE_PROVIDER_SITE_OTHER): Payer: Medicare Other | Admitting: Urology

## 2020-09-01 ENCOUNTER — Encounter: Payer: Self-pay | Admitting: Urology

## 2020-09-01 VITALS — BP 194/93 | HR 70 | Temp 98.1°F

## 2020-09-01 DIAGNOSIS — C61 Malignant neoplasm of prostate: Secondary | ICD-10-CM | POA: Diagnosis not present

## 2020-09-01 NOTE — Progress Notes (Signed)
09/01/2020 9:35 AM   Tony Powell [birth year] [id number]  Referring provider: Erven Colla, DO [address] [place],  [place] [address]  followup after bladder tumor resection  HPI: Tony Powell is a 69yo here for followup after bladder tumor resection. Pathology revealed Gleason 9 prostate cancer. He passed his voiding trial today. Urine clear. NO bone pain    PMH: Past Medical History:  Diagnosis Date  . Abnormal stress test   . Borderline hypertension   . Chest discomfort    Atypical  . DJD (degenerative joint disease)    Knees, lumbar sacral spine  . Hyperlipidemia   . Hypertension     Surgical History: Past Surgical History:  Procedure Laterality Date  . BACK SURGERY    . TRANSURETHRAL RESECTION OF BLADDER TUMOR N/A 08/25/2020   Procedure: TRANSURETHRAL RESECTION OF BLADDER TUMOR (TURBT);  Surgeon: Cleon Gustin, MD;  Location: [facility];  Service: Urology;  Laterality: N/A;    Home Medications:  Allergies as of 09/01/2020      Reactions   Celebrex [celecoxib]    Eyes drooping and messed up skin on hands      Medication List       Accurate as of September 01, 2020  9:35 AM. If you have any questions, ask your nurse or doctor.        Aleve 220 MG tablet Generic drug: naproxen sodium Take 440 mg by mouth daily as needed (pain).   alfuzosin 10 MG 24 hr tablet Commonly known as: UROXATRAL Take 1 tablet (10 mg total) by mouth daily with breakfast.   CRAMP TABS PO Take 2-3 tablets by mouth at bedtime as needed (cramps).   finasteride 5 MG tablet Commonly known as: PROSCAR Take 5 mg by mouth daily.   ondansetron 4 MG disintegrating tablet Commonly known as: Zofran ODT Take 1 tablet (4 mg total) by mouth every 8 (eight) hours as needed for nausea or vomiting.   oxyCODONE-acetaminophen 5-325 MG tablet Commonly known as: PERCOCET/ROXICET TAKE ONE (1) TABLET BY MOUTH EVERY FOUR (4) (FOUR) HOURS AS NEEDED FOR SEVERE PAIN.   oxyCODONE-acetaminophen  5-325 MG tablet Commonly known as: Percocet Take 1 tablet by mouth every 4 (four) hours as needed for severe pain.   sildenafil 100 MG tablet Commonly known as: Viagra Take one half to one tablet by mouth every day as needed for ED.   tamsulosin 0.4 MG Caps capsule Commonly known as: FLOMAX Take 1 capsule (0.4 mg total) by mouth at bedtime.   verapamil 240 MG CR tablet Commonly known as: CALAN-SR TAKE ONE (1) TABLET (240 MG TOTAL) BY MOUTH AT BEDTIME. What changed:   how much to take  how to take this  when to take this  additional instructions   vitamin B-12 1000 MCG tablet Commonly known as: CYANOCOBALAMIN Take 1,000 mcg by mouth daily.       Allergies:  Allergies  Allergen Reactions  . Celebrex [Celecoxib]     Eyes drooping and messed up skin on hands    Family History: Family History  Problem Relation Age of Onset  . Heart attack Mother   . Breast cancer Mother        Mastectomy  . Leukemia Father     Social History:  reports that he quit smoking about 21 years ago. He has a 20.00 pack-year smoking history. He has never used smokeless tobacco. He reports that he does not drink alcohol and does not use drugs.  ROS: All  other review of systems were reviewed and are negative except what is noted above in HPI  Physical Exam: BP (!) 194/93   Pulse 70   Temp 98.1 F (36.7 C)   Constitutional:  Alert and oriented, No acute distress. HEENT: [place] AT, moist mucus membranes.  Trachea midline, no masses. Cardiovascular: No clubbing, cyanosis, or edema. Respiratory: Normal respiratory effort, no increased work of breathing. GI: Abdomen is soft, nontender, nondistended, no abdominal masses GU: No CVA tenderness.  Lymph: No cervical or inguinal lymphadenopathy. Skin: No rashes, bruises or suspicious lesions. Neurologic: Grossly intact, no focal deficits, moving all 4 extremities. Psychiatric: Normal mood and affect.  Laboratory Data: Lab Results  Component  Value Date   WBC 11.7 (H) 08/26/2020   HGB 9.5 (L) 08/26/2020   HCT 30.2 (L) 08/26/2020   MCV 85.6 08/26/2020   PLT 310 08/26/2020    Lab Results  Component Value Date   CREATININE 1.94 (H) 08/26/2020    No results found for: PSA  No results found for: TESTOSTERONE  No results found for: HGBA1C  Urinalysis    Component Value Date/Time   APPEARANCEUR Cloudy (A) 08/13/2020 1042   GLUCOSEU Negative 08/13/2020 1042   BILIRUBINUR Negative 08/13/2020 1042   PROTEINUR 2+ (A) 08/13/2020 1042   NITRITE Positive (A) 08/13/2020 1042   LEUKOCYTESUR 2+ (A) 08/13/2020 1042    Lab Results  Component Value Date   LABMICR See below: 08/13/2020   WBCUA 11-30 (A) 08/13/2020   LABEPIT None seen 08/13/2020   BACTERIA None seen 08/13/2020    Pertinent Imaging:  No results found for this or any previous visit.  No results found for this or any previous visit.  No results found for this or any previous visit.  No results found for this or any previous visit.  No results found for this or any previous visit.  No results found for this or any previous visit.  No results found for this or any previous visit.  No results found for this or any previous visit.   Assessment & Plan:    1. Prostate cancer [facility]) I discussed the natural history of high risk prostate cancer with the patient and the various treatment options including active surveillance, RALP, IMRT, brachytherapy, cryotherapy, HIFU and ADT. We will obtain CT and bone scan prior to his appointment with Dr. Tammi Klippel  - PSA - Basic metabolic panel - CT Abdomen Pelvis W Wo Contrast; Future - NM Bone Scan Whole Body; Future - Ambulatory referral to Radiation Oncology   Return in about 4 weeks (around 09/29/2020).  [name], MD  [facility] [place]

## 2020-09-01 NOTE — Patient Instructions (Signed)
Prostate Cancer  The prostate is a male gland that helps make semen. Prostate cancer is when abnormal cells grow in this gland. Follow these instructions at home:  Take over-the-counter and prescription medicines only as told by your doctor.  Eat a healthy diet.  Get plenty of sleep.  Ask your doctor for help to find a support group for men with prostate cancer.  Keep all follow-up visits as told by your doctor. This is important.  If you have to go to the hospital, let your cancer doctor (oncologist) know.  Touch, hold, hug, and caress your partner to continue to show sexual feelings. Contact a doctor if:  You have trouble peeing (urinating).  You have blood in your pee (urine).  You have pain in your hips, back, or chest. Get help right away if:  You have weakness in your legs.  You lose feeling (have numbness) in your legs.  You cannot control your pee or your poop (stool).  You have trouble breathing.  You have sudden pain in your chest.  You have chills or a fever. Summary  The prostate is a male gland that helps make semen. Prostate cancer is when abnormal cells grow in this gland.  Ask your doctor for help to find a support group for men with prostate cancer.  Contact a doctor if you have problems peeing or have any new pain that you did not have before. This information is not intended to replace advice given to you by your health care provider. Make sure you discuss any questions you have with your health care provider. Document Revised: 10/12/2017 Document Reviewed: 07/10/2016 Elsevier Patient Education  2020 Elsevier Inc.  

## 2020-09-01 NOTE — Progress Notes (Signed)
Fill and Pull Catheter Removal  Patient is present today for a catheter removal.  Patient was cleaned and prepped in a sterile fashion 3ml of sterile water/ saline was instilled into the bladder when the patient started to have bladder spasms . 51ml of water was then drained from the balloon.  A 22 coudeFR foley cath was removed from the bladder no complications were noted .  Pt had bladder spasms with 52ml of water and unable to instill anymore. Dr. Alyson Ingles aware. Catheter was removed without difficulty.  Performed by: Park Pope RN  Follow up/ Additional notes: MD To discuss with patient.    Urological Symptom Review  Patient is experiencing the following symptoms: none  Review of Systems  Gastrointestinal (upper)  : Negative for upper GI symptoms  Gastrointestinal (lower) : Negative for lower GI symptoms  Constitutional : Negative for symptoms  Skin: Negative for skin symptoms  Eyes: Negative for eye symptoms  Ear/Nose/Throat : Negative for Ear/Nose/Throat symptoms  Hematologic/Lymphatic: Negative for Hematologic/Lymphatic symptoms  Cardiovascular : Negative for cardiovascular symptoms  Respiratory : Negative for respiratory symptoms  Endocrine: Negative for endocrine symptoms  Musculoskeletal: Negative for musculoskeletal symptoms  Neurological: Negative for neurological symptoms  Psychologic Negative for psychologic

## 2020-09-02 LAB — BASIC METABOLIC PANEL
BUN/Creatinine Ratio: 7 — ABNORMAL LOW (ref 10–24)
BUN: 8 mg/dL (ref 8–27)
CO2: 26 mmol/L (ref 20–29)
Calcium: 9.6 mg/dL (ref 8.6–10.2)
Chloride: 100 mmol/L (ref 96–106)
Creatinine, Ser: 1.17 mg/dL (ref 0.76–1.27)
GFR calc Af Amer: 73 mL/min/{1.73_m2} (ref >59)
GFR calc non Af Amer: 63 mL/min/{1.73_m2} (ref >59)
Glucose: 110 mg/dL — ABNORMAL HIGH (ref 65–99)
Potassium: 4.3 mmol/L (ref 3.5–5.2)
Sodium: 145 mmol/L — ABNORMAL HIGH (ref 134–144)

## 2020-09-02 LAB — PSA: Prostate Specific Ag, Serum: 2.7 ng/mL (ref 0.0–4.0)

## 2020-09-07 ENCOUNTER — Encounter (HOSPITAL_COMMUNITY): Payer: Self-pay | Admitting: Emergency Medicine

## 2020-09-07 ENCOUNTER — Other Ambulatory Visit: Payer: Self-pay

## 2020-09-07 ENCOUNTER — Emergency Department (HOSPITAL_COMMUNITY)
Admission: EM | Admit: 2020-09-07 | Discharge: 2020-09-07 | Disposition: A | Discharge status: Home / Self Care | Payer: Medicare Other | Attending: Emergency Medicine | Admitting: Emergency Medicine

## 2020-09-07 DIAGNOSIS — C675 Malignant neoplasm of bladder neck: Secondary | ICD-10-CM | POA: Insufficient documentation

## 2020-09-07 DIAGNOSIS — R339 Retention of urine, unspecified: Secondary | ICD-10-CM | POA: Insufficient documentation

## 2020-09-07 DIAGNOSIS — Z79899 Other long term (current) drug therapy: Secondary | ICD-10-CM | POA: Insufficient documentation

## 2020-09-07 DIAGNOSIS — C61 Malignant neoplasm of prostate: Secondary | ICD-10-CM | POA: Insufficient documentation

## 2020-09-07 DIAGNOSIS — Z87891 Personal history of nicotine dependence: Secondary | ICD-10-CM | POA: Insufficient documentation

## 2020-09-07 DIAGNOSIS — I10 Essential (primary) hypertension: Secondary | ICD-10-CM | POA: Diagnosis not present

## 2020-09-07 DIAGNOSIS — R1030 Lower abdominal pain, unspecified: Secondary | ICD-10-CM | POA: Insufficient documentation

## 2020-09-07 LAB — URINALYSIS, ROUTINE W REFLEX MICROSCOPIC
Bacteria, UA: NONE SEEN
Bilirubin Urine: NEGATIVE
Glucose, UA: NEGATIVE mg/dL
Ketones, ur: NEGATIVE mg/dL
Nitrite: NEGATIVE
Protein, ur: 100 mg/dL — AB
RBC / HPF: >50 RBC/hpf — ABNORMAL HIGH (ref 0–5)
Specific Gravity, Urine: 1.02 (ref 1.005–1.030)
WBC, UA: >50 WBC/hpf — ABNORMAL HIGH (ref 0–5)
pH: 6 (ref 5.0–8.0)

## 2020-09-07 NOTE — Discharge Instructions (Signed)
Call Dr McKenzie's office to make an appointment this week for your ER visit.  We spoke to Dr Junious Silk (another urologist) on the phone today.  He advised that you continue wearing the foley catheter until you see Dr Alyson Ingles in the office this week.  Drink lots of water and flush the foley regularly at home.    You should still get your outpatient CT scan and bone studies done in early November as scheduled.

## 2020-09-07 NOTE — ED Notes (Addendum)
Pt switched to leg bag at discharge. Pt verbalized understanding of discharge instructions and how to care for foley, leg bag,etc.. Pt educated on signs and symptoms of infection. Pt reports already has standard drainage bag at home to use at night and refused hospital drainage bag.

## 2020-09-07 NOTE — ED Triage Notes (Signed)
Pt reports being unable to urinate since 1830 last night.

## 2020-09-07 NOTE — ED Provider Notes (Signed)
[facility] EMERGENCY DEPARTMENT Provider Note   CSN: [id number] Arrival date & time: 09/07/20  0736     History CC:  Cannot urinate  Tony Powell is a 69 y.o. male history of prostate and bladder cancer status post resection on 08/25/2020 (with Dr [name]), presenting to the ED with difficulty urinating.  Patient reports that he has noticed that his urine has turned bloody and red for the past several days.  He says this intermittently happens to him, and he often pees out large blood clots.  He passed one such clot yesterday.  However beginning yesterday evening into this morning, he has been unable to void whatsoever.  He was reporting suprapubic pressure and pain this morning.  He did take pain pills prior to coming to the ER.  He denies any fevers or chills.  He did have a Foley catheter placed after his bladder tumor removal in October, but the catheter was removed a week later on 09/01/20.  Per medical chart review and the operative note by Dr. Alyson Ingles on October 13, the patient underwent transurethral resection of a large bladder tumor and clot evaluation, with a 22 french foley placed afterwards.  The tumor involves the bladder neck.  The patient was scheduled for a CT abdomen pelvis with IV contrast on 09/22/20 and a bone scan on 09/21/20.    HPI     Past Medical History:  Diagnosis Date  . Abnormal stress test   . Borderline hypertension   . Chest discomfort    Atypical  . DJD (degenerative joint disease)    Knees, lumbar sacral spine  . Hyperlipidemia   . Hypertension     Patient Active Problem List   Diagnosis Date Noted  . Prostate cancer ([place]) 08/30/2020  . Bladder tumor 08/23/2020  . Gross hematuria 08/19/2020  . Difficulty voiding 08/13/2020  . 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/13/2020  . History of prostatitis 05/02/2020  . HYPERLIPIDEMIA 05/04/2010  . OVERWEIGHT 05/04/2010  . HYPERTENSION, BORDERLINE 04/28/2010  .  DEGENERATIVE JOINT DISEASE 04/28/2010  . CHEST PAIN 04/28/2010  . ABNORMAL STRESS ELECTROCARDIOGRAM 04/28/2010    Past Surgical History:  Procedure Laterality Date  . BACK SURGERY    . TRANSURETHRAL RESECTION OF BLADDER TUMOR N/A 08/25/2020   Procedure: TRANSURETHRAL RESECTION OF BLADDER TUMOR (TURBT);  Surgeon: Cleon Gustin, MD;  Location: [facility];  Service: Urology;  Laterality: N/A;       Family History  Problem Relation Age of Onset  . Heart attack Mother   . Breast cancer Mother        Mastectomy  . Leukemia Father     Social History   Tobacco Use  . Smoking status: Former Smoker    Packs/day: 1.00    Years: 20.00    Pack years: 20.00    Quit date: 11/13/1998    Years since quitting: 21.8  . Smokeless tobacco: Never Used  Substance Use Topics  . Alcohol use: No  . Drug use: Never    Home Medications Prior to Admission medications   Medication Sig Start Date End Date Taking? Authorizing Provider  Acetaminophen-Pamabrom (CRAMP TABS PO) Take 2-3 tablets by mouth at bedtime as needed (cramps).   Yes [provider]  finasteride (PROSCAR) 5 MG tablet Take 5 mg by mouth daily.  07/02/20  Yes [provider]  naproxen sodium (ALEVE) 220 MG tablet Take 440 mg by mouth daily as needed (pain).    Yes [provider]  oxyCODONE-acetaminophen (PERCOCET) 5-325 MG tablet Take 1 tablet by mouth every 4 (four) hours as needed for severe pain. 08/31/20 08/31/21 Yes McKenzie, Candee Furbish, MD  sildenafil (VIAGRA) 100 MG tablet Take one half to one tablet by mouth every day as needed for ED. 06/14/20  Yes Luking, Elayne Snare, MD  verapamil (CALAN-SR) 240 MG CR tablet TAKE ONE (1) TABLET (240 MG TOTAL) BY MOUTH AT BEDTIME. Patient taking differently: Take 240 mg by mouth at bedtime.  02/10/20  Yes Mikey Kirschner, MD  vitamin B-12 (CYANOCOBALAMIN) 1000 MCG tablet Take 1,000 mcg by mouth daily.   Yes [provider]  alfuzosin (UROXATRAL) 10 MG 24 hr  tablet Take 1 tablet (10 mg total) by mouth daily with breakfast. Patient not taking: Reported on 09/07/2020 08/13/20   Cleon Gustin, MD  ondansetron (ZOFRAN ODT) 4 MG disintegrating tablet Take 1 tablet (4 mg total) by mouth every 8 (eight) hours as needed for nausea or vomiting. 08/26/20   McKenzie, Candee Furbish, MD  oxyCODONE-acetaminophen (PERCOCET/ROXICET) 5-325 MG tablet TAKE ONE (1) TABLET BY MOUTH EVERY FOUR (4) (FOUR) HOURS AS NEEDED FOR SEVERE PAIN. 08/27/20   McKenzie, Candee Furbish, MD  tamsulosin (FLOMAX) 0.4 MG CAPS capsule Take 1 capsule (0.4 mg total) by mouth at bedtime. Patient not taking: Reported on 09/07/2020 04/08/20   Mikey Kirschner, MD    Allergies    Celebrex [celecoxib]  Review of Systems   Review of Systems  Constitutional: Negative for chills and fever.  Respiratory: Negative for cough and shortness of breath.   Cardiovascular: Negative for chest pain and palpitations.  Gastrointestinal: Positive for abdominal pain. Negative for vomiting.  Genitourinary: Positive for difficulty urinating and hematuria.  Skin: Negative for color change and rash.  Neurological: Negative for syncope and light-headedness.  Psychiatric/Behavioral: Negative for agitation and confusion.  All other systems reviewed and are negative.   Physical Exam Updated Vital Signs BP (!) 192/90 (BP Location: Left Arm)   Pulse 78   Temp 98 F (36.7 C) (Oral)   Resp 18   SpO2 99%   Physical Exam Vitals and nursing note reviewed.  Constitutional:      Appearance: He is well-developed.     Comments: Appears uncomfortable  HENT:     Head: Normocephalic and atraumatic.  Eyes:     Conjunctiva/sclera: Conjunctivae normal.  Cardiovascular:     Rate and Rhythm: Normal rate and regular rhythm.     Pulses: Normal pulses.  Pulmonary:     Effort: Pulmonary effort is normal. No respiratory distress.  Abdominal:     Palpations: Abdomen is soft.     Tenderness: There is abdominal tenderness in  the suprapubic area.  Musculoskeletal:     Cervical back: Neck supple.  Skin:    General: Skin is warm and dry.  Neurological:     General: No focal deficit present.     Mental Status: He is alert and oriented to person, place, and time.  Psychiatric:        Mood and Affect: Mood normal.        Behavior: Behavior normal.     ED Results / Procedures / Treatments   Labs (all labs ordered are listed, but only abnormal results are displayed) Labs Reviewed  URINALYSIS, ROUTINE W REFLEX MICROSCOPIC - Abnormal; Notable for the following components:      Result Value   Color, Urine AMBER (*)    APPearance CLOUDY (*)    Hgb urine dipstick MODERATE (*)  Protein, ur 100 (*)    Leukocytes,Ua TRACE (*)    RBC / HPF >50 (*)    WBC, UA >50 (*)    All other components within normal limits  URINE CULTURE    EKG None  Radiology No results found.  Procedures Procedures (including critical care time)  Medications Ordered in ED Medications - No data to display  ED Course  I have reviewed the triage vital signs and the nursing notes.  Pertinent labs & imaging results that were available during my care of the patient were reviewed by me and considered in my medical decision making (see chart for details).  This is a 76-year male w/ prostate and bladder cancer presenting to the emergency department with difficulty urinating.  He has a history of a bladder neck tumor which was resected about a month ago in the operating room.  He is also passing large blood clots.  I suspect he may have an obstruction from a blood clot.  I have asked the nurse to place a Foley catheter and begin bladder irrigation.  He does not appear to have sepsis on arrival. He is not on blood thinners currently.  Urology consulted by phone - see below I personally reviewed the patient's medical records including operative report from last month  Clinical Course as of Sep 07 1656  Tue Sep 07, 2020  [place] Foley  successfully placed by nursing   [MT]  0911 Bladder irritated until urine clear, no large blood clots passed.  Urology paged regarding follow up planning   [MT]  0929 I spoke to dr eskridge who advised we leave in the foley catheter, CT is not needed emergently   [MT]  0955 Pt updated, okay for discharge.   [MT]    Clinical Course User Index [MT] Lovene Maret, Carola Rhine, MD   Final Clinical Impression(s) / ED Diagnoses Final diagnoses:  Urinary retention    Rx / DC Orders ED Discharge Orders    None       Wyvonnia Dusky, MD 09/07/20 [phone]

## 2020-09-09 LAB — URINE CULTURE: Culture: NO GROWTH

## 2020-09-09 NOTE — Progress Notes (Signed)
Sent via mail 

## 2020-09-16 ENCOUNTER — Telehealth: Payer: Self-pay

## 2020-09-16 NOTE — Telephone Encounter (Signed)
Pt called wanting to verify two appt he had scheduled for a bone scan and to ask about rescheduling a CT. Verified appts and gave him number to [employer] [facility].

## 2020-09-17 ENCOUNTER — Other Ambulatory Visit: Payer: Self-pay | Admitting: Urology

## 2020-09-17 ENCOUNTER — Telehealth: Payer: Self-pay

## 2020-09-17 MED ORDER — OXYCODONE-ACETAMINOPHEN 5-325 MG PO TABS
1.0000 | ORAL_TABLET | ORAL | 0 refills | Status: DC | PRN
Start: 2020-09-17 — End: 2020-09-30

## 2020-09-17 MED ORDER — OXYCODONE-ACETAMINOPHEN 5-325 MG PO TABS
ORAL_TABLET | ORAL | 0 refills | Status: DC
Start: 2020-09-17 — End: 2020-09-24

## 2020-09-21 ENCOUNTER — Other Ambulatory Visit: Payer: Self-pay

## 2020-09-21 ENCOUNTER — Encounter (HOSPITAL_COMMUNITY)
Admission: RE | Admit: 2020-09-21 | Discharge: 2020-09-21 | Disposition: A | Discharge status: Home / Self Care | Payer: Medicare Other | Source: Ambulatory Visit | Attending: Urology | Admitting: Urology

## 2020-09-21 DIAGNOSIS — C61 Malignant neoplasm of prostate: Secondary | ICD-10-CM | POA: Diagnosis not present

## 2020-09-21 MED ORDER — TECHNETIUM TC 99M MEDRONATE IV KIT
20.0000 | PACK | Freq: Once | INTRAVENOUS | Status: AC | PRN
  Administered 2020-09-21: 21.3 via INTRAVENOUS

## 2020-09-22 ENCOUNTER — Ambulatory Visit (HOSPITAL_COMMUNITY)
Admission: RE | Admit: 2020-09-22 | Discharge: 2020-09-22 | Disposition: A | Discharge status: Home / Self Care | Payer: Medicare Other | Source: Ambulatory Visit | Attending: Urology | Admitting: Urology

## 2020-09-22 DIAGNOSIS — Z8551 Personal history of malignant neoplasm of bladder: Secondary | ICD-10-CM | POA: Diagnosis not present

## 2020-09-22 DIAGNOSIS — C61 Malignant neoplasm of prostate: Secondary | ICD-10-CM | POA: Insufficient documentation

## 2020-09-22 DIAGNOSIS — N400 Enlarged prostate without lower urinary tract symptoms: Secondary | ICD-10-CM | POA: Diagnosis not present

## 2020-09-22 DIAGNOSIS — K8689 Other specified diseases of pancreas: Secondary | ICD-10-CM | POA: Diagnosis not present

## 2020-09-22 MED ORDER — IOHEXOL 300 MG/ML  SOLN
100.0000 mL | Freq: Once | INTRAMUSCULAR | Status: AC | PRN
  Administered 2020-09-22: 100 mL via INTRAVENOUS

## 2020-09-23 ENCOUNTER — Encounter: Payer: Self-pay | Admitting: Radiation Oncology

## 2020-09-23 NOTE — Progress Notes (Signed)
GU Location of Tumor / Histology: bladder ca vs prostate ca  If Prostate Cancer, Gleason Score is (4 + 5) and PSA is (2.7)  Tony Powell presented to Dr. Alyson Ingles in the beginning of October 2021 for further evaluation of BPH with weak urine stream  Resected bladder tumor (if applicable) revealed:  FINAL MICROSCOPIC DIAGNOSIS:   A. 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   Comment:  The diagnosis of carcinoma is supported by the neoplasia stains positive  for prostein, negative for GATA3, p63 and high molecular cytokeratin.  The morphology and immunostains pattern supports these cells are  prostate origin.   Past/Anticipated interventions by urology, if any: resection of bladder tumor, foley removal , foley placement, prescribed finasteride and flomax, referral to Dr. Tammi Klippel, CT abd/pelvis, bone scan  Past/Anticipated interventions by medical oncology, if any: no  Weight changes, if any: denies  Bowel/Bladder complaints, if any: Foley catheter in place. Reports a burning sensation when urine passes. Reports passing clots of blood in urine. Reports some leakage around urinary catheter. Denies any bowel complaints.  Nausea/Vomiting, if any: denies  Pain issues, if any:  Reports perineal pain associated with catheter and dysuria 8 on a scale of 0-10.  SAFETY ISSUES:  Prior radiation? denies  Pacemaker/ICD? denies  Possible current pregnancy? no, male patient  Is the patient on methotrexate? denies  Current Complaints / other details:  69 year old male. Mother with hx of breast ca and father leukemia. Patient reports all of his brothers died with some form of cancer.

## 2020-09-24 ENCOUNTER — Ambulatory Visit
Admission: RE | Admit: 2020-09-24 | Discharge: 2020-09-24 | Disposition: A | Discharge status: Home / Self Care | Payer: Medicare Other | Source: Ambulatory Visit | Attending: Radiation Oncology | Admitting: Radiation Oncology

## 2020-09-24 ENCOUNTER — Encounter: Payer: Self-pay | Admitting: Radiation Oncology

## 2020-09-24 ENCOUNTER — Other Ambulatory Visit: Payer: Self-pay

## 2020-09-24 VITALS — Ht 70.0 in | Wt 240.0 lb

## 2020-09-24 DIAGNOSIS — C61 Malignant neoplasm of prostate: Secondary | ICD-10-CM

## 2020-09-24 DIAGNOSIS — Z809 Family history of malignant neoplasm, unspecified: Secondary | ICD-10-CM | POA: Diagnosis not present

## 2020-09-24 DIAGNOSIS — R972 Elevated prostate specific antigen [PSA]: Secondary | ICD-10-CM | POA: Diagnosis not present

## 2020-09-24 DIAGNOSIS — Z79899 Other long term (current) drug therapy: Secondary | ICD-10-CM | POA: Diagnosis not present

## 2020-09-24 HISTORY — DX: Malignant neoplasm of prostate: C61

## 2020-09-24 NOTE — Progress Notes (Signed)
Patient has urinary catheter in place. Reports the catheter drains urine with bright red clots. Reports some leakage around the catheter.

## 2020-09-24 NOTE — Progress Notes (Signed)
Radiation Oncology         [phone] ________________________________  Initial Outpatient Consultation - Conducted via Telephone due to current COVID-19 concerns for limiting patient exposure  Name: ELIAH MARQUARD MRN: [id number]  Date: 09/24/2020  DOB: 1951-06-27  WS:FKCLEX, Malena M, DO  McKenzie, Candee Furbish, MD   REFERRING PHYSICIAN: Cleon Gustin, MD  DIAGNOSIS: 69 y.o. 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    ICD-10-CM   1. Prostate cancer [facility])  C61     HISTORY OF PRESENT ILLNESS: HERSON PRICHARD is a 69 y.o. male with a diagnosis of prostate cancer. He was initially referred to Dr. Milford Cage in 05/2020 with increasing bothersome LUTS, mainly intractable urinary frequency and urgency as well as hesitancy and weak stream. Flomax and rapaflo failed to improve his symptoms so he underwent in office cystoscopy at the time of follow up on 07/02/2020 showing trilobar prostatic hypertrophy with significant intravesical component.  The prostate was very loculated with several lobules intravesically, appearing to be contiguous at the bladder neck with retroflexion of the scope.  He was prescribed finasteride for treatment of BPH at that time.  He continued with persistent LUTS and transferred his care to the urology clinic in [place], in consultation with Dr. Alyson Ingles on 08/13/2020 and was found to have a UTI. He was prescribed alfuzosin and Bactrim DS, which he began on 08/17/2020. He subsequently developed gross hematuria with associated dysuria and pelvic pain. His antibiotic was changed to Doxycycline, based on culture results and he was given a Rocephin injection in the office with Dr. Alyson Ingles following placement of a foley catheter on 08/19/2020.  The catheter had to be irrigated the following day due to a clot and he then presented to the ED on 08/21/2020 again with clot retention. He  returned to Dr. Alyson Ingles on 08/23/2020, and cystoscopy performed at that time revealing a 2 cm left lateral wall/trigone tumor, as well as 1 cm intra-vesicle prostatic protrusion on retroflexion.  He proceeded to transurethral resection of the tumor on 08/25/2020.  Final surgical pathology revealed prostatic adenocarcinoma, Gleason 4+5 involving 90% of resected tissue.  Of note, he also had his PSA drawn at follow up on 09/01/2020, which was 2.7 (5.4 adjusted for finasteride). His catheter was removed that day, but he developed urinary retention on 09/07/2020, prompting an ED visit where another foley catheter was placed at that time.  For disease staging, he underwent bone scan on 09/21/2020 showing no findings indicative of bony metastatic disease. He also had a CT A/P on 09/22/2020 showing circumferential, mass-like thickening of bladder wall that appeared contiguous with the prostate gland, concerning for primary urothelial neoplasm with prostate involvement. The serosal surface of the bladder also appeared irregular and nodular with surrounding soft tissue stranding concerning for subserosal tumor extension. There was bilateral pelvic adenopathy with a 1.8 cm left external iliac node, 0.9 cm right external iliac node and 1 cm pericystic nodes.  Also noted were small nonspecific subpleural pulmonary nodules within the right middle lobe and left lower lobe and two focal areas of main duct dilatation within the head of the pancreas without any distinct mass noted.  The patient reviewed the pathology results with his urologist and he has kindly been referred today for discussion of potential radiation treatment options.   PREVIOUS RADIATION THERAPY: No  PAST MEDICAL HISTORY:  Past Medical History:  Diagnosis Date  .  Abnormal stress test   . Borderline hypertension   . Chest discomfort    Atypical  . DJD (degenerative joint disease)    Knees, lumbar sacral spine  . Hyperlipidemia   .  Hypertension   . Prostate cancer ([place])       PAST SURGICAL HISTORY: Past Surgical History:  Procedure Laterality Date  . BACK SURGERY    . TRANSURETHRAL RESECTION OF BLADDER TUMOR N/A 08/25/2020   Procedure: TRANSURETHRAL RESECTION OF BLADDER TUMOR (TURBT);  Surgeon: Cleon Gustin, MD;  Location: [facility];  Service: Urology;  Laterality: N/A;    FAMILY HISTORY:  Family History  Problem Relation Age of Onset  . Heart attack Mother   . Breast cancer Mother        Mastectomy  . Leukemia Father   . Cancer Brother   . Cancer Brother   . Cancer Brother   . Cancer Brother   . Cancer Brother   . Colon cancer Neg Hx   . Prostate cancer Neg Hx   . Pancreatic cancer Neg Hx     SOCIAL HISTORY:  Social History   Socioeconomic History  . Marital status: Divorced    Spouse name: Not on file  . Number of children: 2  . Years of education: Not on file  . Highest education level: Not on file  Occupational History  . Occupation: Long Production manager: [employer]  Tobacco Use  . Smoking status: Former Smoker    Packs/day: 1.00    Years: 20.00    Pack years: 20.00    Quit date: 11/13/1998    Years since quitting: 21.8  . Smokeless tobacco: Never Used  Substance and Sexual Activity  . Alcohol use: No  . Drug use: Never  . Sexual activity: Not Currently  Other Topics Concern  . Not on file  Social History Narrative   Divorced   Social Determinants of Health   Financial Resource Strain:   . Difficulty of Paying Living Expenses: Not on file  Food Insecurity:   . Worried About Charity fundraiser in the Last Year: Not on file  . Ran Out of Food in the Last Year: Not on file  Transportation Needs:   . Lack of Transportation (Medical): Not on file  . Lack of Transportation (Non-Medical): Not on file  Physical Activity:   . Days of Exercise per Week: Not on file  . Minutes of Exercise per Session: Not on file  Stress:   . Feeling of Stress : Not on  file  Social Connections:   . Frequency of Communication with Friends and Family: Not on file  . Frequency of Social Gatherings with Friends and Family: Not on file  . Attends Religious Services: Not on file  . Active Member of Clubs or Organizations: Not on file  . Attends Archivist Meetings: Not on file  . Marital Status: Not on file  Intimate Partner Violence:   . Fear of Current or Ex-Partner: Not on file  . Emotionally Abused: Not on file  . Physically Abused: Not on file  . Sexually Abused: Not on file    ALLERGIES: Celebrex [celecoxib]  MEDICATIONS:  Current Outpatient Medications  Medication Sig Dispense Refill  . finasteride (PROSCAR) 5 MG tablet Take 5 mg by mouth daily.     . naproxen sodium (ALEVE) 220 MG tablet Take 440 mg by mouth daily as needed (pain).     Marland Kitchen oxyCODONE-acetaminophen (PERCOCET) 5-325 MG  tablet Take 1 tablet by mouth every 4 (four) hours as needed for severe pain. 30 tablet 0  . verapamil (CALAN-SR) 240 MG CR tablet TAKE ONE (1) TABLET (240 MG TOTAL) BY MOUTH AT BEDTIME. (Patient taking differently: Take 240 mg by mouth at bedtime. ) 90 tablet 1  . ondansetron (ZOFRAN ODT) 4 MG disintegrating tablet Take 1 tablet (4 mg total) by mouth every 8 (eight) hours as needed for nausea or vomiting. (Patient not taking: Reported on 09/24/2020) 30 tablet 0  . sildenafil (VIAGRA) 100 MG tablet Take one half to one tablet by mouth every day as needed for ED. (Patient not taking: Reported on 09/24/2020) 20 tablet 2   No current facility-administered medications for this encounter.    REVIEW OF SYSTEMS:  On review of systems, the patient reports that he is doing well overall. He denies any chest pain, shortness of breath, cough, fevers, chills, night sweats, unintended weight changes. He denies any bowel disturbances, and denies abdominal pain, nausea or vomiting. He denies any new musculoskeletal or joint aches or pains. He has a catheter in place, and he  reports burning at the tip of the penis, hematuria, and some leakage around the catheter. He also reports perineal pain is an 8 out of 10. He reports that this affects his bowel movements, and he needs a laxative to make bowel movements easier. A complete review of systems is obtained and is otherwise negative.    PHYSICAL EXAM:  Wt Readings from Last 3 Encounters:  09/24/20 240 lb (108.9 kg)  08/24/20 249 lb (112.9 kg)  08/21/20 260 lb 2.3 oz (118 kg)   Temp Readings from Last 3 Encounters:  09/07/20 98 F (36.7 C) (Oral)  09/01/20 98.1 F (36.7 C)  08/26/20 98.6 F (37 C) (Oral)   BP Readings from Last 3 Encounters:  09/07/20 (!) 192/90  09/01/20 (!) 194/93  08/26/20 (!) 144/79   Pulse Readings from Last 3 Encounters:  09/07/20 78  09/01/20 70  08/26/20 77   Pain Assessment Pain Score: 8  Pain Frequency: Intermittent Pain Loc: Groin/10  Physical exam not performed in light of telephone consult visit format.   KPS = 80  100 - Normal; no complaints; no evidence of disease. 90   - Able to carry on normal activity; minor signs or symptoms of disease. 80   - Normal activity with effort; some signs or symptoms of disease. 44   - Cares for self; unable to carry on normal activity or to do active work. 60   - Requires occasional assistance, but is able to care for most of his personal needs. 50   - Requires considerable assistance and frequent medical care. 72   - Disabled; requires special care and assistance. 19   - Severely disabled; hospital admission is indicated although death not imminent. 54   - Very sick; hospital admission necessary; active supportive treatment necessary. 10   - Moribund; fatal processes progressing rapidly. 0     - Dead  Karnofsky DA, Abelmann [place], Craver LS and Burchenal [facility] [phone]) The use of the nitrogen mustards in the palliative treatment of carcinoma: with particular reference to bronchogenic carcinoma Cancer 1 [id number]  LABORATORY DATA:  Lab  Results  Component Value Date   WBC 11.7 (H) 08/26/2020   HGB 9.5 (L) 08/26/2020   HCT 30.2 (L) 08/26/2020   MCV 85.6 08/26/2020   PLT 310 08/26/2020   Lab Results  Component Value Date   NA 145 (H)  09/01/2020   K 4.3 09/01/2020   CL 100 09/01/2020   CO2 26 09/01/2020   Lab Results  Component Value Date   ALT 20 12/18/2018   AST 26 12/18/2018   ALKPHOS 64 12/18/2018   BILITOT 0.7 12/18/2018     RADIOGRAPHY: CT Abdomen Pelvis W Wo Contrast  Result Date: 09/23/2020 CLINICAL DATA:  Prostate cancer. Staging. History of bladder cancer, status post trans urethral resection. EXAM: CT ABDOMEN AND PELVIS WITHOUT AND WITH CONTRAST TECHNIQUE: Multidetector CT imaging of the abdomen and pelvis was performed following the standard protocol before and following the bolus administration of intravenous contrast. CONTRAST:  173mL OMNIPAQUE IOHEXOL 300 MG/ML  SOLN COMPARISON:  None. FINDINGS: Lower chest: No acute abnormality. Mild changes of emphysema. Subpleural left lower lobe nodule along the oblique fissure measures 4 mm, image 38/9. Subpleural nodule along the major fissure of the right lung measures 4 mm, image 16/9. Hepatobiliary: Calcified granuloma noted in the posterior right hepatic lobe. No suspicious liver abnormality. Gallbladder appears within normal limits. No signs of gallbladder wall inflammation or bile duct dilatation. Pancreas: No inflammation or mass identified. 2 focal areas of main duct dilatation identified within the head of pancreas where the duct measures up to 4.6 mm, image 39/4 and image 44/4. Spleen: Normal in size without focal abnormality. Adrenals/Urinary Tract: Normal appearance of the adrenal glands. 3 mm stone within the upper pole of left kidney noted, image 44/3. No right renal calculi. No kidney mass or hydronephrosis. No suspicious filling defects identified within the collecting systems bilaterally. No focal filling defects within the ureters. Urinary bladder is  collapsed around a Foley catheter. Circumferential, asymmetric masslike thickening of the urinary bladder appears contiguous with the prostate gland. This measures 6.8 x 5.9 by 7.1 cm, image 89/4 and image 84/13. Serosal surface of the bladder appears irregular and nodular with surrounding soft tissue stranding. Stomach/Bowel: Stomach is within normal limits. No evidence of bowel wall thickening, distention, or inflammatory changes. Vascular/Lymphatic: Aortic atherosclerosis. No aneurysm. Pelvic adenopathy is identified bilaterally. Index left external iliac lymph node measures 1.8 cm, image 85/4. Right external iliac lymph node is prominent measuring 0.9 cm, image 79/4. Bilateral pericystic adenopathy noted including 1 cm left pericystic lymph node, image 83/4. Reproductive: Enlargement of the prostate gland is contiguous with bladder mass. Other: No free fluid or fluid collections. Small fat containing umbilical hernia. Musculoskeletal: Degenerative disc disease identified at L5-S1. IMPRESSION: 1. Circumferential, masslike thickening of the bladder wall appears contiguous with the prostate gland and is concerning for primary urothelial neoplasm with prostate gland involvement. Serosal surface of the bladder appears irregular and nodular with surrounding soft tissue stranding concerning for subserosal tumor extension. 2. Bilateral pelvic adenopathy compatible with metastatic adenopathy. 3. Small nonspecific subpleural pulmonary nodules within the right middle lobe and left lower lobe. These are nonspecific but warrant attention on follow-up imaging. 4. Emphysema and aortic atherosclerosis. 5. Two focal areas of main duct dilatation identified within the head of pancreas. No distinct mass noted. Consider follow-up imaging with nonemergent, pancreas protocol MRI without and with contrast material. Aortic Atherosclerosis (ICD10-I70.0) and Emphysema (ICD10-J43.9). Electronically Signed   By: Kerby Moors M.D.   On:  09/23/2020 09:48   NM Bone Scan Whole Body  Result Date: 09/22/2020 CLINICAL DATA:  Prostate carcinoma EXAM: NUCLEAR MEDICINE WHOLE BODY BONE SCAN TECHNIQUE: Whole body anterior and posterior images were obtained approximately 3 hours after intravenous injection of radiopharmaceutical. RADIOPHARMACEUTICALS:  21.3 mCi Technetium-36m MDP IV COMPARISON:  None. FINDINGS:  Increased uptake in the wrists, elbows, shoulders, knees, and ankles likely represents arthropathic change. Elsewhere, the distribution of bony radiotracer uptake is unremarkable. There are no findings felt to be indicative of bony metastatic disease. Kidneys are noted in the flank positions bilaterally. A catheter is noted within the urinary bladder. IMPRESSION: No findings indicative of bony metastatic disease. Increased uptake in multiple joints is likely of arthropathic etiology. Catheter within urinary bladder noted. Electronically Signed   By: Lowella Grip III M.D.   On: 09/22/2020 08:45      IMPRESSION/PLAN: This visit was conducted via Telephone to spare the patient unnecessary potential exposure in the healthcare setting during the current COVID-19 pandemic. 1. 69 y.o. gentleman with Stage IVa, locally advanced adenocarcinoma of the prostate with Gleason Score of 4+5, and PSA of 5.4 (adjusted for finasteride). We discussed the patient's workup and outlined the nature of prostate cancer in this setting. The patient's T stage, Gleason's score, and PSA put him into the high risk group. Accordingly, he is eligible for a variety of potential treatment options including prostatectomy or LT-ADT in combination with either 8 weeks of external radiation or 5 weeks of external radiation proceeded by a brachytherapy boost procedure . We discussed the available radiation techniques, and focused on the details and logistics of delivery. The patient is not eligible for brachytherapy boost given his bladder involvement and urinary issues. We  discussed and outlined the risks, benefits, short and long-term effects associated with external beam radiotherapy and compared and contrasted these with prostatectomy.  The recommendation is to proceed with an 8-week course of daily radiotherapy to the prostate, bladder and pelvic lymph nodes, concurrent with ADT.  We discussed the role of ADT in the treatment of high risk prostate cancer and outlined the associated side effects that could be expected with this therapy. He was encouraged to ask questions that were answered to his stated satisfaction.  At the end of the conversation, the patient is interested in moving forward with the recommended 8 week course of external beam therapy in combination with ADT. We will share our discussion with Dr. Alyson Ingles and make arrangements for a follow-up visit to start ADT, first available.  He has a scheduled follow-up visit with Dr. Alyson Ingles on 09/30/2020 and is hopeful that he can receive the ADT at that visit.  The patient lives in [place], [place] and prefers to have the daily treatments closer to home so we will refer the patient to Dr. Rhunette Croft in radiation oncology in [place], [place] to discuss therapy, in anticipation of beginning the daily radiation treatments approximately 8 weeks from the start of ADT. The patient appears to have a good understanding of his disease and our treatment recommendations which are of curative intent and is in agreement with the stated plan.  Therefore, we will move forward with referrals accordingly.  Given current concerns for patient exposure during the COVID-19 pandemic, this encounter was conducted via telephone. The patient was notified in advance and was offered a MyChart meeting to allow for face to face communication but unfortunately reported that he did not have the appropriate resources/technology to support such a visit and instead preferred to proceed with telephone consult. The patient has given verbal consent for this type  of encounter. The time spent during this encounter was 60 minutes. The attendants for this meeting include Tyler Pita MD, Ashlyn Bruning PA-C, [place], and patient, RENELL ALLUM. During the encounter, Tyler Pita MD, Ashlyn Bruning PA-C, and scribe,  Wilburn Mylar were located at [facility] Radiation Oncology Department.  Patient, VERLYN LAMBERT was located at home.   Nicholos Johns, PA-C    Tyler Pita, MD  [facility] Direct Dial: [phone]  Fax: [phone] [email or web address]  Skype  [email or web address]  This document serves as a record of services personally performed by Tyler Pita, MD and Freeman Caldron, PA-C. It was created on their behalf by Wilburn Mylar, a trained medical scribe. The creation of this record is based on the scribe's personal observations and the provider's statements to them. This document has been checked and approved by the attending provider.

## 2020-09-27 ENCOUNTER — Telehealth: Payer: Self-pay | Admitting: *Deleted

## 2020-09-27 NOTE — Telephone Encounter (Signed)
CALLED PATIENT TO INFORM OF [facility] APPT. WITH DR. Rhunette Croft IN [place], [place] ON 10-25-20 - ARRIVAL TIME- 8 AM, ADDRESS- [address], [place], [place]. AND PHONE NUMBER - [phone], SPOKE WITH PATIENT AND HE IS AWARE OF THIS APPT.

## 2020-09-27 NOTE — Telephone Encounter (Signed)
Med was sent

## 2020-09-30 ENCOUNTER — Other Ambulatory Visit: Payer: Self-pay

## 2020-09-30 ENCOUNTER — Encounter: Payer: Self-pay | Admitting: Urology

## 2020-09-30 ENCOUNTER — Ambulatory Visit (INDEPENDENT_AMBULATORY_CARE_PROVIDER_SITE_OTHER): Payer: Medicare Other | Admitting: Urology

## 2020-09-30 VITALS — BP 184/85 | HR 93 | Temp 98.5°F

## 2020-09-30 DIAGNOSIS — R310 Gross hematuria: Secondary | ICD-10-CM | POA: Diagnosis not present

## 2020-09-30 DIAGNOSIS — C61 Malignant neoplasm of prostate: Secondary | ICD-10-CM | POA: Diagnosis not present

## 2020-09-30 MED ORDER — OXYCODONE-ACETAMINOPHEN 5-325 MG PO TABS: 1.0000 | ORAL_TABLET | ORAL | 0 refills | Status: DC | PRN

## 2020-09-30 MED ORDER — DEGARELIX ACETATE(240 MG DOSE) 120 MG/VIAL ~~LOC~~ SOLR
240.0000 mg | Freq: Once | SUBCUTANEOUS | Status: AC
  Administered 2020-09-30: 240 mg via SUBCUTANEOUS

## 2020-09-30 NOTE — Progress Notes (Signed)
Urological Symptom Review  Patient is experiencing the following symptoms: Frequent urination Burning/pain with urination Trouble starting stream Have to strain to urinate Blood in urine   Review of Systems  Gastrointestinal (upper)  : Negative for upper GI symptoms  Gastrointestinal (lower) : Negative for lower GI symptoms  Constitutional : Night Sweats  Skin: Negative for skin symptoms  Eyes: Negative for eye symptoms  Ear/Nose/Throat : Negative for Ear/Nose/Throat symptoms  Hematologic/Lymphatic: Negative for Hematologic/Lymphatic symptoms  Cardiovascular : Negative for cardiovascular symptoms  Respiratory : Negative for respiratory symptoms  Endocrine: Negative for endocrine symptoms  Musculoskeletal: Negative for musculoskeletal symptoms  Neurological: Negative for neurological symptoms  Psychologic: Negative for psychiatric symptoms

## 2020-09-30 NOTE — Progress Notes (Signed)
Cath Change/ Replacement  Patient is present today for a catheter change due to urinary retention.  32ml of water was removed from the balloon, a 16FR foley cath was removed with out difficulty.  Patient was cleaned and prepped in a sterile fashion with betadine. A 16  coude FR foley cath was replaced into the bladder no complications were noted Urine return was noted 54ml and urine was yellow in color. The balloon was filled with 6ml of sterile water. A leg bag was attached for drainage.  A night bag was also given to the patient and patient was given instruction on how to change from one bag to another. Patient was given proper instruction on catheter care.    Performed by: Park Pope RN  Follow up: return 1 month

## 2020-09-30 NOTE — Progress Notes (Signed)
09/30/2020 10:20 AM   Tony Powell [birth year] [id number]  Referring provider: Erven Colla, DO [address] [place],  [place] [address]  Prostate cancer and gross hematuria  HPI: Tony Powell is a 70yo here for folllowup for prostate cancer and gross hematuria. He has met with Dr. Tammi Klippel and has elected to proceed with IMRT. He has also elected to proceed with ADT. NO recent gross hematuria. He has an indwelling foley   PMH: Past Medical History:  Diagnosis Date  . Abnormal stress test   . Borderline hypertension   . Chest discomfort    Atypical  . DJD (degenerative joint disease)    Knees, lumbar sacral spine  . Hyperlipidemia   . Hypertension   . Prostate cancer [facility])     Surgical History: Past Surgical History:  Procedure Laterality Date  . BACK SURGERY    . TRANSURETHRAL RESECTION OF BLADDER TUMOR N/A 08/25/2020   Procedure: TRANSURETHRAL RESECTION OF BLADDER TUMOR (TURBT);  Surgeon: Cleon Gustin, MD;  Location: [facility];  Service: Urology;  Laterality: N/A;    Home Medications:  Allergies as of 09/30/2020      Reactions   Celebrex [celecoxib]    Eyes drooping and messed up skin on hands      Medication List       Accurate as of September 30, 2020 10:20 AM. If you have any questions, ask your nurse or doctor.        STOP taking these medications   sildenafil 100 MG tablet Commonly known as: Viagra Stopped by: [name], MD     TAKE these medications   Aleve 220 MG tablet Generic drug: naproxen sodium Take 440 mg by mouth daily as needed (pain).   finasteride 5 MG tablet Commonly known as: PROSCAR Take 5 mg by mouth daily.   ondansetron 4 MG disintegrating tablet Commonly known as: Zofran ODT Take 1 tablet (4 mg total) by mouth every 8 (eight) hours as needed for nausea or vomiting.   oxyCODONE-acetaminophen 5-325 MG tablet Commonly known as: Percocet Take 1 tablet by mouth every 4 (four) hours as needed for severe pain.     verapamil 240 MG CR tablet Commonly known as: CALAN-SR TAKE ONE (1) TABLET (240 MG TOTAL) BY MOUTH AT BEDTIME. What changed:   how much to take  how to take this  when to take this  additional instructions       Allergies:  Allergies  Allergen Reactions  . Celebrex [Celecoxib]     Eyes drooping and messed up skin on hands    Family History: Family History  Problem Relation Age of Onset  . Heart attack Mother   . Breast cancer Mother        Mastectomy  . Leukemia Father   . Cancer Brother   . Cancer Brother   . Cancer Brother   . Cancer Brother   . Cancer Brother   . Colon cancer Neg Hx   . Prostate cancer Neg Hx   . Pancreatic cancer Neg Hx     Social History:  reports that he quit smoking about 21 years ago. He has a 20.00 pack-year smoking history. He has never used smokeless tobacco. He reports that he does not drink alcohol and does not use drugs.  ROS: All other review of systems were reviewed and are negative except what is noted above in HPI  Physical Exam: BP (!) 184/85   Pulse 93   Temp 98.5 F (36.9  C)   Constitutional:  Alert and oriented, No acute distress. HEENT: [place] AT, moist mucus membranes.  Trachea midline, no masses. Cardiovascular: No clubbing, cyanosis, or edema. Respiratory: Normal respiratory effort, no increased work of breathing. GI: Abdomen is soft, nontender, nondistended, no abdominal masses GU: No CVA tenderness.  Lymph: No cervical or inguinal lymphadenopathy. Skin: No rashes, bruises or suspicious lesions. Neurologic: Grossly intact, no focal deficits, moving all 4 extremities. Psychiatric: Normal mood and affect.  Laboratory Data: Lab Results  Component Value Date   WBC 11.7 (H) 08/26/2020   HGB 9.5 (L) 08/26/2020   HCT 30.2 (L) 08/26/2020   MCV 85.6 08/26/2020   PLT 310 08/26/2020    Lab Results  Component Value Date   CREATININE 1.17 09/01/2020    No results found for: PSA  No results found for:  TESTOSTERONE  No results found for: HGBA1C  Urinalysis    Component Value Date/Time   COLORURINE AMBER (A) 09/07/2020 1043   APPEARANCEUR CLOUDY (A) 09/07/2020 1043   APPEARANCEUR Cloudy (A) 08/13/2020 1042   LABSPEC 1.020 09/07/2020 1043   PHURINE 6.0 09/07/2020 1043   GLUCOSEU NEGATIVE 09/07/2020 1043   HGBUR MODERATE (A) 09/07/2020 1043   BILIRUBINUR NEGATIVE 09/07/2020 1043   BILIRUBINUR Negative 08/13/2020 1042   KETONESUR NEGATIVE 09/07/2020 1043   PROTEINUR 100 (A) 09/07/2020 1043   NITRITE NEGATIVE 09/07/2020 1043   LEUKOCYTESUR TRACE (A) 09/07/2020 1043    Lab Results  Component Value Date   LABMICR See below: 08/13/2020   WBCUA 11-30 (A) 08/13/2020   LABEPIT None seen 08/13/2020   BACTERIA NONE SEEN 09/07/2020    Pertinent Imaging: CT 11/10: Images reviewed and discussed with the patient No results found for this or any previous visit.  No results found for this or any previous visit.  No results found for this or any previous visit.  No results found for this or any previous visit.  No results found for this or any previous visit.  No results found for this or any previous visit.  No results found for this or any previous visit.  No results found for this or any previous visit.   Assessment & Plan:    1. Prostate cancer ([place]) -firmagon 261m today -RTC 1 month for lupron/eligard 450m  2. Gross hematuria -We will continue indwelling foley due to hx of retention and gross hematuria   No follow-ups on file.  PaNicolette BangMD  [facility] [place]

## 2020-09-30 NOTE — Progress Notes (Signed)
Pt returned to office today c/o Catheter leaking. Catheter was flushed without difficulty with urine return noted. After flushing catheter pt reports lower abd pain and urine noted coming out from penis.   I spoke with Dr. Alyson Ingles who stated to give patient 25mg  Myrbetriq samples.  Samples given to pt with instructions on how to take.

## 2020-10-01 ENCOUNTER — Encounter: Payer: Self-pay | Admitting: Medical Oncology

## 2020-10-05 ENCOUNTER — Telehealth: Payer: Self-pay

## 2020-10-11 NOTE — Progress Notes (Signed)
Introduced myself to patient as the prostate nurse navigator.  He consulted with Dr. Tammi Klippel 11/12 and has chosen ADT and radiation. Due to living in [place], he would like to receive radiation txs closer to home. A referral has been place. He did receive his ADT 11/18 with Dr. Alyson Ingles. I asked him to call me with questions or concerns or if he needs help with referral to radiation oncologist in [place].

## 2020-10-13 ENCOUNTER — Other Ambulatory Visit: Payer: Self-pay | Admitting: Urology

## 2020-10-13 ENCOUNTER — Telehealth: Payer: Self-pay

## 2020-10-13 MED ORDER — MEGESTROL ACETATE 20 MG PO TABS: 20.0000 mg | ORAL_TABLET | Freq: Two times a day (BID) | ORAL | 3 refills | Status: DC | PRN

## 2020-10-13 NOTE — Telephone Encounter (Signed)
Pt has been seen 

## 2020-10-13 NOTE — Telephone Encounter (Signed)
Pt asking for something for hot flashes.

## 2020-10-15 ENCOUNTER — Other Ambulatory Visit: Payer: Self-pay | Admitting: Urology

## 2020-10-15 ENCOUNTER — Telehealth: Payer: Self-pay

## 2020-10-15 MED ORDER — OXYCODONE-ACETAMINOPHEN 5-325 MG PO TABS: 1.0000 | ORAL_TABLET | ORAL | 0 refills | Status: DC | PRN

## 2020-10-15 NOTE — Telephone Encounter (Signed)
Pt called asking for pain medication to be refilled to [facility].

## 2020-10-25 DIAGNOSIS — C775 Secondary and unspecified malignant neoplasm of intrapelvic lymph nodes: Secondary | ICD-10-CM | POA: Diagnosis not present

## 2020-10-25 DIAGNOSIS — Z87891 Personal history of nicotine dependence: Secondary | ICD-10-CM | POA: Diagnosis not present

## 2020-10-25 DIAGNOSIS — F419 Anxiety disorder, unspecified: Secondary | ICD-10-CM | POA: Diagnosis not present

## 2020-10-25 DIAGNOSIS — Z960 Presence of urogenital implants: Secondary | ICD-10-CM | POA: Diagnosis not present

## 2020-10-25 DIAGNOSIS — E7800 Pure hypercholesterolemia, unspecified: Secondary | ICD-10-CM | POA: Diagnosis not present

## 2020-10-25 DIAGNOSIS — I10 Essential (primary) hypertension: Secondary | ICD-10-CM | POA: Diagnosis not present

## 2020-10-25 DIAGNOSIS — C7911 Secondary malignant neoplasm of bladder: Secondary | ICD-10-CM | POA: Diagnosis not present

## 2020-10-25 DIAGNOSIS — C61 Malignant neoplasm of prostate: Secondary | ICD-10-CM | POA: Diagnosis not present

## 2020-10-25 DIAGNOSIS — Z79899 Other long term (current) drug therapy: Secondary | ICD-10-CM | POA: Diagnosis not present

## 2020-10-28 ENCOUNTER — Telehealth: Payer: Self-pay

## 2020-10-28 ENCOUNTER — Other Ambulatory Visit: Payer: Self-pay | Admitting: *Deleted

## 2020-10-28 ENCOUNTER — Telehealth: Payer: Self-pay | Admitting: *Deleted

## 2020-10-28 ENCOUNTER — Other Ambulatory Visit: Payer: Self-pay

## 2020-10-28 ENCOUNTER — Encounter: Payer: Self-pay | Admitting: Urology

## 2020-10-28 ENCOUNTER — Ambulatory Visit (INDEPENDENT_AMBULATORY_CARE_PROVIDER_SITE_OTHER): Payer: Medicare Other | Admitting: Urology

## 2020-10-28 VITALS — BP 183/80 | HR 79 | Temp 98.0°F | Ht 70.0 in | Wt 240.0 lb

## 2020-10-28 DIAGNOSIS — C61 Malignant neoplasm of prostate: Secondary | ICD-10-CM

## 2020-10-28 DIAGNOSIS — R39198 Other difficulties with micturition: Secondary | ICD-10-CM

## 2020-10-28 DIAGNOSIS — R310 Gross hematuria: Secondary | ICD-10-CM

## 2020-10-28 MED ORDER — LEUPROLIDE ACETATE (6 MONTH) 45 MG ~~LOC~~ KIT
45.0000 mg | PACK | Freq: Once | SUBCUTANEOUS | Status: AC
  Administered 2020-10-28: 12:00:00 45 mg via SUBCUTANEOUS

## 2020-10-28 MED ORDER — MEGESTROL ACETATE 20 MG PO TABS
20.0000 mg | ORAL_TABLET | Freq: Two times a day (BID) | ORAL | 3 refills | Status: DC | PRN
Start: 2020-10-28 — End: 2020-11-17

## 2020-10-28 MED ORDER — VERAPAMIL HCL ER 240 MG PO TBCR: EXTENDED_RELEASE_TABLET | ORAL | 0 refills | Status: DC

## 2020-10-28 MED ORDER — OXYCODONE-ACETAMINOPHEN 5-325 MG PO TABS: 1.0000 | ORAL_TABLET | ORAL | 0 refills | Status: DC | PRN

## 2020-10-28 NOTE — Progress Notes (Signed)
10/28/2020 11:22 AM   Tony Powell 05/16/1951 [id number]  Referring provider: Erven Colla, DO [address] [place],  [place] [address]  followup prostate cancer and gross hematuria  HPI: Tony Powell is a 69yo here for followup for prostate cancer and gross hematuria. He is getting intense hot flashes at night. The megace decreases the intensity. He is due for ADT today. His gross hematuria resolved 1 week after start firmagon. His foley is due to be changed. Foley is to stay in place until he has finished radiation therapy.   PMH: Past Medical History:  Diagnosis Date  . Abnormal stress test   . Borderline hypertension   . Chest discomfort    Atypical  . DJD (degenerative joint disease)    Knees, lumbar sacral spine  . Hyperlipidemia   . Hypertension   . Prostate cancer [facility])     Surgical History: Past Surgical History:  Procedure Laterality Date  . BACK SURGERY    . TRANSURETHRAL RESECTION OF BLADDER TUMOR N/A 08/25/2020   Procedure: TRANSURETHRAL RESECTION OF BLADDER TUMOR (TURBT);  Surgeon: Cleon Gustin, MD;  Location: [facility];  Service: Urology;  Laterality: N/A;    Home Medications:  Allergies as of 10/28/2020      Reactions   Celebrex [celecoxib]    Eyes drooping and messed up skin on hands      Medication List       Accurate as of October 28, 2020 11:22 AM. If you have any questions, ask your nurse or doctor.        finasteride 5 MG tablet Commonly known as: PROSCAR Take 5 mg by mouth daily.   megestrol 20 MG tablet Commonly known as: MEGACE Take 1 tablet (20 mg total) by mouth 2 (two) times daily as needed.   naproxen sodium 220 MG tablet Commonly known as: ALEVE Take 440 mg by mouth daily as needed (pain).   ondansetron 4 MG disintegrating tablet Commonly known as: Zofran ODT Take 1 tablet (4 mg total) by mouth every 8 (eight) hours as needed for nausea or vomiting.   oxyCODONE-acetaminophen 5-325 MG tablet Commonly known as:  Percocet Take 1 tablet by mouth every 4 (four) hours as needed for moderate pain or severe pain.   verapamil 240 MG CR tablet Commonly known as: CALAN-SR TAKE ONE (1) TABLET (240 MG TOTAL) BY MOUTH AT BEDTIME. What changed:   how much to take  how to take this  when to take this  additional instructions       Allergies:  Allergies  Allergen Reactions  . Celebrex [Celecoxib]     Eyes drooping and messed up skin on hands    Family History: Family History  Problem Relation Age of Onset  . Heart attack Mother   . Breast cancer Mother        Mastectomy  . Leukemia Father   . Cancer Brother   . Cancer Brother   . Cancer Brother   . Cancer Brother   . Cancer Brother   . Colon cancer Neg Hx   . Prostate cancer Neg Hx   . Pancreatic cancer Neg Hx     Social History:  reports that he quit smoking about 21 years ago. He has a 20.00 pack-year smoking history. He has never used smokeless tobacco. He reports that he does not drink alcohol and does not use drugs.  ROS: All other review of systems were reviewed and are negative except what is noted above in  HPI  Physical Exam: BP (!) 183/80   Pulse 79   Temp 98 F (36.7 C)   Ht 5\' 10"  (1.778 m)   Wt 240 lb (108.9 kg)   BMI 34.44 kg/m   Constitutional:  Alert and oriented, No acute distress. HEENT: [place] AT, moist mucus membranes.  Trachea midline, no masses. Cardiovascular: No clubbing, cyanosis, or edema. Respiratory: Normal respiratory effort, no increased work of breathing. GI: Abdomen is soft, nontender, nondistended, no abdominal masses GU: No CVA tenderness.  Lymph: No cervical or inguinal lymphadenopathy. Skin: No rashes, bruises or suspicious lesions. Neurologic: Grossly intact, no focal deficits, moving all 4 extremities. Psychiatric: Normal mood and affect.  Laboratory Data: Lab Results  Component Value Date   WBC 11.7 (H) 08/26/2020   HGB 9.5 (L) 08/26/2020   HCT 30.2 (L) 08/26/2020   MCV 85.6  08/26/2020   PLT 310 08/26/2020    Lab Results  Component Value Date   CREATININE 1.17 09/01/2020    No results found for: PSA  No results found for: TESTOSTERONE  No results found for: HGBA1C  Urinalysis    Component Value Date/Time   COLORURINE AMBER (A) 09/07/2020 1043   APPEARANCEUR CLOUDY (A) 09/07/2020 1043   APPEARANCEUR Cloudy (A) 08/13/2020 1042   LABSPEC 1.020 09/07/2020 1043   PHURINE 6.0 09/07/2020 1043   GLUCOSEU NEGATIVE 09/07/2020 1043   HGBUR MODERATE (A) 09/07/2020 1043   BILIRUBINUR NEGATIVE 09/07/2020 1043   BILIRUBINUR Negative 08/13/2020 1042   KETONESUR NEGATIVE 09/07/2020 1043   PROTEINUR 100 (A) 09/07/2020 1043   NITRITE NEGATIVE 09/07/2020 1043   LEUKOCYTESUR TRACE (A) 09/07/2020 1043    Lab Results  Component Value Date   LABMICR See below: 08/13/2020   WBCUA 11-30 (A) 08/13/2020   LABEPIT None seen 08/13/2020   BACTERIA NONE SEEN 09/07/2020    Pertinent Imaging:  No results found for this or any previous visit.  No results found for this or any previous visit.  No results found for this or any previous visit.  No results found for this or any previous visit.  No results found for this or any previous visit.  No results found for this or any previous visit.  No results found for this or any previous visit.  No results found for this or any previous visit.   Assessment & Plan:    1. Prostate cancer ([place]) -Eligard 45mg  today  2. Gross hematuria -resolved  3. Difficulty voiding -continue monthly foley changes   Return for monthly foley changes.  [name], MD  [facility]

## 2020-10-28 NOTE — Telephone Encounter (Signed)
Was sent on 10/28/20 for 60 tablets.  Pt needs an appt in the next 2-3 wks.  To f/u on his blood pressure.   Thx.   Dr. Lovena Le

## 2020-10-28 NOTE — Patient Instructions (Signed)
Prostate Cancer  The prostate is a male gland that helps make semen. Prostate cancer is when abnormal cells grow in this gland. Follow these instructions at home:  Take over-the-counter and prescription medicines only as told by your doctor.  Eat a healthy diet.  Get plenty of sleep.  Ask your doctor for help to find a support group for men with prostate cancer.  Keep all follow-up visits as told by your doctor. This is important.  If you have to go to the hospital, let your cancer doctor (oncologist) know.  Touch, hold, hug, and caress your partner to continue to show sexual feelings. Contact a doctor if:  You have trouble peeing (urinating).  You have blood in your pee (urine).  You have pain in your hips, back, or chest. Get help right away if:  You have weakness in your legs.  You lose feeling (have numbness) in your legs.  You cannot control your pee or your poop (stool).  You have trouble breathing.  You have sudden pain in your chest.  You have chills or a fever. Summary  The prostate is a male gland that helps make semen. Prostate cancer is when abnormal cells grow in this gland.  Ask your doctor for help to find a support group for men with prostate cancer.  Contact a doctor if you have problems peeing or have any new pain that you did not have before. This information is not intended to replace advice given to you by your health care provider. Make sure you discuss any questions you have with your health care provider. Document Revised: 10/12/2017 Document Reviewed: 07/10/2016 Elsevier Patient Education  2020 Elsevier Inc.  

## 2020-10-28 NOTE — Progress Notes (Signed)
Cath Change/ Replacement  Patient is present today for a catheter change due to urinary retention.  0 ml of water was removed from the balloon, a 16FR coude foley cath was removed with out difficulty.  Patient was cleaned and prepped in a sterile fashion with betadine. A 16 FR coude foley cath was replaced into the bladder. Pt kept changing and adjusting bag and when I went to take water from the balloon there was none. I pulled existing cath out and bright blood came out with it and some discharge. I changed cath then some bright red blood ran into the bag. Pt wanted Dr. Alyson Ingles to look at it and he came in and did. Michela Pitcher it should stop. Urine return was noted 24ml and urine was bright red in color. The balloon was filled with 86ml of sterile water. A leg bag was attached for drainage.  Patient was given proper instruction on catheter care.

## 2020-10-28 NOTE — Telephone Encounter (Signed)
Has appt in January. Pt was notified refill sent in

## 2020-10-28 NOTE — Progress Notes (Signed)

## 2020-10-28 NOTE — Telephone Encounter (Signed)
Pt needs refill on verapamil (CALAN-SR) 240 MG CR tablet Doctor, general practice at [facility], [place] [address] wants at least three refills  Pt call back [phone]

## 2020-10-28 NOTE — Telephone Encounter (Signed)
Pt states he walked in this morning and asked for a refill on verapamil and he is out of med. I saw note in chart that states he needs viist. Last visit in June and it was not a med check. I told pt he needed med check and we could send in enough to get him through to appt. He was very upset and told me he was getting ready to start radiation 5 days a week and does not know when he would be able to squeeze in an office visit with dr Lovena Le. Please advise if he can have refill

## 2020-10-28 NOTE — Telephone Encounter (Signed)
Pt needs appt then may route back to nurses to route to provider. Thank you!

## 2020-10-29 DIAGNOSIS — C7911 Secondary malignant neoplasm of bladder: Secondary | ICD-10-CM | POA: Diagnosis not present

## 2020-10-29 DIAGNOSIS — I10 Essential (primary) hypertension: Secondary | ICD-10-CM | POA: Diagnosis not present

## 2020-10-29 DIAGNOSIS — F419 Anxiety disorder, unspecified: Secondary | ICD-10-CM | POA: Diagnosis not present

## 2020-10-29 DIAGNOSIS — E7800 Pure hypercholesterolemia, unspecified: Secondary | ICD-10-CM | POA: Diagnosis not present

## 2020-10-29 DIAGNOSIS — C775 Secondary and unspecified malignant neoplasm of intrapelvic lymph nodes: Secondary | ICD-10-CM | POA: Diagnosis not present

## 2020-10-29 DIAGNOSIS — C61 Malignant neoplasm of prostate: Secondary | ICD-10-CM | POA: Diagnosis not present

## 2020-11-01 ENCOUNTER — Telehealth: Payer: Self-pay

## 2020-11-01 NOTE — Telephone Encounter (Signed)
Pt called saying he was going to have to come into office today to get cath changed. He said it had been killing him since I had changed it last week. I explained I was sorry but we did not have a doctor on site today or tomorrow so he would have to go to ER if unable to tolerate. Before I could say anything else he said "so you can't change this catheter unless dr is there". I said No Sir. Pt stated "well there wasn't a dr onsite when you changed it the last time" and hung up."

## 2020-11-10 DIAGNOSIS — I10 Essential (primary) hypertension: Secondary | ICD-10-CM | POA: Diagnosis not present

## 2020-11-10 DIAGNOSIS — C7911 Secondary malignant neoplasm of bladder: Secondary | ICD-10-CM | POA: Diagnosis not present

## 2020-11-10 DIAGNOSIS — F419 Anxiety disorder, unspecified: Secondary | ICD-10-CM | POA: Diagnosis not present

## 2020-11-10 DIAGNOSIS — C775 Secondary and unspecified malignant neoplasm of intrapelvic lymph nodes: Secondary | ICD-10-CM | POA: Diagnosis not present

## 2020-11-10 DIAGNOSIS — E7800 Pure hypercholesterolemia, unspecified: Secondary | ICD-10-CM | POA: Diagnosis not present

## 2020-11-10 DIAGNOSIS — C61 Malignant neoplasm of prostate: Secondary | ICD-10-CM | POA: Diagnosis not present

## 2020-11-15 ENCOUNTER — Telehealth: Payer: Self-pay

## 2020-11-17 MED ORDER — MEGESTROL ACETATE 20 MG PO TABS
20.0000 mg | ORAL_TABLET | Freq: Two times a day (BID) | ORAL | 3 refills | Status: DC | PRN
Start: 2020-11-17 — End: 2020-12-16

## 2020-11-17 MED ORDER — OXYCODONE-ACETAMINOPHEN 5-325 MG PO TABS: 1.0000 | ORAL_TABLET | ORAL | 0 refills | Status: DC | PRN

## 2020-11-17 NOTE — Telephone Encounter (Signed)
Pt notified of rxes sent.

## 2020-11-17 NOTE — Telephone Encounter (Signed)
Refill request

## 2020-11-17 NOTE — Telephone Encounter (Signed)
2 rxs sent to pharmacy

## 2020-11-30 ENCOUNTER — Telehealth: Payer: Self-pay | Admitting: Urology

## 2020-11-30 NOTE — Telephone Encounter (Signed)
Jenny Reichmann w/ radiation oncology called and asked for a nurse to call her regarding this pt.

## 2020-11-30 NOTE — Telephone Encounter (Signed)
Returned call, no answer, message left to return call.

## 2020-12-01 NOTE — Telephone Encounter (Signed)
Yes ok to remove foley

## 2020-12-01 NOTE — Telephone Encounter (Signed)
I talked with Jenny Reichmann , radiation nurse, patient is requesting to have catheter removed. I told her we have removed multiple times but he ends back in ER with clot retention.  Pt is scheduled for nurse visit Friday for cath change- okay to remove catheter?

## 2020-12-02 ENCOUNTER — Other Ambulatory Visit: Payer: Self-pay

## 2020-12-02 ENCOUNTER — Ambulatory Visit (INDEPENDENT_AMBULATORY_CARE_PROVIDER_SITE_OTHER): Payer: Medicare Other

## 2020-12-02 DIAGNOSIS — N138 Other obstructive and reflux uropathy: Secondary | ICD-10-CM

## 2020-12-02 DIAGNOSIS — N401 Enlarged prostate with lower urinary tract symptoms: Secondary | ICD-10-CM

## 2020-12-02 NOTE — Progress Notes (Signed)
Cath Change/ Replacement  Patient is present today for a catheter change due to urinary retention.  25ml of water was removed from the balloon, a 16FR coude foley cath was removed without difficulty.  Patient was cleaned and prepped in a sterile fashion with betadine. A 16 FR coude foley cath was replaced into the bladder no complications were noted Urine return was noted 39ml and urine was yellow in color. The balloon was filled with 34ml of sterile water. A leg bag was attached for drainage. Patient was given proper instruction on catheter care.    Performed by: Eldoris Beiser, lpn  Follow up: Keep next scheduled [place]

## 2020-12-02 NOTE — Addendum Note (Signed)
Addended by: Valentina Lucks on: 12/02/2020 02:56 PM   Modules accepted: Orders

## 2020-12-02 NOTE — Patient Instructions (Signed)

## 2020-12-03 ENCOUNTER — Ambulatory Visit: Payer: Medicare Other | Admitting: Family Medicine

## 2020-12-03 ENCOUNTER — Ambulatory Visit: Payer: Medicare Other

## 2020-12-08 ENCOUNTER — Telehealth: Payer: Self-pay

## 2020-12-10 ENCOUNTER — Other Ambulatory Visit: Payer: Self-pay

## 2020-12-10 DIAGNOSIS — R310 Gross hematuria: Secondary | ICD-10-CM

## 2020-12-10 LAB — URINE CULTURE

## 2020-12-10 MED ORDER — CIPROFLOXACIN HCL 250 MG PO TABS: 250.0000 mg | ORAL_TABLET | Freq: Two times a day (BID) | ORAL | 0 refills | Status: DC

## 2020-12-10 NOTE — Progress Notes (Signed)
Urine culture report reviewed with Dr. Alyson Ingles- new order received for Cipro 250 mg BID po for 7 days.  Prescription sent to [facility] per pt request., pt voiced understanding.,

## 2020-12-14 ENCOUNTER — Other Ambulatory Visit: Payer: Self-pay | Admitting: Urology

## 2020-12-22 ENCOUNTER — Telehealth: Payer: Self-pay | Admitting: Family Medicine

## 2020-12-22 NOTE — Telephone Encounter (Signed)
Pt pressures from office notes from urology, showing elevated bp in 180/80s.  Pt needs to be seen for his elevated bp.  When is a good time for him to come in?  We can work around his radiation treatments. We can give some meds till next visit.  But best to come in since the numbers are elevated.   Thx.   Dr. Lovena Le

## 2020-12-22 NOTE — Telephone Encounter (Signed)
Pt calling and wanting to know why he needs to come in for an appointment. Pt states he gets his blood pressure checked at radiation every week. Informed pt that provider needs to check his blood pressure since she is the ordering provider for his medication. Please advise. Thank you

## 2020-12-22 NOTE — Telephone Encounter (Signed)
Ok thanks, Dr. Lovena Le

## 2020-12-22 NOTE — Telephone Encounter (Signed)
Patient is declining appt at this time. Patient states when he gets things better with the cancer and treatments he will come in but his blood pressure is elevated because he is in a lot of pain when he goes to the bathroom and with the treatments. Patient states when he is not hurting his blood pressure is better - today was in 140s over 80s. Patient states he has radiation every week and seeing urology very frequently and sees them next week.

## 2020-12-27 ENCOUNTER — Other Ambulatory Visit: Payer: Self-pay | Admitting: Urology

## 2020-12-27 ENCOUNTER — Telehealth: Payer: Self-pay

## 2020-12-27 MED ORDER — OXYCODONE-ACETAMINOPHEN 5-325 MG PO TABS: 1.0000 | ORAL_TABLET | ORAL | 0 refills | Status: DC | PRN

## 2020-12-27 NOTE — Telephone Encounter (Signed)
Pt called back again about his pain med. Told pt Dr. Alyson Ingles filled it about 20 mins ago. Also wanted samples of Toviaz that were given to him last month. Notified him 1 months worth placed up front for him.

## 2020-12-27 NOTE — Telephone Encounter (Signed)
resolved 

## 2020-12-27 NOTE — Telephone Encounter (Signed)
Pt notified pain med refilled by dr.

## 2020-12-30 ENCOUNTER — Other Ambulatory Visit: Payer: Self-pay | Admitting: Family Medicine

## 2020-12-30 ENCOUNTER — Ambulatory Visit (INDEPENDENT_AMBULATORY_CARE_PROVIDER_SITE_OTHER): Payer: Medicare Other

## 2020-12-30 ENCOUNTER — Other Ambulatory Visit: Payer: Self-pay

## 2020-12-30 DIAGNOSIS — N138 Other obstructive and reflux uropathy: Secondary | ICD-10-CM | POA: Diagnosis not present

## 2020-12-30 DIAGNOSIS — N401 Enlarged prostate with lower urinary tract symptoms: Secondary | ICD-10-CM

## 2020-12-30 DIAGNOSIS — I10 Essential (primary) hypertension: Secondary | ICD-10-CM

## 2020-12-30 NOTE — Progress Notes (Signed)
Cath Change/ Replacement  Patient is present today for a catheter change due to urinary retention.  72ml of water was removed from the balloon, a 16FR coude foley cath was removed without difficulty.  Patient was cleaned and prepped in a sterile fashion with betadine. A 16 FR coude foley cath was replaced into the bladder no complications were noted Urine return was noted 21ml and urine was orange in color. The balloon was filled with 51ml of sterile water. A leg bag was attached for drainage.  Patient was given proper instruction on catheter care.    Performed by: Arnaldo Heffron, lpn  Follow up: keep next scheduled OV

## 2020-12-30 NOTE — Patient Instructions (Signed)

## 2020-12-31 ENCOUNTER — Encounter: Payer: Self-pay | Admitting: Family Medicine

## 2020-12-31 ENCOUNTER — Ambulatory Visit (INDEPENDENT_AMBULATORY_CARE_PROVIDER_SITE_OTHER): Payer: Medicare Other | Admitting: Family Medicine

## 2020-12-31 ENCOUNTER — Telehealth: Payer: Self-pay

## 2020-12-31 VITALS — BP 198/78 | HR 112 | Temp 97.7°F | Wt 244.2 lb

## 2020-12-31 DIAGNOSIS — I10 Essential (primary) hypertension: Secondary | ICD-10-CM

## 2020-12-31 DIAGNOSIS — N289 Disorder of kidney and ureter, unspecified: Secondary | ICD-10-CM | POA: Diagnosis not present

## 2020-12-31 LAB — COMPREHENSIVE METABOLIC PANEL
ALT: 10 IU/L (ref 0–44)
AST: 12 IU/L (ref 0–40)
Albumin/Globulin Ratio: 1.8 (ref 1.2–2.2)
Albumin: 4.4 g/dL (ref 3.8–4.8)
Alkaline Phosphatase: 59 IU/L (ref 44–121)
BUN/Creatinine Ratio: 9 — ABNORMAL LOW (ref 10–24)
BUN: 15 mg/dL (ref 8–27)
Bilirubin Total: 0.3 mg/dL (ref 0.0–1.2)
CO2: 17 mmol/L — ABNORMAL LOW (ref 20–29)
Calcium: 9.5 mg/dL (ref 8.6–10.2)
Chloride: 106 mmol/L (ref 96–106)
Creatinine, Ser: 1.64 mg/dL — ABNORMAL HIGH (ref 0.76–1.27)
GFR calc Af Amer: 49 mL/min/{1.73_m2} — ABNORMAL LOW (ref >59)
GFR calc non Af Amer: 42 mL/min/{1.73_m2} — ABNORMAL LOW (ref >59)
Globulin, Total: 2.4 g/dL (ref 1.5–4.5)
Glucose: 101 mg/dL — ABNORMAL HIGH (ref 65–99)
Potassium: 4.6 mmol/L (ref 3.5–5.2)
Sodium: 140 mmol/L (ref 134–144)
Total Protein: 6.8 g/dL (ref 6.0–8.5)

## 2020-12-31 MED ORDER — AMLODIPINE BESYLATE 5 MG PO TABS: 5.0000 mg | ORAL_TABLET | Freq: Every day | ORAL | 1 refills | Status: DC

## 2020-12-31 NOTE — Patient Instructions (Signed)
Avoid Aleve, it can raise blood pressure. Eat fruits, vegetables, and no more than 4 ounces of meat at one time for kidney health. Repeat labs for kidney function in 6 weeks, stay well hydrated before this lab work.

## 2020-12-31 NOTE — Progress Notes (Signed)
Pt here for blood pressure check. Pt needing refill on Verapamil 240 mg. Pt has blood pressure checked at radiation and at urology appt.  Pt would also like refill on Sildenafil 100 mg tablet (in history)    Patient ID: Tony Powell, male    DOB: 08-22-1951, 70 y.o.   MRN: [id number]   Chief Complaint  Patient presents with  . Hypertension   Subjective:  CC: medication mangement for HTN  This is not a new problem.  Presents today for medication management for hypertension.  Is going through bladder cancer which is causing him lots of pain, he reports that he gets lab work through the radiation oncologist, we are unable to see this on her EMR.  Yesterday he got a CMP drawn for today's visit, renal function is abnormal.  He is taking verapamil for his blood pressure, which is not adequately controlled.  He is in a great deal of pain, has been taking Aleve as well as opioid pain medications.  Denies fever, chills, chest pain, shortness of breath.  Once again, blood pressure not controlled.    Medical History Jahzeel has a past medical history of Abnormal stress test, Borderline hypertension, Chest discomfort, DJD (degenerative joint disease), Hyperlipidemia, Hypertension, and Prostate cancer ([place]).   Outpatient Encounter Medications as of 12/31/2020  Medication Sig  . amLODipine (NORVASC) 5 MG tablet Take 1 tablet (5 mg total) by mouth daily.  . ciprofloxacin (CIPRO) 250 MG tablet Take 1 tablet (250 mg total) by mouth 2 (two) times daily.  . finasteride (PROSCAR) 5 MG tablet Take 5 mg by mouth daily.   . megestrol (MEGACE) 20 MG tablet TAKE ONE (1) TABLET (20 MG TOTAL) BY MOUTH TWO (2) (TWO) TIMES DAILY AS NEEDED.  . naproxen sodium (ALEVE) 220 MG tablet Take 440 mg by mouth daily as needed (pain).   . ondansetron (ZOFRAN ODT) 4 MG disintegrating tablet Take 1 tablet (4 mg total) by mouth every 8 (eight) hours as needed for nausea or vomiting.  Marland Kitchen oxyCODONE-acetaminophen (PERCOCET) 5-325 MG  tablet Take 1 tablet by mouth every 4 (four) hours as needed for moderate pain or severe pain.  . [DISCONTINUED] verapamil (CALAN-SR) 240 MG CR tablet TAKE ONE (1) TABLET (240 MG TOTAL) BY MOUTH AT BEDTIME.   No facility-administered encounter medications on file as of 12/31/2020.     Review of Systems  Constitutional: Negative for chills and fever.  Respiratory: Negative for shortness of breath.   Cardiovascular: Negative for chest pain.  Gastrointestinal:       Bladder pain due to cancer.      Vitals BP (!) 198/78   Pulse (!) 112   Temp 97.7 F (36.5 C)   Wt 244 lb 3.2 oz (110.8 kg)   SpO2 98%   BMI 35.04 kg/m   Objective:   Physical Exam Vitals reviewed.  Cardiovascular:     Rate and Rhythm: Regular rhythm. Tachycardia present.     Heart sounds: Normal heart sounds.  Pulmonary:     Effort: Pulmonary effort is normal.     Breath sounds: Normal breath sounds.  Skin:    General: Skin is warm and dry.  Neurological:     General: No focal deficit present.     Mental Status: He is alert.  Psychiatric:        Behavior: Behavior normal.     Results for orders placed or performed in visit on 12/30/20  Comprehensive Metabolic Panel (CMET)  Result Value Ref Range  Glucose 101 (H) 65 - 99 mg/dL   BUN 15 8 - 27 mg/dL   Creatinine, Ser 1.64 (H) 0.76 - 1.27 mg/dL   GFR calc non Af Amer 42 (L) >59 mL/min/1.73   GFR calc Af Amer 49 (L) >59 mL/min/1.73   BUN/Creatinine Ratio 9 (L) 10 - 24   Sodium 140 134 - 144 mmol/L   Potassium 4.6 3.5 - 5.2 mmol/L   Chloride 106 96 - 106 mmol/L   CO2 17 (L) 20 - 29 mmol/L   Calcium 9.5 8.6 - 10.2 mg/dL   Total Protein 6.8 6.0 - 8.5 g/dL   Albumin 4.4 3.8 - 4.8 g/dL   Globulin, Total 2.4 1.5 - 4.5 g/dL   Albumin/Globulin Ratio 1.8 1.2 - 2.2   Bilirubin Total 0.3 0.0 - 1.2 mg/dL   Alkaline Phosphatase 59 44 - 121 IU/L   AST 12 0 - 40 IU/L   ALT 10 0 - 44 IU/L    Assessment and Plan   1. Elevated blood pressure reading in  office with diagnosis of hypertension - amLODipine (NORVASC) 5 MG tablet; Take 1 tablet (5 mg total) by mouth daily.  Dispense: 90 tablet; Refill: 1  2. Abnormal kidney function - Comprehensive Metabolic Panel (CMET)     Hypertension Medication compliance: Taking verapamil daily as prescribed.  Denies chest pain, shortness of breath, lower extremity edema, vision changes, headaches.  Pertinent lab work: CMP, yesterday, abnormal kidney function. ASCVD 10- year risk score: n/a at this visit. Monitoring: in six weeks repeat labs, then 6 months for routine management.  Request CMP in 6 weeks to follow-up on kidney function, increase fruits and vegetables, decrease animal protein, ensure adequate hydration prior to lab draw. Side effects: None Continue current medication regimen: Verapamil discontinued, started amlodipine 5 mg daily.  Agrees with plan of care discussed today. Understands warning signs to seek further care: chest pain, shortness of breath, any significant change in health.  Understands to follow-up in 6 weeks with CMP prior to visit.  Kidney function abnormal, blood pressure not well controlled on verapamil.  Changed to amlodipine.  After blood pressure is well controlled, can follow-up every 6 months for routine medication management.   Chalmers Guest, NP 12/31/2020

## 2021-01-06 NOTE — Telephone Encounter (Signed)
Md made aware

## 2021-01-11 ENCOUNTER — Telehealth: Payer: Self-pay

## 2021-01-14 NOTE — Telephone Encounter (Signed)
Voiding trial scheduled for Monday. Patient called and made aware.

## 2021-01-14 NOTE — Telephone Encounter (Signed)
NV for voiding trial

## 2021-01-17 ENCOUNTER — Other Ambulatory Visit: Payer: Self-pay

## 2021-01-17 ENCOUNTER — Ambulatory Visit (INDEPENDENT_AMBULATORY_CARE_PROVIDER_SITE_OTHER): Payer: Medicare Other

## 2021-01-17 DIAGNOSIS — R39198 Other difficulties with micturition: Secondary | ICD-10-CM | POA: Diagnosis not present

## 2021-01-17 DIAGNOSIS — D494 Neoplasm of unspecified behavior of bladder: Secondary | ICD-10-CM | POA: Diagnosis not present

## 2021-01-17 DIAGNOSIS — C61 Malignant neoplasm of prostate: Secondary | ICD-10-CM

## 2021-01-17 MED ORDER — MEGESTROL ACETATE 20 MG PO TABS: 20.0000 mg | ORAL_TABLET | Freq: Two times a day (BID) | ORAL | 3 refills | Status: DC

## 2021-01-17 NOTE — Addendum Note (Signed)
Addended by: Valentina Lucks on: 01/17/2021 03:30 PM   Modules accepted: Orders

## 2021-01-17 NOTE — Progress Notes (Signed)
Fill and Pull Catheter Removal  Patient is present today for a catheter removal.  Patient was cleaned and prepped in a sterile fashion 86ml of sterile water/ saline was attempted to instill winto the bladder. Pt experienced bladder spasms right away and water came straight back out. Talked with RN and 23ml of water was then drained from the balloon. Patient was then given some time to void on their own.  Patient cannot void on their own after some time.  Patient was told to go home and drink 2 or 3 16 oz bottles of water and to return this afternoon for PVR.  Performed by: Valentina Lucks, Lpn

## 2021-01-17 NOTE — Progress Notes (Addendum)
Bladder Scan Patient can void:3 ml Performed By: Jorge Ny  Also samples of Myrbetriq 50 mgs  28 # were given for spasms

## 2021-01-18 ENCOUNTER — Other Ambulatory Visit: Payer: Self-pay

## 2021-01-18 ENCOUNTER — Telehealth: Payer: Self-pay

## 2021-01-18 ENCOUNTER — Ambulatory Visit (INDEPENDENT_AMBULATORY_CARE_PROVIDER_SITE_OTHER): Payer: Medicare Other

## 2021-01-18 ENCOUNTER — Encounter: Payer: Self-pay | Admitting: Urology

## 2021-01-18 DIAGNOSIS — N138 Other obstructive and reflux uropathy: Secondary | ICD-10-CM

## 2021-01-18 DIAGNOSIS — R300 Dysuria: Secondary | ICD-10-CM

## 2021-01-18 DIAGNOSIS — N401 Enlarged prostate with lower urinary tract symptoms: Secondary | ICD-10-CM

## 2021-01-18 LAB — URINALYSIS, ROUTINE W REFLEX MICROSCOPIC
Bilirubin, UA: NEGATIVE
Ketones, UA: NEGATIVE
Nitrite, UA: POSITIVE — AB
Specific Gravity, UA: 1.02 (ref 1.005–1.030)
Urobilinogen, Ur: 1 mg/dL (ref 0.2–1.0)
pH, UA: 6.5 (ref 5.0–7.5)

## 2021-01-18 LAB — MICROSCOPIC EXAMINATION
RBC, Urine: >30 /hpf — AB (ref 0–2)
Renal Epithel, UA: NONE SEEN /hpf

## 2021-01-18 LAB — BLADDER SCAN AMB NON-IMAGING: Scan Result: 1

## 2021-01-18 MED ORDER — URIBEL 118 MG PO CAPS: 1.0000 | ORAL_CAPSULE | Freq: Two times a day (BID) | ORAL | 11 refills | Status: DC | PRN

## 2021-01-18 NOTE — Progress Notes (Signed)
Patient in office today complaining of dysuria and frequency.    Bladder Scan Patient can void: 1 ml Performed By: Durenda Guthrie, lpn  Urine sent for ua & culture. Uribel bid prn sent per Dr. Alyson Ingles.

## 2021-01-18 NOTE — Telephone Encounter (Signed)
Have him come in to drop off a urine sample and for a PVR

## 2021-01-18 NOTE — Telephone Encounter (Signed)
Pt came in to office today for a pvr and urine specimen.

## 2021-01-18 NOTE — Telephone Encounter (Signed)
Pt called saying he is constantly burning after cath taken out yesterday. Also having frequency. He is taking myrbetriq, AZO and megestrol. Also wants refill on pain med.

## 2021-01-19 ENCOUNTER — Other Ambulatory Visit: Payer: Medicare Other

## 2021-01-19 ENCOUNTER — Other Ambulatory Visit: Payer: Self-pay

## 2021-01-19 ENCOUNTER — Telehealth: Payer: Self-pay

## 2021-01-19 DIAGNOSIS — R300 Dysuria: Secondary | ICD-10-CM

## 2021-01-19 MED ORDER — URIBEL 118 MG PO CAPS: 1.0000 | ORAL_CAPSULE | Freq: Two times a day (BID) | ORAL | 11 refills | Status: DC | PRN

## 2021-01-19 MED ORDER — UROGESIC-BLUE 81.6 MG PO TABS
81.6000 mg | ORAL_TABLET | Freq: Two times a day (BID) | ORAL | 0 refills | Status: DC | PRN
Start: 2021-01-19 — End: 2021-04-18

## 2021-01-19 NOTE — Progress Notes (Signed)
Pt notified of new med sent in instead of uribel. Pt asked for pain meds. Dr. Alyson Ingles said pain meds would not help the burning and dysuria symptoms. Pt notified.

## 2021-01-19 NOTE — Telephone Encounter (Signed)
Patient calling about his Meth-Hyo-M Bl-Na Phos-Ph Sal (URIBEL) 118 MG CAPS Refill.   Pharmacy doesn't have this medication in stock. Asking if another Rx can be prescribed that the pharmacy will have in stock.   Data processing manager at [facility], [address] Phone:  [phone]  Fax:  [phone]     Please call patient back to advise at [phone].  Thanks, Helene Kelp

## 2021-01-19 NOTE — Telephone Encounter (Signed)
Returned patient call. Sent Uribel to [facility] on [address] per pt request.

## 2021-01-19 NOTE — Telephone Encounter (Signed)
Pt called saying he was unable to get Urogesic blue also but was able to get Uribel at [facility]. He will just pick that up.

## 2021-01-20 LAB — URINE CULTURE: Organism ID, Bacteria: NO GROWTH

## 2021-01-21 ENCOUNTER — Other Ambulatory Visit: Payer: Self-pay

## 2021-01-21 ENCOUNTER — Ambulatory Visit: Payer: Medicare Other

## 2021-01-21 ENCOUNTER — Encounter (HOSPITAL_COMMUNITY): Payer: Self-pay

## 2021-01-21 ENCOUNTER — Inpatient Hospital Stay (HOSPITAL_COMMUNITY)
Admission: EM | Admit: 2021-01-21 | Discharge: 2021-01-23 | DRG: 683 | Disposition: A | Discharge status: Home / Self Care | Payer: Medicare Other | Attending: Family Medicine | Admitting: Family Medicine

## 2021-01-21 DIAGNOSIS — Z806 Family history of leukemia: Secondary | ICD-10-CM

## 2021-01-21 DIAGNOSIS — C61 Malignant neoplasm of prostate: Secondary | ICD-10-CM | POA: Diagnosis present

## 2021-01-21 DIAGNOSIS — Z1624 Resistance to multiple antibiotics: Secondary | ICD-10-CM | POA: Diagnosis present

## 2021-01-21 DIAGNOSIS — Z79899 Other long term (current) drug therapy: Secondary | ICD-10-CM

## 2021-01-21 DIAGNOSIS — E860 Dehydration: Secondary | ICD-10-CM | POA: Diagnosis present

## 2021-01-21 DIAGNOSIS — Z8249 Family history of ischemic heart disease and other diseases of the circulatory system: Secondary | ICD-10-CM

## 2021-01-21 DIAGNOSIS — D630 Anemia in neoplastic disease: Secondary | ICD-10-CM | POA: Diagnosis present

## 2021-01-21 DIAGNOSIS — D494 Neoplasm of unspecified behavior of bladder: Secondary | ICD-10-CM | POA: Diagnosis not present

## 2021-01-21 DIAGNOSIS — R39198 Other difficulties with micturition: Secondary | ICD-10-CM | POA: Diagnosis not present

## 2021-01-21 DIAGNOSIS — I10 Essential (primary) hypertension: Secondary | ICD-10-CM | POA: Diagnosis present

## 2021-01-21 DIAGNOSIS — Z8551 Personal history of malignant neoplasm of bladder: Secondary | ICD-10-CM

## 2021-01-21 DIAGNOSIS — B965 Pseudomonas (aeruginosa) (mallei) (pseudomallei) as the cause of diseases classified elsewhere: Secondary | ICD-10-CM | POA: Diagnosis present

## 2021-01-21 DIAGNOSIS — Z87440 Personal history of urinary (tract) infections: Secondary | ICD-10-CM

## 2021-01-21 DIAGNOSIS — N138 Other obstructive and reflux uropathy: Secondary | ICD-10-CM | POA: Diagnosis present

## 2021-01-21 DIAGNOSIS — R197 Diarrhea, unspecified: Secondary | ICD-10-CM | POA: Diagnosis present

## 2021-01-21 DIAGNOSIS — N390 Urinary tract infection, site not specified: Secondary | ICD-10-CM | POA: Diagnosis not present

## 2021-01-21 DIAGNOSIS — D72819 Decreased white blood cell count, unspecified: Secondary | ICD-10-CM | POA: Diagnosis present

## 2021-01-21 DIAGNOSIS — E876 Hypokalemia: Secondary | ICD-10-CM | POA: Diagnosis present

## 2021-01-21 DIAGNOSIS — R310 Gross hematuria: Secondary | ICD-10-CM | POA: Diagnosis present

## 2021-01-21 DIAGNOSIS — N179 Acute kidney failure, unspecified: Principal | ICD-10-CM | POA: Diagnosis present

## 2021-01-21 DIAGNOSIS — Y846 Urinary catheterization as the cause of abnormal reaction of the patient, or of later complication, without mention of misadventure at the time of the procedure: Secondary | ICD-10-CM | POA: Diagnosis present

## 2021-01-21 DIAGNOSIS — Z20822 Contact with and (suspected) exposure to covid-19: Secondary | ICD-10-CM | POA: Diagnosis present

## 2021-01-21 DIAGNOSIS — Z87891 Personal history of nicotine dependence: Secondary | ICD-10-CM

## 2021-01-21 DIAGNOSIS — B952 Enterococcus as the cause of diseases classified elsewhere: Secondary | ICD-10-CM | POA: Diagnosis present

## 2021-01-21 DIAGNOSIS — T83518A Infection and inflammatory reaction due to other urinary catheter, initial encounter: Principal | ICD-10-CM | POA: Diagnosis present

## 2021-01-21 DIAGNOSIS — N3000 Acute cystitis without hematuria: Secondary | ICD-10-CM

## 2021-01-21 LAB — URINALYSIS, ROUTINE W REFLEX MICROSCOPIC
Bacteria, UA: NONE SEEN
Bilirubin Urine: NEGATIVE
Glucose, UA: NEGATIVE mg/dL
Ketones, ur: NEGATIVE mg/dL
Nitrite: POSITIVE — AB
Protein, ur: 100 mg/dL — AB
RBC / HPF: >50 RBC/hpf — ABNORMAL HIGH (ref 0–5)
Specific Gravity, Urine: 1.01 (ref 1.005–1.030)
pH: 7 (ref 5.0–8.0)

## 2021-01-21 LAB — CBC WITH DIFFERENTIAL/PLATELET
Abs Immature Granulocytes: 0.02 10*3/uL (ref 0.00–0.07)
Basophils Absolute: 0 10*3/uL (ref 0.0–0.1)
Basophils Relative: 0 %
Eosinophils Absolute: 0.1 10*3/uL (ref 0.0–0.5)
Eosinophils Relative: 1 %
HCT: 32 % — ABNORMAL LOW (ref 39.0–52.0)
Hemoglobin: 10.2 g/dL — ABNORMAL LOW (ref 13.0–17.0)
Immature Granulocytes: 0 %
Lymphocytes Relative: 12 %
Lymphs Abs: 0.7 10*3/uL (ref 0.7–4.0)
MCH: 26.7 pg (ref 26.0–34.0)
MCHC: 31.9 g/dL (ref 30.0–36.0)
MCV: 83.8 fL (ref 80.0–100.0)
Monocytes Absolute: 0.5 10*3/uL (ref 0.1–1.0)
Monocytes Relative: 10 %
Neutro Abs: 3.9 10*3/uL (ref 1.7–7.7)
Neutrophils Relative %: 77 %
Platelets: 271 10*3/uL (ref 150–400)
RBC: 3.82 MIL/uL — ABNORMAL LOW (ref 4.22–5.81)
RDW: 16.8 % — ABNORMAL HIGH (ref 11.5–15.5)
WBC: 5.2 10*3/uL (ref 4.0–10.5)
nRBC: 0 % (ref 0.0–0.2)

## 2021-01-21 LAB — MAGNESIUM: Magnesium: 1.7 mg/dL (ref 1.7–2.4)

## 2021-01-21 LAB — BASIC METABOLIC PANEL
Anion gap: 12 (ref 5–15)
BUN: 10 mg/dL (ref 8–23)
CO2: 22 mmol/L (ref 22–32)
Calcium: 8.8 mg/dL — ABNORMAL LOW (ref 8.9–10.3)
Chloride: 103 mmol/L (ref 98–111)
Creatinine, Ser: 1.5 mg/dL — ABNORMAL HIGH (ref 0.61–1.24)
GFR, Estimated: 50 mL/min — ABNORMAL LOW (ref >60)
Glucose, Bld: 106 mg/dL — ABNORMAL HIGH (ref 70–99)
Potassium: 2.5 mmol/L — CL (ref 3.5–5.1)
Sodium: 137 mmol/L (ref 135–145)

## 2021-01-21 MED ORDER — UROGESIC-BLUE 81.6 MG PO TABS: 81.6000 mg | ORAL_TABLET | Freq: Two times a day (BID) | ORAL | Status: DC | PRN

## 2021-01-21 MED ORDER — LOPERAMIDE HCL 2 MG PO CAPS
2.0000 mg | ORAL_CAPSULE | ORAL | Status: DC | PRN
  Administered 2021-01-21: 2 mg via ORAL
  Filled 2021-01-21: qty 1

## 2021-01-21 MED ORDER — SODIUM CHLORIDE 0.9 % IV SOLN: INTRAVENOUS | Status: DC

## 2021-01-21 MED ORDER — SODIUM CHLORIDE 0.9 % IV SOLN: 1.0000 g | INTRAVENOUS | Status: DC

## 2021-01-21 MED ORDER — OXYCODONE-ACETAMINOPHEN 5-325 MG PO TABS
1.0000 | ORAL_TABLET | ORAL | Status: DC | PRN
Start: 2021-01-21 — End: 2021-01-22
  Administered 2021-01-21 – 2021-01-22 (×3): 1 via ORAL
  Filled 2021-01-21 (×4): qty 1

## 2021-01-21 MED ORDER — ONDANSETRON HCL 4 MG/2ML IJ SOLN
4.0000 mg | Freq: Once | INTRAMUSCULAR | Status: AC
  Administered 2021-01-21: 4 mg via INTRAVENOUS
  Filled 2021-01-21: qty 2

## 2021-01-21 MED ORDER — POTASSIUM CHLORIDE CRYS ER 20 MEQ PO TBCR
40.0000 meq | EXTENDED_RELEASE_TABLET | Freq: Once | ORAL | Status: AC
  Administered 2021-01-21: 40 meq via ORAL
  Filled 2021-01-21: qty 2

## 2021-01-21 MED ORDER — POTASSIUM CHLORIDE CRYS ER 20 MEQ PO TBCR
20.0000 meq | EXTENDED_RELEASE_TABLET | Freq: Two times a day (BID) | ORAL | Status: DC
  Administered 2021-01-21 – 2021-01-23 (×4): 20 meq via ORAL
  Filled 2021-01-21 (×4): qty 1

## 2021-01-21 MED ORDER — AMLODIPINE BESYLATE 5 MG PO TABS
5.0000 mg | ORAL_TABLET | Freq: Every day | ORAL | Status: DC
  Administered 2021-01-22: 5 mg via ORAL
  Filled 2021-01-21: qty 1

## 2021-01-21 MED ORDER — SODIUM CHLORIDE 0.9 % IV SOLN
1.0000 g | Freq: Once | INTRAVENOUS | Status: AC
  Administered 2021-01-21: 1 g via INTRAVENOUS
  Filled 2021-01-21: qty 10

## 2021-01-21 MED ORDER — POTASSIUM CHLORIDE 10 MEQ/100ML IV SOLN
10.0000 meq | INTRAVENOUS | Status: AC
Start: 2021-01-21 — End: 2021-01-21
  Administered 2021-01-21 (×2): 10 meq via INTRAVENOUS
  Filled 2021-01-21 (×2): qty 100

## 2021-01-21 MED ORDER — POTASSIUM CHLORIDE 10 MEQ/100ML IV SOLN
10.0000 meq | INTRAVENOUS | Status: AC
Start: 2021-01-21 — End: 2021-01-22
  Administered 2021-01-21 (×2): 10 meq via INTRAVENOUS
  Filled 2021-01-21: qty 100

## 2021-01-21 MED ORDER — FINASTERIDE 5 MG PO TABS
5.0000 mg | ORAL_TABLET | Freq: Every day | ORAL | Status: DC
  Administered 2021-01-22 – 2021-01-23 (×2): 5 mg via ORAL
  Filled 2021-01-21 (×2): qty 1

## 2021-01-21 MED ORDER — ONDANSETRON HCL 4 MG/2ML IJ SOLN: 4.0000 mg | Freq: Four times a day (QID) | INTRAMUSCULAR | Status: DC | PRN

## 2021-01-21 MED ORDER — POTASSIUM CHLORIDE 10 MEQ/100ML IV SOLN
10.0000 meq | INTRAVENOUS | Status: DC
  Administered 2021-01-21: 10 meq via INTRAVENOUS
  Filled 2021-01-21 (×2): qty 100

## 2021-01-21 MED ORDER — HYDROMORPHONE HCL 1 MG/ML IJ SOLN
1.0000 mg | Freq: Once | INTRAMUSCULAR | Status: AC
  Administered 2021-01-21: 1 mg via INTRAVENOUS
  Filled 2021-01-21: qty 1

## 2021-01-21 MED ORDER — MEGESTROL ACETATE 40 MG PO TABS
20.0000 mg | ORAL_TABLET | Freq: Two times a day (BID) | ORAL | Status: DC
  Administered 2021-01-21 – 2021-01-23 (×4): 20 mg via ORAL
  Filled 2021-01-21 (×6): qty 0.5
  Filled 2021-01-21: qty 1

## 2021-01-21 MED ORDER — ONDANSETRON HCL 4 MG PO TABS: 4.0000 mg | ORAL_TABLET | Freq: Four times a day (QID) | ORAL | Status: DC | PRN

## 2021-01-21 NOTE — ED Notes (Signed)

## 2021-01-21 NOTE — ED Notes (Signed)
Date and time results received: 01/21/21 3:12 PM  Test: potassium Critical Value: 2.5  Name of Provider Notified: [email or web address]  Orders Received? Or Actions Taken?: md notified

## 2021-01-21 NOTE — ED Triage Notes (Signed)
Pt reports had a foley cath removed 2 days ago and has had urinary frequency and incontinence since then.  Reports passing blood clots in urine.  C/O pain in penis.

## 2021-01-21 NOTE — ED Provider Notes (Signed)
[facility] Provider Note   CSN: [id number] Arrival date & time: 01/21/21  1008     History Chief Complaint  Patient presents with  . Urinary Frequency    COREE BRAME is a 70 y.o. male.  Patient presenting with a complaint of urinary frequency dysuria discomfort and what he deems to be incontinence.  Patient followed by Dr. Noah Delaine from urology here in the urology clinic.  Patient had a Foley catheter through most of the fall.  Recently removed.  Foley catheter due to prostate cancer.  Patient was having urinary retention problems.  Since the removal of the Foley catheter has not been retention problems or been more of sort of an incontinence and burning problem.  They have tried medications for him to help with that.  Patient recently was on Cipro.  Patient has formal evaluation by Dr. Alyson Ingles on March 18 and they were planning to do a cystoscopy.  In addition patient has been having diarrhea for about the past 4 days.  No blood in it.  Not watery.  Not associated with any abdominal pain.  No fevers.  Chart review shows that patient has been on oxycodone for a prolonged period of time.  Last prescription probably ran out right at the beginning of March.        Past Medical History:  Diagnosis Date  . Abnormal stress test   . Borderline hypertension   . Chest discomfort    Atypical  . DJD (degenerative joint disease)    Knees, lumbar sacral spine  . Hyperlipidemia   . Hypertension   . Prostate cancer [facility])     Patient Active Problem List   Diagnosis Date Noted  . Abnormal kidney function 12/31/2020  . Prostate cancer ([place]) 08/30/2020  . Bladder tumor 08/23/2020  . Gross hematuria 08/19/2020  . Difficulty voiding 08/13/2020  . 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/13/2020  . History of prostatitis 05/02/2020  . HNP (herniated nucleus pulposus), lumbar 04/16/2013  . HYPERLIPIDEMIA 05/04/2010  . OVERWEIGHT 05/04/2010  .  Elevated blood pressure reading in office with diagnosis of hypertension 04/28/2010  . DEGENERATIVE JOINT DISEASE 04/28/2010  . CHEST PAIN 04/28/2010  . ABNORMAL STRESS ELECTROCARDIOGRAM 04/28/2010    Past Surgical History:  Procedure Laterality Date  . BACK SURGERY    . TRANSURETHRAL RESECTION OF BLADDER TUMOR N/A 08/25/2020   Procedure: TRANSURETHRAL RESECTION OF BLADDER TUMOR (TURBT);  Surgeon: Cleon Gustin, MD;  Location: [facility];  Service: Urology;  Laterality: N/A;       Family History  Problem Relation Age of Onset  . Heart attack Mother   . Breast cancer Mother        Mastectomy  . Leukemia Father   . Cancer Brother   . Cancer Brother   . Cancer Brother   . Cancer Brother   . Cancer Brother   . Colon cancer Neg Hx   . Prostate cancer Neg Hx   . Pancreatic cancer Neg Hx     Social History   Tobacco Use  . Smoking status: Former Smoker    Packs/day: 1.00    Years: 20.00    Pack years: 20.00    Quit date: 11/13/1998    Years since quitting: 22.2  . Smokeless tobacco: Never Used  Substance Use Topics  . Alcohol use: No  . Drug use: Never    Home Medications Prior to Admission medications   Medication Sig Start Date End Date Taking? Authorizing Provider  amLODipine (NORVASC) 5 MG tablet Take 1 tablet (5 mg total) by mouth daily. 12/31/20  Yes Chalmers Guest, NP  finasteride (PROSCAR) 5 MG tablet Take 5 mg by mouth daily.  07/02/20  Yes [provider]  megestrol (MEGACE) 20 MG tablet Take 1 tablet (20 mg total) by mouth 2 (two) times daily. As needed. Patient taking differently: Take 20 mg by mouth 2 (two) times daily as needed (urinary symptoms). 01/17/21  Yes McKenzie, Candee Furbish, MD  Meth-Hyo-M Barnett Hatter Phos-Ph Sal (URIBEL) 118 MG CAPS Take 1 capsule (118 mg total) by mouth 2 (two) times daily as needed. 01/19/21  Yes McKenzie, Candee Furbish, MD  naproxen sodium (ALEVE) 220 MG tablet Take 440 mg by mouth daily as needed (pain).    Yes [provider]  ciprofloxacin (CIPRO) 250 MG tablet Take 1 tablet (250 mg total) by mouth 2 (two) times daily. Patient not taking: No sig reported 12/10/20   Cleon Gustin, MD  megestrol (MEGACE) 20 MG tablet TAKE ONE (1) TABLET (20 MG TOTAL) BY MOUTH TWO (2) (TWO) TIMES DAILY AS NEEDED. 12/16/20   McKenzie, Candee Furbish, MD  Methen-Hyosc-Meth Blue-Na Phos (UROGESIC-BLUE) 81.6 MG TABS Take 1 tablet (81.6 mg total) by mouth 2 (two) times daily as needed. Patient not taking: No sig reported 01/19/21   Cleon Gustin, MD  ondansetron (ZOFRAN ODT) 4 MG disintegrating tablet Take 1 tablet (4 mg total) by mouth every 8 (eight) hours as needed for nausea or vomiting. Patient not taking: Reported on 01/21/2021 08/26/20   Cleon Gustin, MD  oxyCODONE-acetaminophen (PERCOCET) 5-325 MG tablet Take 1 tablet by mouth every 4 (four) hours as needed for moderate pain or severe pain. Patient not taking: No sig reported 12/27/20 12/27/21  Cleon Gustin, MD    Allergies    Celebrex [celecoxib]  Review of Systems   Review of Systems  Constitutional: Negative for chills and fever.  HENT: Negative for congestion, rhinorrhea and sore throat.   Eyes: Negative for visual disturbance.  Respiratory: Negative for cough and shortness of breath.   Cardiovascular: Negative for chest pain and leg swelling.  Gastrointestinal: Positive for diarrhea. Negative for abdominal pain, nausea and vomiting.  Genitourinary: Positive for dysuria, frequency and urgency.  Musculoskeletal: Negative for back pain and neck pain.  Skin: Negative for rash.  Neurological: Negative for dizziness, light-headedness and headaches.  Hematological: Does not bruise/bleed easily.  Psychiatric/Behavioral: Negative for confusion.    Physical Exam Updated Vital Signs BP (!) 168/73 (BP Location: Left Arm)   Pulse 90   Temp 99.2 F (37.3 C) (Oral)   Resp 14   Ht 1.778 m (5\' 10" )   Wt 110.2 kg   SpO2 97%   BMI 34.87 kg/m   Physical  Exam Vitals and nursing note reviewed.  Constitutional:      Appearance: Normal appearance. He is well-developed.  HENT:     Head: Normocephalic and atraumatic.  Eyes:     Extraocular Movements: Extraocular movements intact.     Conjunctiva/sclera: Conjunctivae normal.     Pupils: Pupils are equal, round, and reactive to light.  Cardiovascular:     Rate and Rhythm: Normal rate and regular rhythm.     Heart sounds: No murmur heard.   Pulmonary:     Effort: Pulmonary effort is normal. No respiratory distress.     Breath sounds: Normal breath sounds.  Abdominal:     General: There is no distension.     Palpations: Abdomen  is soft.     Tenderness: There is no abdominal tenderness. There is no guarding.     Comments: Abdomen soft and nontender.  Some question of may be bladder distention in the suprapubic area.  Genitourinary:    Penis: Normal.      Comments: GU without any significant abnormalities. Musculoskeletal:     Cervical back: Normal range of motion and neck supple.  Skin:    General: Skin is warm and dry.  Neurological:     General: No focal deficit present.     Mental Status: He is alert and oriented to person, place, and time.     Cranial Nerves: No cranial nerve deficit.     Sensory: No sensory deficit.     ED Results / Procedures / Treatments   Labs (all labs ordered are listed, but only abnormal results are displayed) Labs Reviewed  URINALYSIS, ROUTINE W REFLEX MICROSCOPIC - Abnormal; Notable for the following components:      Result Value   Color, Urine AMBER (*)    Hgb urine dipstick LARGE (*)    Protein, ur 100 (*)    Nitrite POSITIVE (*)    Leukocytes,Ua TRACE (*)    RBC / HPF >50 (*)    All other components within normal limits  CBC WITH DIFFERENTIAL/PLATELET - Abnormal; Notable for the following components:   RBC 3.82 (*)    Hemoglobin 10.2 (*)    HCT 32.0 (*)    RDW 16.8 (*)    All other components within normal limits  BASIC METABOLIC PANEL -  Abnormal; Notable for the following components:   Potassium 2.5 (*)    Glucose, Bld 106 (*)    Creatinine, Ser 1.50 (*)    Calcium 8.8 (*)    GFR, Estimated 50 (*)    All other components within normal limits  URINE CULTURE    EKG None  Radiology No results found.  Procedures Procedures   CRITICAL CARE Performed by: Fredia Sorrow Total critical care time: 45 minutes Critical care time was exclusive of separately billable procedures and treating other patients. Critical care was necessary to treat or prevent imminent or life-threatening deterioration. Critical care was time spent personally by me on the following activities: development of treatment plan with patient and/or surrogate as well as nursing, discussions with consultants, evaluation of patient's response to treatment, examination of patient, obtaining history from patient or surrogate, ordering and performing treatments and interventions, ordering and review of laboratory studies, ordering and review of radiographic studies, pulse oximetry and re-evaluation of patient's condition.   Medications Ordered in ED Medications  0.9 %  sodium chloride infusion (0 mLs Intravenous Stopped 01/21/21 1546)  cefTRIAXone (ROCEPHIN) 1 g in sodium chloride 0.9 % 100 mL IVPB (has no administration in time range)  potassium chloride SA (KLOR-CON) CR tablet 40 mEq (has no administration in time range)  potassium chloride 10 mEq in 100 mL IVPB (has no administration in time range)  HYDROmorphone (DILAUDID) injection 1 mg (1 mg Intravenous Given 01/21/21 1331)  ondansetron (ZOFRAN) injection 4 mg (4 mg Intravenous Given 01/21/21 1328)    ED Course  I have reviewed the triage vital signs and the nursing notes.  Pertinent labs & imaging results that were available during my care of the patient were reviewed by me and considered in my medical decision making (see chart for details).    MDM Rules/Calculators/A&P  Bladder scan was done.  Patient had minimal amount of urine retained.  About less than 100 cc.  Analysis here seems to be consistent with infection positive nitrite.  Sent for culture.  Patient's pain complaints significantly improved with IV hydromorphone.  Patient still with the diarrhea.  Possible diarrhea could secondary to narcotic withdrawal.  Also since he was on Cipro by urology is possible that it could be C. difficile.  Patient given IV Rocephin for the urinary tract infection and cultures pending.  Patient without a leukocytosis.  Hemoglobin stable at 10.2.  Patient's renal function baseline.  However his potassium was critically low at 2.5.  In the face of the diarrhea feel that he needs a IV supplementation we will also give 40 mEq p.o.  10 mEq IV x2 ordered.  And patient will require admission.  Discussed with hospitalist who will admit.  Also had discussion with patient whether he wanted Foley catheter back in place and he decided not because he did state that he was having a lot of discomfort when it was in before even though he has persisted with discomfort with it out.  May be with treatment of the urinary tract infection that discomfort will improve.    Final Clinical Impression(s) / ED Diagnoses Final diagnoses:  Hypokalemia  Acute cystitis without hematuria  Diarrhea, unspecified type    Rx / DC Orders ED Discharge Orders    None       Fredia Sorrow, MD 01/21/21 1558

## 2021-01-21 NOTE — ED Notes (Addendum)
Dr. Nehemiah Settle paged regarding diet order and COVID test for admission.  Currently awaiting a page back at this time.  5:23 PM New orders obtained for COVID testing and diet at this time.

## 2021-01-21 NOTE — H&P (Signed)
History and Physical  Tony Powell YPP:[id number] DOB: 01/28/1951 DOA: 01/21/2021  Referring physician: Dr Rogene Houston, ED physician PCP: Erven Colla, DO  Outpatient Specialists: [facility] (urology)  Patient Coming From: home  Chief Complaint: pain with urinating  HPI: Tony Powell is a 70 y.o. 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  Since then, the patient has had significant urinary frequency and dysuria.  He often has a feeling of incomplete bladder emptying and sharp pains in his urethra that occurred frequently throughout the day.  Pain medicine helps with the pain.  He has been taking oxycodone every 4-6 hours.  A few days ago, he had a urinalysis suggestive of UTI with a urine culture which was negative.  Additionally, he has had a lot of watery diarrhea with multiple episodes a day.  No palliating or provoking factors.  Denies fevers, chills, nausea, vomiting.  Emergency Department Course: UTI with nitrites.  Potassium 2.5.  White count 5.2.  Patient started on Rocephin, started on potassium.  Review of Systems:   Pt denies any fevers, chills, nausea, vomiting, constipation, abdominal pain, shortness of breath, dyspnea on exertion, orthopnea, cough, wheezing, palpitations, headache, vision changes, lightheadedness, dizziness, melena, rectal bleeding.  Review of systems are otherwise negative  Past Medical History:  Diagnosis Date  . Abnormal stress test   . Borderline hypertension   . Chest discomfort    Atypical  . DJD (degenerative joint disease)    Knees, lumbar sacral spine  . Hyperlipidemia   . Hypertension   . Prostate cancer [facility])    Past Surgical History:  Procedure Laterality Date  . BACK SURGERY    . TRANSURETHRAL RESECTION OF BLADDER TUMOR N/A 08/25/2020   Procedure: TRANSURETHRAL RESECTION OF BLADDER TUMOR  (TURBT);  Surgeon: Cleon Gustin, MD;  Location: [facility];  Service: Urology;  Laterality: N/A;   Social History:  reports that he quit smoking about 22 years ago. He has a 20.00 pack-year smoking history. He has never used smokeless tobacco. He reports that he does not drink alcohol and does not use drugs. Patient lives at home  Allergies  Allergen Reactions  . Celebrex [Celecoxib]     Eyes drooping and messed up skin on hands    Family History  Problem Relation Age of Onset  . Heart attack Mother   . Breast cancer Mother        Mastectomy  . Leukemia Father   . Cancer Brother   . Cancer Brother   . Cancer Brother   . Cancer Brother   . Cancer Brother   . Colon cancer Neg Hx   . Prostate cancer Neg Hx   . Pancreatic cancer Neg Hx       Prior to Admission medications   Medication Sig Start Date End Date Taking? Authorizing Provider  amLODipine (NORVASC) 5 MG tablet Take 1 tablet (5 mg total) by mouth daily. 12/31/20  Yes Chalmers Guest, NP  finasteride (PROSCAR) 5 MG tablet Take 5 mg by mouth daily.  07/02/20  Yes [provider]  megestrol (MEGACE) 20 MG tablet Take 1 tablet (20 mg total) by mouth 2 (two) times daily. As needed. Patient taking differently: Take 20 mg by mouth 2 (two) times daily as needed (urinary symptoms). 01/17/21  Yes McKenzie, Candee Furbish, MD  Meth-Hyo-M Barnett Hatter Phos-Ph Sal (URIBEL) 118 MG CAPS Take 1  capsule (118 mg total) by mouth 2 (two) times daily as needed. 01/19/21  Yes McKenzie, Candee Furbish, MD  naproxen sodium (ALEVE) 220 MG tablet Take 440 mg by mouth daily as needed (pain).    Yes [provider]  ciprofloxacin (CIPRO) 250 MG tablet Take 1 tablet (250 mg total) by mouth 2 (two) times daily. Patient not taking: No sig reported 12/10/20   Cleon Gustin, MD  megestrol (MEGACE) 20 MG tablet TAKE ONE (1) TABLET (20 MG TOTAL) BY MOUTH TWO (2) (TWO) TIMES DAILY AS NEEDED. 12/16/20   McKenzie, Candee Furbish, MD  Methen-Hyosc-Meth Blue-Na Phos  (UROGESIC-BLUE) 81.6 MG TABS Take 1 tablet (81.6 mg total) by mouth 2 (two) times daily as needed. Patient not taking: No sig reported 01/19/21   Cleon Gustin, MD  ondansetron (ZOFRAN ODT) 4 MG disintegrating tablet Take 1 tablet (4 mg total) by mouth every 8 (eight) hours as needed for nausea or vomiting. Patient not taking: Reported on 01/21/2021 08/26/20   Cleon Gustin, MD  oxyCODONE-acetaminophen (PERCOCET) 5-325 MG tablet Take 1 tablet by mouth every 4 (four) hours as needed for moderate pain or severe pain. Patient not taking: No sig reported 12/27/20 12/27/21  Cleon Gustin, MD    Physical Exam: BP (!) 156/84 (BP Location: Left Arm)   Pulse 97   Temp 98.8 F (37.1 C) (Oral)   Resp 16   Ht 5\' 10"  (1.778 m)   Wt 110.2 kg   SpO2 100%   BMI 34.87 kg/m   . General: Elderly male. Awake and alert and oriented x3. No acute cardiopulmonary distress.  Marland Kitchen HEENT: Normocephalic atraumatic.  Right and left ears normal in appearance.  Pupils equal, round, reactive to light. Extraocular muscles are intact. Sclerae anicteric and noninjected.  Moist mucosal membranes. No mucosal lesions.  . Neck: Neck supple without lymphadenopathy. No carotid bruits. No masses palpated.  . Cardiovascular: Regular rate with normal S1-S2 sounds. No murmurs, rubs, gallops auscultated. No JVD.  Marland Kitchen Respiratory: Good respiratory effort with no wheezes, rales, rhonchi. Lungs clear to auscultation bilaterally.  No accessory muscle use. . Abdomen: Soft, nontender, nondistended. Active bowel sounds. No masses or hepatosplenomegaly  . Skin: No rashes, lesions, or ulcerations.  Dry, warm to touch. 2+ dorsalis pedis and radial pulses. . Musculoskeletal: No calf or leg pain. All major joints not erythematous nontender.  No upper or lower joint deformation.  Good ROM.  No contractures  . Psychiatric: Intact judgment and insight. Pleasant and cooperative. . Neurologic: No focal neurological deficits. Strength is 5/5  and symmetric in upper and lower extremities.  Cranial nerves II through XII are grossly intact.           Labs on Admission: I have personally reviewed following labs and imaging studies  CBC: Recent Labs  Lab 01/21/21 1326  WBC 5.2  NEUTROABS 3.9  HGB 10.2*  HCT 32.0*  MCV 83.8  PLT 585   Basic Metabolic Panel: Recent Labs  Lab 01/21/21 1326  NA 137  K 2.5*  CL 103  CO2 22  GLUCOSE 106*  BUN 10  CREATININE 1.50*  CALCIUM 8.8*   GFR: Estimated Creatinine Clearance: 57.8 mL/min (A) (by C-G formula based on SCr of 1.5 mg/dL (H)). Liver Function Tests: No results for input(s): AST, ALT, ALKPHOS, BILITOT, PROT, ALBUMIN in the last 168 hours. No results for input(s): LIPASE, AMYLASE in the last 168 hours. No results for input(s): AMMONIA in the last 168 hours. Coagulation Profile: No  results for input(s): INR, PROTIME in the last 168 hours. Cardiac Enzymes: No results for input(s): CKTOTAL, CKMB, CKMBINDEX, TROPONINI in the last 168 hours. BNP (last 3 results) No results for input(s): PROBNP in the last 8760 hours. HbA1C: No results for input(s): HGBA1C in the last 72 hours. CBG: No results for input(s): GLUCAP in the last 168 hours. Lipid Profile: No results for input(s): CHOL, HDL, LDLCALC, TRIG, CHOLHDL, LDLDIRECT in the last 72 hours. Thyroid Function Tests: No results for input(s): TSH, T4TOTAL, FREET4, T3FREE, THYROIDAB in the last 72 hours. Anemia Panel: No results for input(s): VITAMINB12, FOLATE, FERRITIN, TIBC, IRON, RETICCTPCT in the last 72 hours. Urine analysis:    Component Value Date/Time   COLORURINE AMBER (A) 01/21/2021 1317   APPEARANCEUR CLEAR 01/21/2021 1317   APPEARANCEUR Cloudy (A) 01/18/2021 1627   LABSPEC 1.010 01/21/2021 1317   PHURINE 7.0 01/21/2021 1317   GLUCOSEU NEGATIVE 01/21/2021 1317   HGBUR LARGE (A) 01/21/2021 1317   BILIRUBINUR NEGATIVE 01/21/2021 1317   BILIRUBINUR Negative 01/18/2021 [place] 01/21/2021  1317   PROTEINUR 100 (A) 01/21/2021 1317   NITRITE POSITIVE (A) 01/21/2021 1317   LEUKOCYTESUR TRACE (A) 01/21/2021 1317   Sepsis Labs: @LABRCNTIP (procalcitonin:4,lacticidven:4) ) Recent Results (from the past 240 hour(s))  Urine Culture     Status: None   Collection Time: 01/18/21  4:27 PM   Specimen: Urine   Urine  Result Value Ref Range Status   Urine Culture, Routine Final report  Final   Organism ID, Bacteria No growth  Final  Microscopic Examination     Status: Abnormal   Collection Time: 01/18/21  4:27 PM   Urine  Result Value Ref Range Status   WBC, UA 0-5 0 - 5 /hpf Final   RBC >30 (A) 0 - 2 /hpf Final   Epithelial Cells (non renal) 0-10 0 - 10 /hpf Final   Renal Epithel, UA None seen None seen /hpf Final   Bacteria, UA Moderate (A) None seen/Few Final     Radiological Exams on Admission: No results found.  Assessment/Plan: Principal Problem:   Acute hypokalemia Active Problems:   Elevated blood pressure reading in office with diagnosis of hypertension   Difficulty voiding   Bladder tumor   Prostate cancer ([place])   Diarrhea    This patient was discussed with the ED physician, including pertinent vitals, physical exam findings, labs, and imaging.  We also discussed care given by the ED provider.  1. Acute hypokalemia a. Observation b. Check magnesium c. Replace potassium both p.o. and IV d. Check potassium in the morning 2. Difficulty voiding with history of prostate cancer and bladder tumor a. Possible UTI b. Urine culture pending c. Start on Rocephin in the interim d. Pain control for dysuria 3. Diarrhea a. Uncertain etiology b. No recent antibiotics to suggest C. difficile c. Does not appear to be infectious in etiology d. Patient did run out of his pain medicine, which could be contributory e. Start Lomotil 4. Elevated blood pressure a. Elevated here b. We will start antihypertensive treatment  DVT prophylaxis: SCDs as patient has gross  hematuria Consultants: None Code Status: Full code Family Communication: None Disposition Plan: Patient should be able to return home following admission   Truett Mainland, DO

## 2021-01-22 DIAGNOSIS — N390 Urinary tract infection, site not specified: Secondary | ICD-10-CM | POA: Diagnosis present

## 2021-01-22 DIAGNOSIS — I10 Essential (primary) hypertension: Secondary | ICD-10-CM | POA: Diagnosis present

## 2021-01-22 DIAGNOSIS — Z87440 Personal history of urinary (tract) infections: Secondary | ICD-10-CM | POA: Diagnosis not present

## 2021-01-22 DIAGNOSIS — Z806 Family history of leukemia: Secondary | ICD-10-CM | POA: Diagnosis not present

## 2021-01-22 DIAGNOSIS — Z8249 Family history of ischemic heart disease and other diseases of the circulatory system: Secondary | ICD-10-CM | POA: Diagnosis not present

## 2021-01-22 DIAGNOSIS — D630 Anemia in neoplastic disease: Secondary | ICD-10-CM | POA: Diagnosis present

## 2021-01-22 DIAGNOSIS — R310 Gross hematuria: Secondary | ICD-10-CM | POA: Diagnosis present

## 2021-01-22 DIAGNOSIS — Z20822 Contact with and (suspected) exposure to covid-19: Secondary | ICD-10-CM | POA: Diagnosis present

## 2021-01-22 DIAGNOSIS — E876 Hypokalemia: Secondary | ICD-10-CM | POA: Diagnosis present

## 2021-01-22 DIAGNOSIS — E860 Dehydration: Secondary | ICD-10-CM | POA: Diagnosis present

## 2021-01-22 DIAGNOSIS — D72819 Decreased white blood cell count, unspecified: Secondary | ICD-10-CM | POA: Diagnosis present

## 2021-01-22 DIAGNOSIS — C61 Malignant neoplasm of prostate: Secondary | ICD-10-CM | POA: Diagnosis present

## 2021-01-22 DIAGNOSIS — N138 Other obstructive and reflux uropathy: Secondary | ICD-10-CM | POA: Diagnosis present

## 2021-01-22 DIAGNOSIS — Z87891 Personal history of nicotine dependence: Secondary | ICD-10-CM | POA: Diagnosis not present

## 2021-01-22 DIAGNOSIS — Z8551 Personal history of malignant neoplasm of bladder: Secondary | ICD-10-CM | POA: Diagnosis not present

## 2021-01-22 DIAGNOSIS — Z79899 Other long term (current) drug therapy: Secondary | ICD-10-CM | POA: Diagnosis not present

## 2021-01-22 DIAGNOSIS — A419 Sepsis, unspecified organism: Secondary | ICD-10-CM

## 2021-01-22 DIAGNOSIS — R197 Diarrhea, unspecified: Secondary | ICD-10-CM | POA: Diagnosis present

## 2021-01-22 DIAGNOSIS — N179 Acute kidney failure, unspecified: Secondary | ICD-10-CM | POA: Diagnosis present

## 2021-01-22 LAB — CBC
HCT: 30.7 % — ABNORMAL LOW (ref 39.0–52.0)
Hemoglobin: 9.8 g/dL — ABNORMAL LOW (ref 13.0–17.0)
MCH: 26.9 pg (ref 26.0–34.0)
MCHC: 31.9 g/dL (ref 30.0–36.0)
MCV: 84.3 fL (ref 80.0–100.0)
Platelets: 263 10*3/uL (ref 150–400)
RBC: 3.64 MIL/uL — ABNORMAL LOW (ref 4.22–5.81)
RDW: 17.1 % — ABNORMAL HIGH (ref 11.5–15.5)
WBC: 3.9 10*3/uL — ABNORMAL LOW (ref 4.0–10.5)
nRBC: 0 % (ref 0.0–0.2)

## 2021-01-22 LAB — BASIC METABOLIC PANEL
Anion gap: 9 (ref 5–15)
BUN: 8 mg/dL (ref 8–23)
CO2: 20 mmol/L — ABNORMAL LOW (ref 22–32)
Calcium: 8.8 mg/dL — ABNORMAL LOW (ref 8.9–10.3)
Chloride: 111 mmol/L (ref 98–111)
Creatinine, Ser: 1.35 mg/dL — ABNORMAL HIGH (ref 0.61–1.24)
GFR, Estimated: 57 mL/min — ABNORMAL LOW (ref >60)
Glucose, Bld: 92 mg/dL (ref 70–99)
Potassium: 3.8 mmol/L (ref 3.5–5.1)
Sodium: 140 mmol/L (ref 135–145)

## 2021-01-22 LAB — SARS CORONAVIRUS 2 (TAT 6-24 HRS): SARS Coronavirus 2: NEGATIVE

## 2021-01-22 MED ORDER — OXYCODONE-ACETAMINOPHEN 5-325 MG PO TABS
2.0000 | ORAL_TABLET | ORAL | Status: DC | PRN
Start: 2021-01-22 — End: 2021-01-23
  Administered 2021-01-22 – 2021-01-23 (×3): 2 via ORAL
  Filled 2021-01-22 (×3): qty 2

## 2021-01-22 MED ORDER — AMLODIPINE BESYLATE 5 MG PO TABS: 10.0000 mg | ORAL_TABLET | Freq: Every day | ORAL | Status: DC

## 2021-01-22 MED ORDER — AMLODIPINE BESYLATE 5 MG PO TABS
5.0000 mg | ORAL_TABLET | Freq: Once | ORAL | Status: AC
  Administered 2021-01-22: 5 mg via ORAL
  Filled 2021-01-22: qty 1

## 2021-01-22 MED ORDER — PHENAZOPYRIDINE HCL 100 MG PO TABS
200.0000 mg | ORAL_TABLET | Freq: Three times a day (TID) | ORAL | Status: DC
  Administered 2021-01-22 – 2021-01-23 (×3): 200 mg via ORAL
  Filled 2021-01-22 (×3): qty 2

## 2021-01-22 MED ORDER — AMLODIPINE BESYLATE 5 MG PO TABS
10.0000 mg | ORAL_TABLET | Freq: Every day | ORAL | Status: DC
  Administered 2021-01-23: 10 mg via ORAL
  Filled 2021-01-22: qty 2

## 2021-01-22 MED ORDER — TAMSULOSIN HCL 0.4 MG PO CAPS
0.4000 mg | ORAL_CAPSULE | Freq: Every day | ORAL | Status: DC
  Administered 2021-01-22 – 2021-01-23 (×2): 0.4 mg via ORAL
  Filled 2021-01-22 (×2): qty 1

## 2021-01-22 MED ORDER — TRAMADOL HCL 50 MG PO TABS: 50.0000 mg | ORAL_TABLET | Freq: Once | ORAL | Status: DC

## 2021-01-22 MED ORDER — NITROFURANTOIN MONOHYD MACRO 100 MG PO CAPS
100.0000 mg | ORAL_CAPSULE | Freq: Two times a day (BID) | ORAL | Status: DC
  Administered 2021-01-22 – 2021-01-23 (×3): 100 mg via ORAL
  Filled 2021-01-22 (×3): qty 1

## 2021-01-22 MED ORDER — SODIUM CHLORIDE 0.9 % IV SOLN
2.0000 g | Freq: Three times a day (TID) | INTRAVENOUS | Status: DC
  Administered 2021-01-22 – 2021-01-23 (×4): 2 g via INTRAVENOUS
  Filled 2021-01-22 (×4): qty 2

## 2021-01-22 MED ORDER — MORPHINE SULFATE (PF) 2 MG/ML IV SOLN
2.0000 mg | Freq: Once | INTRAVENOUS | Status: AC
Start: 2021-01-22 — End: 2021-01-22
  Administered 2021-01-22: 2 mg via INTRAVENOUS
  Filled 2021-01-22: qty 1

## 2021-01-22 MED ORDER — TRAZODONE HCL 50 MG PO TABS
100.0000 mg | ORAL_TABLET | Freq: Every evening | ORAL | Status: DC | PRN
  Filled 2021-01-22 (×2): qty 2

## 2021-01-22 NOTE — Progress Notes (Signed)
RN notified by Central monitoring that patient HR went up to 153. Monitor reviewed. + sinus Vtach, patient denies any chestpain, just the pain when voiding despite po pain med given. VS checked. MD made aware. IV pain med given and will continue to monitor as ordered.

## 2021-01-22 NOTE — Progress Notes (Signed)
Patient Demographics:    Tony Powell, is a 70 y.o. male, DOB - 11-23-1950, QVZ:[id number]  Admit date - 01/21/2021   Admitting Physician Stachia Slutsky Denton Brick, MD  Outpatient Primary MD for the patient is Erven Colla, DO  LOS - 0   Chief Complaint  Patient presents with  . Urinary Frequency        Subjective:    [name] today has no fevers, no emesis,  No chest pain,  -Dysuria persist -No flank pain  Assessment  & Plan :    Principal Problem:    Pseudomonas and Enterococcus faecalis sepsis secondary to catheter associated UTI- Active Problems:   Acute hypokalemia   Elevated blood pressure reading in office with diagnosis of hypertension   Difficulty voiding   Bladder tumor   Prostate cancer ([place])   Diarrhea  Brief Summary:- 70 y.o. 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 twice daily for 1 week starting on 12/10/2020--- continue to be symptomatic--now being admitted for multidrug-resistant UTI having failed outpatient oral antibiotics  A/p 1) Pseudomonas and Enterococcus faecalis sepsis secondary to catheter associated UTI----urine culture from 12/02/2020 with Pseudomonas and Enterococcus faecalis -Failed outpatient Cipro orally -Patient met sepsis criteria with leukopenia, tachycardia and tachypnea -Treat empirically with IV cefepime to cover for Pseudomonas and nitrofurantoin to cover for Enterococcus pending repeat cultures --  2)AKI----acute kidney injury--due to UTI and dehydration -Creatinine has improved to 1.25 from 1.50 on admission, -Baseline creatinine was 1.1 on 09/01/2020 - renally adjust medications, avoid  nephrotoxic agents / dehydration  / hypotension   3)History of Prostate Cancer/bladder malignancy with urinary retention--- Foley removed on 01/17/2021 -Continue finasteride and Flomax -Outpatient follow-up with Dr. Noah Delaine advised  4)Diarrhea--- check stool for C. Difficile, symptomatic management otherwise  5) hypokalemia--due to diarrhea/GI losses, replace and recheck  6)HTN-amlodipine as ordered  7) chronic anemia--- suspect underlying neoplasm related -Anticipate dropping Hgb due to hemodilution with IV fluid  Disposition/Need for in-Hospital Stay- patient unable to be discharged at this time due to -sepsis secondary to Pseudomonas and Enterococcus faecalis UTI--failed outpatient oral antibiotics, and requires IV antibiotics, AKI requiring IV fluids*  Status is: Inpatient  Remains inpatient appropriate because:Please see above   Disposition: The patient is from: Home              Anticipated d/c is to: Home              Anticipated d/c date is: 2 days              Patient currently is not medically stable to d/c. Barriers: Not Clinically Stable-   Code Status :  -  Code Status: Full Code   Family Communication:    NA (patient is alert, awake and coherent)   Consults  :    DVT Prophylaxis  :   - SCDs  SCDs Start: 01/21/21 1852    Lab Results  Component Value Date   PLT 263 01/22/2021    Inpatient Medications  Scheduled Meds: . [START ON  01/23/2021] amLODipine  10 mg Oral Daily  . finasteride  5 mg Oral Daily  . megestrol  20 mg Oral BID  . nitrofurantoin (macrocrystal-monohydrate)  100 mg Oral Q12H  . potassium chloride  20 mEq Oral BID  . tamsulosin  0.4 mg Oral Daily   Continuous Infusions: . sodium chloride 75 mL/hr at 01/22/21 1222  . ceFEPime (MAXIPIME) IV 2 g (01/22/21 1229)   PRN Meds:.loperamide, ondansetron **OR** ondansetron (ZOFRAN) IV, oxyCODONE-acetaminophen    Anti-infectives (From admission, onward)   Start     Dose/Rate Route Frequency  Ordered Stop   01/22/21 1600  cefTRIAXone (ROCEPHIN) 1 g in sodium chloride 0.9 % 100 mL IVPB  Status:  Discontinued        1 g 200 mL/hr over 30 Minutes Intravenous Every 24 hours 01/21/21 1851 01/22/21 0951   01/22/21 1400  ceFEPIme (MAXIPIME) 2 g in sodium chloride 0.9 % 100 mL IVPB        2 g 200 mL/hr over 30 Minutes Intravenous Every 8 hours 01/22/21 0951     01/22/21 1045  nitrofurantoin (macrocrystal-monohydrate) (MACROBID) capsule 100 mg        100 mg Oral Every 12 hours 01/22/21 0951     01/21/21 1851  Urogesic-Blue 81.6 MG TABS 81.6 mg  Status:  Discontinued        81.6 mg Oral 2 times daily PRN 01/21/21 1851 01/21/21 1854   01/21/21 1500  cefTRIAXone (ROCEPHIN) 1 g in sodium chloride 0.9 % 100 mL IVPB        1 g 200 mL/hr over 30 Minutes Intravenous  Once 01/21/21 1453 01/21/21 1818        Objective:   Vitals:   01/21/21 2050 01/22/21 0111 01/22/21 0529 01/22/21 1350  BP: (!) 148/68 (!) 172/68 (!) 180/78 (!) 147/77  Pulse: 88 88 90 (!) 101  Resp: _0 Temp:  98.3 F (36.8 C) 97.7 F (36.5 C) (!) 97.4 F (36.3 C)  TempSrc:  Oral Oral Oral  SpO2: 96% 99% 99% 100%  Weight:      Height:        Wt Readings from Last 3 Encounters:  01/21/21 107.8 kg  12/31/20 110.8 kg  10/28/20 108.9 kg     Intake/Output Summary (Last 24 hours) at 01/22/2021 1612 Last data filed at 01/22/2021 1600 Gross per 24 hour  Intake 1500 ml  Output 2400 ml  Net -900 ml   Physical Exam  Gen:- Awake Alert,  In no apparent distress  HEENT:- [place].AT, No sclera icterus Neck-Supple Neck,No JVD,.  Lungs-  CTAB , fair symmetrical air movement CV- S1, S2 normal, regular  Abd-  +ve B.Sounds, Abd Soft, No abdominal tenderness, No CVA tenderness  Extremity/Skin:- No  edema, pedal pulses present  Psych-affect is appropriate, oriented x3 Neuro-no new focal deficits, no tremors   Data Review:   Micro Results Recent Results (from the past 240 hour(s))  Urine Culture     Status: None    Collection Time: 01/18/21  4:27 PM   Specimen: Urine   Urine  Result Value Ref Range Status   Urine Culture, Routine Final report  Final   Organism ID, Bacteria No growth  Final  Microscopic Examination     Status: Abnormal   Collection Time: 01/18/21  4:27 PM   Urine  Result Value Ref Range Status   WBC, UA 0-5 0 - 5 /hpf Final   RBC >30 (A) 0 - 2 /hpf Final  Epithelial Cells (non renal) 0-10 0 - 10 /hpf Final   Renal Epithel, UA None seen None seen /hpf Final   Bacteria, UA Moderate (A) None seen/Few Final  SARS CORONAVIRUS 2 (TAT 6-24 HRS) Nasopharyngeal Nasopharyngeal Swab     Status: None   Collection Time: 01/21/21  5:18 PM   Specimen: Nasopharyngeal Swab  Result Value Ref Range Status   SARS Coronavirus 2 NEGATIVE NEGATIVE Final    Comment: (NOTE) SARS-CoV-2 target nucleic acids are NOT DETECTED.  The SARS-CoV-2 RNA is generally detectable in upper and lower respiratory specimens during the acute phase of infection. Negative results do not preclude SARS-CoV-2 infection, do not rule out co-infections with other pathogens, and should not be used as the sole basis for treatment or other patient management decisions. Negative results must be combined with clinical observations, patient history, and epidemiological information. The expected result is Negative.  Fact Sheet for Patients: SugarRoll.be  Fact Sheet for Healthcare Providers: https://www.woods-mathews.com/  This test is not yet approved or cleared by the [place] FDA and  has been authorized for detection and/or diagnosis of SARS-CoV-2 by FDA under an Emergency Use Authorization (EUA). This EUA will remain  in effect (meaning this test can be used) for the duration of the COVID-19 declaration under Se ction 564(b)(1) of the Act, 21 U.S.C. section 360bbb-3(b)(1), unless the authorization is terminated or revoked sooner.  Performed at [facility], [place] [address], [place], [place] [address]     Radiology Reports No results found.   CBC Recent Labs  Lab 01/21/21 1326 01/22/21 0506  WBC 5.2 3.9*  HGB 10.2* 9.8*  HCT 32.0* 30.7*  PLT 271 263  MCV 83.8 84.3  MCH 26.7 26.9  MCHC 31.9 31.9  RDW 16.8* 17.1*  LYMPHSABS 0.7  --   MONOABS 0.5  --   EOSABS 0.1  --   BASOSABS 0.0  --     Chemistries  Recent Labs  Lab 01/21/21 1326 01/22/21 0506  NA 137 140  K 2.5* 3.8  CL 103 111  CO2 22 20*  GLUCOSE 106* 92  BUN 10 8  CREATININE 1.50* 1.35*  CALCIUM 8.8* 8.8*  MG 1.7  --    ------------------------------------------------------------------------------------------------------------------ No results for input(s): CHOL, HDL, LDLCALC, TRIG, CHOLHDL, LDLDIRECT in the last 72 hours.  No results found for: HGBA1C ------------------------------------------------------------------------------------------------------------------ No results for input(s): TSH, T4TOTAL, T3FREE, THYROIDAB in the last 72 hours.  Invalid input(s): FREET3 ------------------------------------------------------------------------------------------------------------------ No results for input(s): VITAMINB12, FOLATE, FERRITIN, TIBC, IRON, RETICCTPCT in the last 72 hours.  Coagulation profile No results for input(s): INR, PROTIME in the last 168 hours.  No results for input(s): DDIMER in the last 72 hours.  Cardiac Enzymes No results for input(s): CKMB, TROPONINI, MYOGLOBIN in the last 168 hours.  Invalid input(s): CK ------------------------------------------------------------------------------------------------------------------ No results found for: BNP   Roxan Hockey M.D on 01/22/2021 at 4:12 PM  Go to [email or web address] - for contact info  [facility] - Office  [phone]

## 2021-01-23 DIAGNOSIS — E876 Hypokalemia: Secondary | ICD-10-CM

## 2021-01-23 LAB — URINE CULTURE: Culture: <10000 — AB

## 2021-01-23 MED ORDER — LACTULOSE 10 GM/15ML PO SOLN
30.0000 g | Freq: Once | ORAL | Status: AC
  Administered 2021-01-23: 30 g via ORAL
  Filled 2021-01-23: qty 60

## 2021-01-23 MED ORDER — OXYCODONE-ACETAMINOPHEN 5-325 MG PO TABS: 1.0000 | ORAL_TABLET | Freq: Three times a day (TID) | ORAL | 0 refills | Status: DC | PRN

## 2021-01-23 MED ORDER — LABETALOL HCL 5 MG/ML IV SOLN: 10.0000 mg | INTRAVENOUS | Status: DC | PRN

## 2021-01-23 MED ORDER — TAMSULOSIN HCL 0.4 MG PO CAPS: 0.4000 mg | ORAL_CAPSULE | Freq: Every day | ORAL | 2 refills | Status: DC

## 2021-01-23 MED ORDER — AMLODIPINE BESYLATE 10 MG PO TABS: 10.0000 mg | ORAL_TABLET | Freq: Every day | ORAL | 5 refills | Status: DC

## 2021-01-23 MED ORDER — POLYETHYLENE GLYCOL 3350 17 G PO PACK
17.0000 g | PACK | Freq: Two times a day (BID) | ORAL | Status: DC
  Filled 2021-01-23: qty 1

## 2021-01-23 NOTE — Discharge Instructions (Signed)
1)Avoid ibuprofen/Advil/Aleve/Motrin/Goody Powders/Naproxen/BC powders/Meloxicam/Diclofenac/Indomethacin and other Nonsteroidal anti-inflammatory medications as these will make you more likely to bleed and can cause stomach ulcers, can also cause Kidney problems.   2)Please follow-up with Urologist Dr. Alyson Ingles in about 1 week--- for recheck and follow-up evaluation  in his office----[facility], [address] [place] [address], [place] [address] Phone Number----[phone]  3)Repeat BMP and CBC test within 1 week--

## 2021-01-23 NOTE — Discharge Summary (Addendum)
Tony Powell, is a 70 y.o. male  DOB February 05, 1951  MRN [id number].  Admission date:  01/21/2021  Admitting Physician  Brytni Dray Denton Brick, MD  Discharge Date:  01/23/2021   Primary MD  Erven Colla, DO  Recommendations for primary care physician for things to follow:   1)Avoid ibuprofen/Advil/Aleve/Motrin/Goody Powders/Naproxen/BC powders/Meloxicam/Diclofenac/Indomethacin and other Nonsteroidal anti-inflammatory medications as these will make you more likely to bleed and can cause stomach ulcers, can also cause Kidney problems.   2)Please follow-up with Urologist Dr. Alyson Ingles in about 1 week--- for recheck and follow-up evaluation  in his office----[facility], [address], [place] [address] Phone Number----[phone]  3)Repeat BMP and CBC test within 1 week  Admission Diagnosis  Hypokalemia [E87.6] Acute hypokalemia [E87.6] Acute cystitis without hematuria [N30.00] Diarrhea, unspecified type [R19.7] UTI (urinary tract infection) [N39.0]  Discharge Diagnosis  Hypokalemia [E87.6] Acute hypokalemia [E87.6] Acute cystitis without hematuria [N30.00] Diarrhea, unspecified type [R19.7] UTI (urinary tract infection) [N39.0]    Principal Problem:   Acute hypokalemia Active Problems:   Recent Pseudomonas and Enterococcus faecalis catheter associated UTI-   Elevated blood pressure reading in office with diagnosis of hypertension   Difficulty voiding   Bladder tumor   Prostate cancer ([place])   Diarrhea     Past Medical History:  Diagnosis Date  . Abnormal stress test   . Borderline hypertension   . Chest discomfort    Atypical  . DJD (degenerative joint disease)    Knees, lumbar sacral spine  . Hyperlipidemia   . Hypertension   . Prostate cancer [facility])     Past Surgical History:  Procedure Laterality Date  . BACK SURGERY    . TRANSURETHRAL RESECTION OF BLADDER  TUMOR N/A 08/25/2020   Procedure: TRANSURETHRAL RESECTION OF BLADDER TUMOR (TURBT);  Surgeon: Cleon Gustin, MD;  Location: [facility];  Service: Urology;  Laterality: N/A;     HPI  from the history and physical done on the day of admission:    Chief Complaint: pain with urinating  HPI: Tony Powell is a 70 y.o. 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  Since then, the patient has had significant urinary frequency and dysuria.  He often has a feeling of incomplete bladder emptying and sharp pains in his urethra that occurred frequently throughout the day.  Pain medicine helps with the pain.  He has been taking oxycodone every 4-6 hours.  A few days ago, he had a urinalysis suggestive of UTI with a urine culture which was negative.  Additionally, he has had a lot of watery diarrhea with multiple episodes a day.  No palliating or provoking factors.  Denies fevers, chills, nausea, vomiting.  Emergency Department Course: UTI with nitrites.  Potassium 2.5.  White count 5.2.  Patient started on Rocephin, started on potassium.    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 twice daily for 1 week starting on 12/10/2020--- continue to be symptomatic--now being admitted for multidrug-resistant UTI having failed outpatient oral antibiotics  A/p 1)Recent Pseudomonas and Enterococcus Faecalis CAUTI ----urine culture from 12/02/2020 with Pseudomonas and Enterococcus faecalis -was Treated with Cipro orally as outpatient -Sepsis with suspected on admission as  pt met SIRs criteria with leukopenia, tachycardia and tachypnea -Treated with IV cefepime to cover for Pseudomonas and nitrofurantoin to cover for Enterococcus  -However urine cultures without growth-- -no further antibiotics needed -patient does have a history of prostate and bladder malignancy with recurrent hematuria which may explain abnormal urine analysis -Symptomatically patient is improved no further antibiotics needed -In retrospect patient did not rule in for sepsis  2)AKI----acute kidney injury--due to  dehydration -Creatinine has improved to 1.35 from 1.50 on admission, -Baseline creatinine was 1.1 on 09/01/2020 - renally adjust medications, avoid nephrotoxic agents / dehydration  / hypotension  3)History of Prostate Cancer/Bladder Malignancy with urinary retention --- Foley removed on 01/17/2021 -Continue finasteride and Flomax -Outpatient follow-up with Dr. Noah Delaine advised  4)Diarrhea---  resolved, symptomatic management otherwise  5)Hypokalemia--due to diarrhea/GI losses, replaced  -Repeat BMP as outpatient  6)HTN-amlodipine   7) chronic anemia--- suspect underlying neoplasm related -Repeat CBC as outpatient with PCP  Disposition--- Home  Disposition: The patient is from: Home  Anticipated d/c is to: Home  Code Status :  -  Code Status: Full Code   Family Communication:    NA (patient is alert, awake and coherent)   Consults  :    DVT Prophylaxis  :   - SCDs  SCDs Start: 01/21/21 1852   Discharge Condition: Stable  Follow UP   Follow-up Information    Cleon Gustin, MD. Schedule an appointment as soon as possible for a visit in 1 week(s).   Specialty: Urology Why: Repeat BMP and CBC- Contact information: [address] [place] [address] [phone]               Diet and Activity recommendation:  As advised  Discharge Instructions    Discharge Instructions    Call MD for:  difficulty  breathing, headache or visual disturbances   Complete by: As directed    Call MD for:  persistant dizziness or light-headedness   Complete by: As directed    Call MD for:  persistant nausea and vomiting   Complete by: As directed    Call MD for:  severe uncontrolled pain   Complete by: As directed    Call MD for:  temperature >100.4   Complete by: As directed    Diet - low sodium heart healthy   Complete by: As directed    Discharge instructions   Complete by: As directed    1)Avoid ibuprofen/Advil/Aleve/Motrin/Goody Powders/Naproxen/BC powders/Meloxicam/Diclofenac/Indomethacin and other Nonsteroidal anti-inflammatory medications as these will make you more likely to bleed and can cause stomach ulcers, can also cause Kidney problems.   2)Please follow-up with Urologist Dr. Alyson Ingles in about 1 week--- for recheck and follow-up evaluation  in his office----[facility] [place], [address], [place] [address] Phone Number----[phone]  3)Repeat BMP and CBC test within 1 week   Increase activity slowly   Complete by: As directed  Discharge Medications     Allergies as of 01/23/2021      Reactions   Celebrex [celecoxib]    Eyes drooping and messed up skin on hands      Medication List    STOP taking these medications   ciprofloxacin 250 MG tablet Commonly known as: Cipro   naproxen sodium 220 MG tablet Commonly known as: ALEVE     TAKE these medications   amLODipine 10 MG tablet Commonly known as: NORVASC Take 1 tablet (10 mg total) by mouth daily. Start taking on: January 24, 2021 What changed:   medication strength  how much to take   finasteride 5 MG tablet Commonly known as: PROSCAR Take 5 mg by mouth daily.   megestrol 20 MG tablet Commonly known as: MEGACE Take 1 tablet (20 mg total) by mouth 2 (two) times daily. As needed. What changed:   when to take this  reasons to take this  additional instructions  Another  medication with the same name was removed. Continue taking this medication, and follow the directions you see here.   ondansetron 4 MG disintegrating tablet Commonly known as: Zofran ODT Take 1 tablet (4 mg total) by mouth every 8 (eight) hours as needed for nausea or vomiting.   oxyCODONE-acetaminophen 5-325 MG tablet Commonly known as: Percocet Take 1 tablet by mouth every 8 (eight) hours as needed for moderate pain or severe pain. What changed: when to take this   tamsulosin 0.4 MG Caps capsule Commonly known as: FLOMAX Take 1 capsule (0.4 mg total) by mouth daily. Start taking on: January 24, 2021   Uribel 118 MG Caps Take 1 capsule (118 mg total) by mouth 2 (two) times daily as needed.   Urogesic-Blue 81.6 MG Tabs Take 1 tablet (81.6 mg total) by mouth 2 (two) times daily as needed.      Major procedures and Radiology Reports - PLEASE review detailed and final reports for all details, in brief -  No results found.  Micro Results   Recent Results (from the past 240 hour(s))  Urine Culture     Status: None   Collection Time: 01/18/21  4:27 PM   Specimen: Urine   Urine  Result Value Ref Range Status   Urine Culture, Routine Final report  Final   Organism ID, Bacteria No growth  Final  Microscopic Examination     Status: Abnormal   Collection Time: 01/18/21  4:27 PM   Urine  Result Value Ref Range Status   WBC, UA 0-5 0 - 5 /hpf Final   RBC >30 (A) 0 - 2 /hpf Final   Epithelial Cells (non renal) 0-10 0 - 10 /hpf Final   Renal Epithel, UA None seen None seen /hpf Final   Bacteria, UA Moderate (A) None seen/Few Final  Urine Culture     Status: Abnormal   Collection Time: 01/21/21  1:17 PM   Specimen: Urine, Random  Result Value Ref Range Status   Specimen Description   Final    URINE, RANDOM Performed at [facility], [address], [place], [place] [address]    Special Requests   Final    NONE Performed at [facility], [address], [place], [place]  [address]    Culture (A)  Final    <10,000 COLONIES/mL INSIGNIFICANT GROWTH Performed at [facility], [place] [address] [place], [place] [address]    Report Status 01/23/2021 FINAL  Final  SARS CORONAVIRUS 2 (TAT 6-24 HRS) Nasopharyngeal Nasopharyngeal  Swab     Status: None   Collection Time: 01/21/21  5:18 PM   Specimen: Nasopharyngeal Swab  Result Value Ref Range Status   SARS Coronavirus 2 NEGATIVE NEGATIVE Final    Comment: (NOTE) SARS-CoV-2 target nucleic acids are NOT DETECTED.  The SARS-CoV-2 RNA is generally detectable in upper and lower respiratory specimens during the acute phase of infection. Negative results do not preclude SARS-CoV-2 infection, do not rule out co-infections with other pathogens, and should not be used as the sole basis for treatment or other patient management decisions. Negative results must be combined with clinical observations, patient history, and epidemiological information. The expected result is Negative.  Fact Sheet for Patients: SugarRoll.be  Fact Sheet for Healthcare Providers: https://www.woods-mathews.com/  This test is not yet approved or cleared by the [place] FDA and  has been authorized for detection and/or diagnosis of SARS-CoV-2 by FDA under an Emergency Use Authorization (EUA). This EUA will remain  in effect (meaning this test can be used) for the duration of the COVID-19 declaration under Se ction 564(b)(1) of the Act, 21 U.S.C. section 360bbb-3(b)(1), unless the authorization is terminated or revoked sooner.  Performed at [facility], [place] [address], [place], [place] [address]   Urine Culture     Status: None (Preliminary result)   Collection Time: 01/22/21 10:28 AM   Specimen: Urine, Clean Catch  Result Value Ref Range Status   Specimen Description   Final    URINE, CLEAN CATCH Performed at [facility], [address] [place], [place] [address]    Special  Requests   Final    NONE Performed at [facility], [place] [address] [place], [place] [address]    Culture PENDING  Incomplete   Report Status PENDING  Incomplete   Today   Subjective    Tony Powell today has no new complaints No fever  Or chills   No Nausea, Vomiting or Diarrhea -No further dysuria -No hematuria        Patient has been seen and examined prior to discharge   Objective   Blood pressure (!) 157/76, pulse 94, temperature 98.4 F (36.9 C), temperature source Oral, resp. rate 17, height 5' 10"  (1.778 m), weight 107.8 kg, SpO2 96 %.   Intake/Output Summary (Last 24 hours) at 01/23/2021 1533 Last data filed at 01/23/2021 1300 Gross per 24 hour  Intake 4014.41 ml  Output 2100 ml  Net 1914.41 ml    Exam Gen:- Awake Alert, no acute distress  HEENT:- .AT, No sclera icterus Neck-Supple Neck,No JVD,.  Lungs-  CTAB , good air movement bilaterally  CV- S1, S2 normal, regular Abd-  +ve B.Sounds, Abd Soft, No tenderness, no flank/CVA tenderness    Extremity/Skin:- No  edema,   good pulses Psych-affect is appropriate, oriented x3 Neuro-no new focal deficits, no tremors    Data Review   CBC w Diff:  Lab Results  Component Value Date   WBC 3.9 (L) 01/22/2021   HGB 9.8 (L) 01/22/2021   HGB 13.6 12/18/2018   HCT 30.7 (L) 01/22/2021   HCT 42.5 12/18/2018   PLT 263 01/22/2021   PLT 239 12/18/2018   LYMPHOPCT 12 01/21/2021   MONOPCT 10 01/21/2021   EOSPCT 1 01/21/2021   BASOPCT 0 01/21/2021    CMP:  Lab Results  Component Value Date   NA 140 01/22/2021   NA 140 12/30/2020   K 3.8 01/22/2021   CL 111 01/22/2021   CO2 20 (L)  01/22/2021   BUN 8 01/22/2021   BUN 15 12/30/2020   CREATININE 1.35 (H) 01/22/2021   PROT 6.8 12/30/2020   ALBUMIN 4.4 12/30/2020   BILITOT 0.3 12/30/2020   ALKPHOS 59 12/30/2020   AST 12 12/30/2020   ALT 10 12/30/2020  .   Total Discharge time is about 33 minutes  Roxan Hockey M.D on 01/23/2021 at 3:33 PM  Go  to [email or web address] -  for contact info  [facility] - Office  [phone]

## 2021-01-23 NOTE — Progress Notes (Signed)
Patient states understanding of discharge instructions, prescription given. 

## 2021-01-24 ENCOUNTER — Telehealth: Payer: Self-pay

## 2021-01-24 ENCOUNTER — Telehealth: Payer: Self-pay | Admitting: *Deleted

## 2021-01-24 LAB — URINE CULTURE: Culture: NO GROWTH

## 2021-01-24 NOTE — Telephone Encounter (Signed)
Pt called back wanting to go over his meds that were given, changed or stopped after emergency room visit. We went over them in the after visit summary. Pt expressed understanding when finished.

## 2021-01-24 NOTE — Telephone Encounter (Signed)
Pt called and notified Dr. Alyson Ingles said he would not refill his pain med.

## 2021-01-24 NOTE — Telephone Encounter (Signed)
Transition Care Management Follow-up Telephone Call  Date of discharge and from where: 01/23/2021 - [facility]  How have you been since you were released from the hospital? "Feeling better every day"  Any questions or concerns? No  Items Reviewed:  Did the pt receive and understand the discharge instructions provided? Yes   Medications obtained and verified? Yes   Other? No   Any new allergies since your discharge? No   Dietary orders reviewed? Yes  Do you have support at home? Yes   Home Care and Equipment/Supplies: Were home health services ordered? not applicable If so, what is the name of the agency? N/A  Has the agency set up a time to come to the patient's home? not applicable Were any new equipment or medical supplies ordered?  No What is the name of the medical supply agency? N/A Were you able to get the supplies/equipment? not applicable Do you have any questions related to the use of the equipment or supplies? No  Functional Questionnaire: (I = Independent and D = Dependent) ADLs: I  Bathing/Dressing- I  Meal Prep- I  Eating- I  Maintaining continence- I  Transferring/Ambulation- I  Managing Meds- I  Follow up appointments reviewed:   PCP Hospital f/u appt confirmed? Yes  Scheduled to see PCP on 02/17/2021 @ 0900.  [facility] f/u appt confirmed? Yes  Scheduled to see Urology on 01/28/2021 @ 0915.  Are transportation arrangements needed? No   If their condition worsens, is the pt aware to call PCP or go to the Emergency Dept.? Yes  Was the patient provided with contact information for the PCP's office or ED? Yes  Was to pt encouraged to call back with questions or concerns? Yes

## 2021-01-25 NOTE — Progress Notes (Signed)
Sent via mail 

## 2021-01-26 ENCOUNTER — Telehealth: Payer: Self-pay

## 2021-01-26 NOTE — Telephone Encounter (Signed)
Pt called asking to let Dr. Alyson Ingles know that he wanted his Prostate taken out. I spoke with Dr. Alyson Ingles and he stated pt could not have surgery due to having radiation in the past. Called pt back made him aware and he asked why. I explained dr said if he did that he would have worse problems. Pt said he could not have any pain pills and he was peeing every 5 minutes and could not deal with the pain. He would discuss with doctor on Friday at appointment.

## 2021-01-28 ENCOUNTER — Encounter: Payer: Self-pay | Admitting: Urology

## 2021-01-28 ENCOUNTER — Other Ambulatory Visit: Payer: Self-pay

## 2021-01-28 ENCOUNTER — Ambulatory Visit (INDEPENDENT_AMBULATORY_CARE_PROVIDER_SITE_OTHER): Payer: Medicare Other | Admitting: Urology

## 2021-01-28 VITALS — BP 137/89 | HR 98 | Temp 99.0°F | Ht 70.0 in | Wt 232.0 lb

## 2021-01-28 DIAGNOSIS — R300 Dysuria: Secondary | ICD-10-CM | POA: Diagnosis not present

## 2021-01-28 DIAGNOSIS — N3281 Overactive bladder: Secondary | ICD-10-CM

## 2021-01-28 DIAGNOSIS — C61 Malignant neoplasm of prostate: Secondary | ICD-10-CM

## 2021-01-28 LAB — URINALYSIS, ROUTINE W REFLEX MICROSCOPIC
Bilirubin, UA: NEGATIVE
Glucose, UA: NEGATIVE
Nitrite, UA: NEGATIVE
Specific Gravity, UA: 1.025 (ref 1.005–1.030)
Urobilinogen, Ur: 0.2 mg/dL (ref 0.2–1.0)
pH, UA: 6 (ref 5.0–7.5)

## 2021-01-28 LAB — MICROSCOPIC EXAMINATION
Bacteria, UA: NONE SEEN
RBC, Urine: >30 /hpf — AB (ref 0–2)
Renal Epithel, UA: NONE SEEN /hpf

## 2021-01-28 MED ORDER — GEMTESA 75 MG PO TABS: 1.0000 | ORAL_TABLET | Freq: Every day | ORAL | 0 refills | Status: DC

## 2021-01-28 MED ORDER — OXYCODONE-ACETAMINOPHEN 5-325 MG PO TABS: 1.0000 | ORAL_TABLET | ORAL | 0 refills | Status: DC | PRN

## 2021-01-28 NOTE — Progress Notes (Signed)
Urological Symptom Review  Patient is experiencing the following symptoms: Frequent urination Burning/pain with urination Get up at night to urinate Leakage of urine Have to strain to urinate Urinary tract infection   Review of Systems  Gastrointestinal (upper)  : Negative for upper GI symptoms  Gastrointestinal (lower) : Diarrhea  Constitutional : Night Sweats Weight loss Fatigue  Skin: Negative for skin symptoms  Eyes: Negative for eye symptoms  Ear/Nose/Throat : Negative for Ear/Nose/Throat symptoms  Hematologic/Lymphatic: Negative for Hematologic/Lymphatic symptoms  Cardiovascular : Negative for cardiovascular symptoms  Respiratory : Shortness of breath  Endocrine: Negative for endocrine symptoms  Musculoskeletal: Negative for musculoskeletal symptoms  Neurological: Negative for neurological symptoms  Psychologic: Negative for psychiatric symptoms

## 2021-01-28 NOTE — Progress Notes (Signed)
01/28/2021 9:32 AM   [name] 09-09-1951 [id number]  Referring provider: Erven Colla, DO [address],  [place] [address]  Followup dysuria and prostate cancer  HPI: Tony Powell is a 70yo here for followup for prostate cancer and dysuria. Since stopping IMRT he has noted diarrhea and urinary frequency and dysuria. He is urinating every 20-30 minutes. IPSS 20 with QOL 6. He has tried mirabegron and Lisbeth Ply which failed to improve his urinary frequency.    PMH: Past Medical History:  Diagnosis Date  . Abnormal stress test   . Borderline hypertension   . Chest discomfort    Atypical  . DJD (degenerative joint disease)    Knees, lumbar sacral spine  . Hyperlipidemia   . Hypertension   . Prostate cancer [facility])     Surgical History: Past Surgical History:  Procedure Laterality Date  . BACK SURGERY    . TRANSURETHRAL RESECTION OF BLADDER TUMOR N/A 08/25/2020   Procedure: TRANSURETHRAL RESECTION OF BLADDER TUMOR (TURBT);  Surgeon: Cleon Gustin, MD;  Location: [facility];  Service: Urology;  Laterality: N/A;    Home Medications:  Allergies as of 01/28/2021      Reactions   Celebrex [celecoxib]    Eyes drooping and messed up skin on hands      Medication List       Accurate as of January 28, 2021  9:32 AM. If you have any questions, ask your nurse or doctor.        amLODipine 10 MG tablet Commonly known as: NORVASC Take 1 tablet (10 mg total) by mouth daily.   finasteride 5 MG tablet Commonly known as: PROSCAR Take 5 mg by mouth daily.   megestrol 20 MG tablet Commonly known as: MEGACE Take 1 tablet (20 mg total) by mouth 2 (two) times daily. As needed. What changed:   when to take this  reasons to take this  additional instructions   ondansetron 4 MG disintegrating tablet Commonly known as: Zofran ODT Take 1 tablet (4 mg total) by mouth every 8 (eight) hours as needed for nausea or vomiting.   oxyCODONE-acetaminophen 5-325 MG  tablet Commonly known as: Percocet Take 1 tablet by mouth every 8 (eight) hours as needed for moderate pain or severe pain.   tamsulosin 0.4 MG Caps capsule Commonly known as: FLOMAX Take 1 capsule (0.4 mg total) by mouth daily.   Uribel 118 MG Caps Take 1 capsule (118 mg total) by mouth 2 (two) times daily as needed.   Urogesic-Blue 81.6 MG Tabs Take 1 tablet (81.6 mg total) by mouth 2 (two) times daily as needed.       Allergies:  Allergies  Allergen Reactions  . Celebrex [Celecoxib]     Eyes drooping and messed up skin on hands    Family History: Family History  Problem Relation Age of Onset  . Heart attack Mother   . Breast cancer Mother        Mastectomy  . Leukemia Father   . Cancer Brother   . Cancer Brother   . Cancer Brother   . Cancer Brother   . Cancer Brother   . Colon cancer Neg Hx   . Prostate cancer Neg Hx   . Pancreatic cancer Neg Hx     Social History:  reports that he quit smoking about 22 years ago. He has a 20.00 pack-year smoking history. He has never used smokeless tobacco. He reports that he does not drink alcohol and does  not use drugs.  ROS: All other review of systems were reviewed and are negative except what is noted above in HPI  Physical Exam: BP 137/89   Pulse 98   Temp 99 F (37.2 C)   Ht 5\' 10"  (1.778 m)   Wt 232 lb (105.2 kg)   BMI 33.29 kg/m   Constitutional:  Alert and oriented, No acute distress. HEENT: [place] AT, moist mucus membranes.  Trachea midline, no masses. Cardiovascular: No clubbing, cyanosis, or edema. Respiratory: Normal respiratory effort, no increased work of breathing. GI: Abdomen is soft, nontender, nondistended, no abdominal masses GU: No CVA tenderness.  Lymph: No cervical or inguinal lymphadenopathy. Skin: No rashes, bruises or suspicious lesions. Neurologic: Grossly intact, no focal deficits, moving all 4 extremities. Psychiatric: Normal mood and affect.  Laboratory Data: Lab Results  Component  Value Date   WBC 3.9 (L) 01/22/2021   HGB 9.8 (L) 01/22/2021   HCT 30.7 (L) 01/22/2021   MCV 84.3 01/22/2021   PLT 263 01/22/2021    Lab Results  Component Value Date   CREATININE 1.35 (H) 01/22/2021    No results found for: PSA  No results found for: TESTOSTERONE  No results found for: HGBA1C  Urinalysis    Component Value Date/Time   COLORURINE AMBER (A) 01/21/2021 1317   APPEARANCEUR CLEAR 01/21/2021 1317   APPEARANCEUR Cloudy (A) 01/18/2021 1627   LABSPEC 1.010 01/21/2021 1317   PHURINE 7.0 01/21/2021 1317   GLUCOSEU NEGATIVE 01/21/2021 1317   HGBUR LARGE (A) 01/21/2021 1317   BILIRUBINUR NEGATIVE 01/21/2021 1317   BILIRUBINUR Negative 01/18/2021 1627   KETONESUR NEGATIVE 01/21/2021 1317   PROTEINUR 100 (A) 01/21/2021 1317   NITRITE POSITIVE (A) 01/21/2021 1317   LEUKOCYTESUR TRACE (A) 01/21/2021 1317    Lab Results  Component Value Date   LABMICR See below: 01/18/2021   WBCUA 0-5 01/18/2021   LABEPIT 0-10 01/18/2021   BACTERIA NONE SEEN 01/21/2021    Pertinent Imaging:  No results found for this or any previous visit.  No results found for this or any previous visit.  No results found for this or any previous visit.  No results found for this or any previous visit.  No results found for this or any previous visit.  No results found for this or any previous visit.  No results found for this or any previous visit.  No results found for this or any previous visit.   Assessment & Plan:    1. Prostate cancer ([place]) -RTC 1 month for cystoscopy  2. Dysuria -likely related from IMRT. contineu pyridium prn  3. Urinary frequency and urgency -We will start gemtesa 75mg  daily   No follow-ups on file.  [name], MD  [facility] [place]

## 2021-02-02 DIAGNOSIS — R300 Dysuria: Secondary | ICD-10-CM | POA: Insufficient documentation

## 2021-02-02 DIAGNOSIS — N3281 Overactive bladder: Secondary | ICD-10-CM | POA: Insufficient documentation

## 2021-02-02 NOTE — Patient Instructions (Signed)
Prostate Cancer  The prostate is a male gland that helps make semen. It is located below a man's bladder, in front of the rectum. Prostate cancer is when abnormal cells grow in this gland. What are the causes? The cause of this condition is not known. What increases the risk? You are more likely to develop this condition if:  You are 70 years of age or older.  You are African American.  You have a family history of prostate cancer.  You have a family history of breast cancer. What are the signs or symptoms? Symptoms of this condition include:  A need to pee often.  Peeing that is weak, or pee that stops and starts.  Trouble starting or stopping your pee.  Inability to pee.  Blood in your pee or semen.  Pain in the lower back, lower belly (abdomen), hips, or upper thighs.  Trouble getting an erection.  Trouble emptying all of your pee. How is this treated? Treatment for this condition depends on your age, your health, the kind of treatment you like, and how far the cancer has spread. Treatments include:  Being watched. This is called observation. You will be tested from time to time, but you will not get treated. Tests are to make sure that the cancer is not growing.  Surgery. This may be done to remove the prostate, to remove the testicles, or to freeze or kill cancer cells.  Radiation. This uses a strong beam to kill cancer cells.  Ultrasound energy. This uses strong sound waves to kill cancer cells.  Chemotherapy. This uses medicines that stop cancer cells from increasing. This kills cancer cells and healthy cells.  Targeted therapy. This kills cancer cells only. Healthy cells are not affected.  Hormone treatment. This stops the body from making hormones that help the cancer cells to grow. Follow these instructions at home:  Take over-the-counter and prescription medicines only as told by your doctor.  Eat a healthy diet.  Get plenty of sleep.  Ask your  doctor for help to find a support group for men with prostate cancer.  If you have to go to the hospital, let your cancer doctor (oncologist) know.  Treatment may affect your ability to have sex. Touch, hold, hug, and caress your partner to have intimate moments.  Keep all follow-up visits as told by your doctor. This is important. Contact a doctor if:  You have new or more trouble peeing.  You have new or more blood in your pee.  You have new or more pain in your hips, back, or chest. Get help right away if:  You have weakness in your legs.  You lose feeling in your legs.  You cannot control your pee or your poop (stool).  You have chills or a fever. Summary  The prostate is a male gland that helps make semen.  Prostate cancer is when abnormal cells grow in this gland.  Treatment includes doing surgery, using medicines, using very strong beams, or watching without treatment.  Ask your doctor for help to find a support group for men with prostate cancer.  Contact a doctor if you have problems peeing or have any new pain that you did not have before. This information is not intended to replace advice given to you by your health care provider. Make sure you discuss any questions you have with your health care provider. Document Revised: 10/14/2019 Document Reviewed: 10/14/2019 Elsevier Patient Education  2021 Elsevier Inc.  

## 2021-02-08 ENCOUNTER — Telehealth: Payer: Self-pay

## 2021-02-09 ENCOUNTER — Emergency Department (HOSPITAL_COMMUNITY)
Admission: EM | Admit: 2021-02-09 | Discharge: 2021-02-09 | Disposition: A | Discharge status: Home / Self Care | Payer: Medicare Other | Attending: Emergency Medicine | Admitting: Emergency Medicine

## 2021-02-09 ENCOUNTER — Telehealth: Payer: Self-pay

## 2021-02-09 ENCOUNTER — Encounter (HOSPITAL_COMMUNITY): Payer: Self-pay

## 2021-02-09 ENCOUNTER — Other Ambulatory Visit: Payer: Self-pay

## 2021-02-09 DIAGNOSIS — R350 Frequency of micturition: Secondary | ICD-10-CM

## 2021-02-09 DIAGNOSIS — Z923 Personal history of irradiation: Secondary | ICD-10-CM | POA: Diagnosis not present

## 2021-02-09 DIAGNOSIS — Z87891 Personal history of nicotine dependence: Secondary | ICD-10-CM | POA: Insufficient documentation

## 2021-02-09 DIAGNOSIS — N390 Urinary tract infection, site not specified: Secondary | ICD-10-CM | POA: Diagnosis not present

## 2021-02-09 DIAGNOSIS — I10 Essential (primary) hypertension: Secondary | ICD-10-CM | POA: Insufficient documentation

## 2021-02-09 DIAGNOSIS — Z8551 Personal history of malignant neoplasm of bladder: Secondary | ICD-10-CM | POA: Diagnosis not present

## 2021-02-09 DIAGNOSIS — R300 Dysuria: Secondary | ICD-10-CM

## 2021-02-09 DIAGNOSIS — Z8546 Personal history of malignant neoplasm of prostate: Secondary | ICD-10-CM | POA: Diagnosis not present

## 2021-02-09 DIAGNOSIS — Z79899 Other long term (current) drug therapy: Secondary | ICD-10-CM | POA: Diagnosis not present

## 2021-02-09 LAB — COMPREHENSIVE METABOLIC PANEL
ALT: 13 U/L (ref 0–44)
AST: 19 U/L (ref 15–41)
Albumin: 3.5 g/dL (ref 3.5–5.0)
Alkaline Phosphatase: 57 U/L (ref 38–126)
Anion gap: 7 (ref 5–15)
BUN: 9 mg/dL (ref 8–23)
CO2: 22 mmol/L (ref 22–32)
Calcium: 8.5 mg/dL — ABNORMAL LOW (ref 8.9–10.3)
Chloride: 110 mmol/L (ref 98–111)
Creatinine, Ser: 1.15 mg/dL (ref 0.61–1.24)
GFR, Estimated: >60 mL/min (ref >60)
Glucose, Bld: 118 mg/dL — ABNORMAL HIGH (ref 70–99)
Potassium: 3.7 mmol/L (ref 3.5–5.1)
Sodium: 139 mmol/L (ref 135–145)
Total Bilirubin: 0.7 mg/dL (ref 0.3–1.2)
Total Protein: 6.8 g/dL (ref 6.5–8.1)

## 2021-02-09 LAB — LACTIC ACID, PLASMA: Lactic Acid, Venous: 1.7 mmol/L (ref 0.5–1.9)

## 2021-02-09 LAB — CBC WITH DIFFERENTIAL/PLATELET
Abs Immature Granulocytes: 0.02 10*3/uL (ref 0.00–0.07)
Basophils Absolute: 0 10*3/uL (ref 0.0–0.1)
Basophils Relative: 0 %
Eosinophils Absolute: 0.1 10*3/uL (ref 0.0–0.5)
Eosinophils Relative: 1 %
HCT: 30.8 % — ABNORMAL LOW (ref 39.0–52.0)
Hemoglobin: 9.7 g/dL — ABNORMAL LOW (ref 13.0–17.0)
Immature Granulocytes: 0 %
Lymphocytes Relative: 7 %
Lymphs Abs: 0.5 10*3/uL — ABNORMAL LOW (ref 0.7–4.0)
MCH: 27.2 pg (ref 26.0–34.0)
MCHC: 31.5 g/dL (ref 30.0–36.0)
MCV: 86.3 fL (ref 80.0–100.0)
Monocytes Absolute: 0.6 10*3/uL (ref 0.1–1.0)
Monocytes Relative: 10 %
Neutro Abs: 5.5 10*3/uL (ref 1.7–7.7)
Neutrophils Relative %: 82 %
Platelets: 252 10*3/uL (ref 150–400)
RBC: 3.57 MIL/uL — ABNORMAL LOW (ref 4.22–5.81)
RDW: 15.9 % — ABNORMAL HIGH (ref 11.5–15.5)
WBC: 6.7 10*3/uL (ref 4.0–10.5)
nRBC: 0 % (ref 0.0–0.2)

## 2021-02-09 LAB — URINALYSIS, ROUTINE W REFLEX MICROSCOPIC
Bilirubin Urine: NEGATIVE
Glucose, UA: NEGATIVE mg/dL
Ketones, ur: NEGATIVE mg/dL
Nitrite: NEGATIVE
Protein, ur: 100 mg/dL — AB
RBC / HPF: >50 RBC/hpf — ABNORMAL HIGH (ref 0–5)
Specific Gravity, Urine: 1.011 (ref 1.005–1.030)
WBC, UA: >50 WBC/hpf — ABNORMAL HIGH (ref 0–5)
pH: 6 (ref 5.0–8.0)

## 2021-02-09 MED ORDER — CEPHALEXIN 500 MG PO CAPS: 500.0000 mg | ORAL_CAPSULE | Freq: Four times a day (QID) | ORAL | 0 refills | Status: AC

## 2021-02-09 MED ORDER — CEPHALEXIN 500 MG PO CAPS
500.0000 mg | ORAL_CAPSULE | Freq: Once | ORAL | Status: AC
  Administered 2021-02-09: 500 mg via ORAL
  Filled 2021-02-09: qty 1

## 2021-02-09 NOTE — Telephone Encounter (Signed)
Pt called asking if Dr. Alyson Ingles was going to refill his pain meds. I spoke with Dr. Alyson Ingles and he said he wasn't going to refill. Pt notified. Pt stated he did not understand why and that he was at the ER and the doctor was going to call Dr. Alyson Ingles.

## 2021-02-09 NOTE — ED Provider Notes (Signed)
[facility] DEPT Provider Note   CSN: [id number] Arrival date & time: 02/09/21  1141     History Chief Complaint  Patient presents with  . Hematuria  . Polyuria  . Fever    Tony Powell is a 70 y.o. male with a history of prostate cancer ( s/p transurethral resection of the tumor on 08/25/2020; 8-week course of daily radiotherapy with concurrent with ADT completed 4 weeks prior per patient); urinary frequency, hypertension.  Patient presents today with chief complaint of urinary frequency, dysuria, and decreased urinary output.  Patient reports that his urinary frequency has been present since last March.  Patient believes that this has worsened over the last 2 to 3 days.  Patient reports that this urinary frequency is causing him to urinate every 15 to 20 minutes.   Patient also endorses dysuria reports this has been present since December 2021 when his radiation therapy began.  Patient denies any change in his dysuria over this time.  Patient also endorses hematuria stating he has been passing large blood clots.  Has been intermittent since his radiation therapy began in December.  Patient reports he believes they are larger over the last 2 to 3 days.  Patient also endorses suprapubic discomfort.  Rates his pain 8/10 on a pain scale.  Denies any aggravating factors.  Reports pain was better when he had prescribed oxycodone to take.  Reports that he finished oxycodone prescription and recently.  Per chart review patient was seen by neurologist Dr. Alyson Ingles on 01/28/2021 for complaints of urinary frequency and dysuria.  Patient was given prescription for gemtesa 75mg  daily to help with his urinary frequency and urgency.  Patient was advised to use Pyridium as needed for his dysuria.  Patient has cystoscopy planned with urology in 1 month.  Reports that he has been taking his medications as prescribed.    HPI     Past Medical History:  Diagnosis Date  .  Abnormal stress test   . Borderline hypertension   . Chest discomfort    Atypical  . DJD (degenerative joint disease)    Knees, lumbar sacral spine  . Hyperlipidemia   . Hypertension   . Prostate cancer [facility])     Patient Active Problem List   Diagnosis Date Noted  . Dysuria 02/02/2021  . OAB (overactive bladder) 02/02/2021  . Recent Pseudomonas and Enterococcus faecalis catheter associated UTI- 01/22/2021  . Acute hypokalemia 01/21/2021  . Diarrhea 01/21/2021  . Abnormal kidney function 12/31/2020  . Prostate cancer ([place]) 08/30/2020  . Bladder tumor 08/23/2020  . Gross hematuria 08/19/2020  . Difficulty voiding 08/13/2020  . 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/13/2020  . History of prostatitis 05/02/2020  . HNP (herniated nucleus pulposus), lumbar 04/16/2013  . HYPERLIPIDEMIA 05/04/2010  . OVERWEIGHT 05/04/2010  . Elevated blood pressure reading in office with diagnosis of hypertension 04/28/2010  . DEGENERATIVE JOINT DISEASE 04/28/2010  . CHEST PAIN 04/28/2010  . ABNORMAL STRESS ELECTROCARDIOGRAM 04/28/2010    Past Surgical History:  Procedure Laterality Date  . BACK SURGERY    . TRANSURETHRAL RESECTION OF BLADDER TUMOR N/A 08/25/2020   Procedure: TRANSURETHRAL RESECTION OF BLADDER TUMOR (TURBT);  Surgeon: Cleon Gustin, MD;  Location: [facility];  Service: Urology;  Laterality: N/A;       Family History  Problem Relation Age of Onset  . Heart attack Mother   . Breast cancer Mother        Mastectomy  . Leukemia Father   .  Cancer Brother   . Cancer Brother   . Cancer Brother   . Cancer Brother   . Cancer Brother   . Colon cancer Neg Hx   . Prostate cancer Neg Hx   . Pancreatic cancer Neg Hx     Social History   Tobacco Use  . Smoking status: Former Smoker    Packs/day: 1.00    Years: 20.00    Pack years: 20.00    Quit date: 11/13/1998    Years since quitting: 22.2  . Smokeless tobacco: Never Used  Substance Use Topics  .  Alcohol use: No  . Drug use: Never    Home Medications Prior to Admission medications   Medication Sig Start Date End Date Taking? Authorizing Provider  amLODipine (NORVASC) 10 MG tablet Take 1 tablet (10 mg total) by mouth daily. 01/24/21   Roxan Hockey, MD  finasteride (PROSCAR) 5 MG tablet Take 5 mg by mouth daily.  07/02/20   [provider]  megestrol (MEGACE) 20 MG tablet Take 1 tablet (20 mg total) by mouth 2 (two) times daily. As needed. Patient taking differently: Take 20 mg by mouth 2 (two) times daily as needed (urinary symptoms). 01/17/21   McKenzie, Candee Furbish, MD  Meth-Hyo-M Barnett Hatter Phos-Ph Sal (URIBEL) 118 MG CAPS Take 1 capsule (118 mg total) by mouth 2 (two) times daily as needed. 01/19/21   McKenzie, Candee Furbish, MD  Methen-Hyosc-Meth Blue-Na Phos (UROGESIC-BLUE) 81.6 MG TABS Take 1 tablet (81.6 mg total) by mouth 2 (two) times daily as needed. 01/19/21   McKenzie, Candee Furbish, MD  ondansetron (ZOFRAN ODT) 4 MG disintegrating tablet Take 1 tablet (4 mg total) by mouth every 8 (eight) hours as needed for nausea or vomiting. 08/26/20   McKenzie, Candee Furbish, MD  oxyCODONE-acetaminophen (PERCOCET) 5-325 MG tablet Take 1 tablet by mouth every 4 (four) hours as needed for moderate pain or severe pain. 01/28/21 01/28/22  Cleon Gustin, MD  tamsulosin (FLOMAX) 0.4 MG CAPS capsule Take 1 capsule (0.4 mg total) by mouth daily. 01/24/21   Roxan Hockey, MD  Vibegron (GEMTESA) 75 MG TABS Take 1 capsule by mouth daily. 01/28/21   McKenzie, Candee Furbish, MD    Allergies    Celebrex [celecoxib]  Review of Systems   Review of Systems  Constitutional: Negative for chills and fever.  Eyes: Negative for visual disturbance.  Respiratory: Negative for shortness of breath.   Cardiovascular: Negative for chest pain.  Gastrointestinal: Positive for constipation. Negative for abdominal distention, abdominal pain, anal bleeding, blood in stool, diarrhea, nausea, rectal pain and vomiting.   Genitourinary: Positive for dysuria, frequency, hematuria and urgency. Negative for decreased urine volume, difficulty urinating, flank pain, genital sores, penile discharge, penile pain, penile swelling, scrotal swelling and testicular pain.  Musculoskeletal: Negative for back pain and neck pain.  Skin: Negative for color change and rash.  Neurological: Negative for dizziness, syncope, light-headedness and headaches.  Psychiatric/Behavioral: Negative for confusion.    Physical Exam Updated Vital Signs BP 139/72   Pulse 92   Temp 98.3 F (36.8 C) (Oral)   Resp 14   SpO2 99%   Physical Exam Vitals and nursing note reviewed.  Constitutional:      General: He is not in acute distress.    Appearance: He is not ill-appearing, toxic-appearing or diaphoretic.  HENT:     Head: Normocephalic.  Eyes:     General: No scleral icterus.       Right eye: No discharge.  Left eye: No discharge.  Cardiovascular:     Rate and Rhythm: Normal rate.     Heart sounds: Normal heart sounds.  Pulmonary:     Effort: Pulmonary effort is normal. No respiratory distress.     Breath sounds: Normal breath sounds. No stridor.  Abdominal:     General: Abdomen is protuberant. Bowel sounds are normal. There is no distension. There are no signs of injury.     Palpations: Abdomen is soft. There is no mass or pulsatile mass.     Tenderness: There is no abdominal tenderness. There is no right CVA tenderness, left CVA tenderness, guarding or rebound.     Hernia: There is no hernia in the umbilical area or ventral area.  Musculoskeletal:     Cervical back: Neck supple.  Skin:    General: Skin is warm and dry.  Neurological:     General: No focal deficit present.     Mental Status: He is alert and oriented to person, place, and time.  Psychiatric:        Behavior: Behavior is cooperative.     ED Results / Procedures / Treatments   Labs (all labs ordered are listed, but only abnormal results are  displayed) Labs Reviewed  URINALYSIS, ROUTINE W REFLEX MICROSCOPIC - Abnormal; Notable for the following components:      Result Value   APPearance CLOUDY (*)    Hgb urine dipstick LARGE (*)    Protein, ur 100 (*)    Leukocytes,Ua LARGE (*)    RBC / HPF >50 (*)    WBC, UA >50 (*)    Bacteria, UA RARE (*)    All other components within normal limits  CBC WITH DIFFERENTIAL/PLATELET - Abnormal; Notable for the following components:   RBC 3.57 (*)    Hemoglobin 9.7 (*)    HCT 30.8 (*)    RDW 15.9 (*)    Lymphs Abs 0.5 (*)    All other components within normal limits  COMPREHENSIVE METABOLIC PANEL - Abnormal; Notable for the following components:   Glucose, Bld 118 (*)    Calcium 8.5 (*)    All other components within normal limits  URINE CULTURE  LACTIC ACID, PLASMA    EKG None  Radiology No results found.  Procedures Procedures   Medications Ordered in ED Medications  cephALEXin (KEFLEX) capsule 500 mg (500 mg Oral Given 02/09/21 1434)    ED Course  I have reviewed the triage vital signs and the nursing notes.  Pertinent labs & imaging results that were available during my care of the patient were reviewed by me and considered in my medical decision making (see chart for details).    MDM Rules/Calculators/A&P                          Alert 70 year old male no acute stress, nontoxic appearing.  Patient presents with chief planing of dysuria, urinary frequency, and hematuria.  Has had the symptoms for multiple months related to his prostate cancer and radiation treatment.  Patient reports that he has had worsening of his symptoms over the last 2 to 3 days.  Per chart review patient sees Dr. Alyson Ingles with [facility] urology.  Per chart review patient was seen by neurologist Dr. Alyson Ingles on 01/28/2021 for complaints of urinary frequency and dysuria.  Patient was given prescription for gemtesa 75mg  daily to help with his urinary frequency and urgency.  Patient  was advised to  use Pyridium as needed for his dysuria.  Patient has cystoscopy planned with urology in 1 month.  Patient was also  On physical exam normoactive bowel sounds, abdomen soft, nondistended, mild tenderness to suprapubic region.  Per triage note patient had fever of 104 with EMS; route of this temperature was not reported.  Patient afebrile during emergency department stay.  Will obtain CMP, CBC, urinalysis, urine culture and lactic acid.  CMP is unremarkable. CBC shows anemia with hemoglobin at 9.7 and hematocrit of 30.8.  These values are stable with lab work obtained 2 weeks prior which showed hemoglobin 9.8 and hematocrit 30.7 low suspicion for acute blood loss anemia at this time. Lactic acid within normal limits at 1.7.  Urinalysis shows bacteria rare, WBC greater than 50, RBC greater than 50, leukocytes large.  Due to patient's history of prostate cancer and complaints will start him on antibiotic treatment.  Patient is nontoxic-appearing, afebrile, vital signs stable, lactic acid within normal limits; he does not require inpatient therapy at this time.  Per chart review urine culture obtained on 01/22/21 showed no growth.    We will have patient follow-up with urology in outpatient setting.  Contacted patient's urology office and left a message requesting patient to be fitted to schedule for close follow-up.  Discussed results, findings, treatment and follow up. Patient advised of return precautions. Patient verbalized understanding and agreed with plan.     Final Clinical Impression(s) / ED Diagnoses Final diagnoses:  Urinary tract infection with hematuria, site unspecified  Dysuria  Urinary frequency    Rx / DC Orders ED Discharge Orders         Ordered    cephALEXin (KEFLEX) 500 MG capsule  4 times daily        02/09/21 1357           Loni Beckwith, PA-C 02/09/21 1718    Lorelle Gibbs, DO 02/10/21 1025

## 2021-02-09 NOTE — ED Triage Notes (Signed)
Coming from home, polyuria, hematuria, groin pain today, history of prostate and bladder cancer, last treatment was last month, recently seen at [facility] for uti, fever of 104.1 with ems, given 1g tylenol, 120mcg fentanyl, 862ml NS

## 2021-02-09 NOTE — Discharge Instructions (Addendum)
You came to the emergency department today to be evaluated for your painful urination, urinary frequency, and blood in your urine.  Your lab work today showed signs consistent with a urinary tract infection.  Due to this you were started on the antibiotic Keflex.  Please take 1 pill 4 times a day for the next 7 days.  Please follow-up with your urologist in the outpatient setting as soon as possible.  Please continue take all your medications as prescribed.  You may have diarrhea from the antibiotics.  It is very important that you continue to take the antibiotics even if you get diarrhea unless a medical professional tells you that you may stop taking them.  If you stop too early the bacteria you are being treated for will become stronger and you may need different, more powerful antibiotics that have more side effects and worsening diarrhea.  Please stay well hydrated and consider probiotics as they may decrease the severity of your diarrhea.    Get help right away if: You have a fever or chills. You develop severe vomiting and are unable to take medicine without vomiting. You develop severe pain in your back or abdomen even though you are taking medicine. You pass a large amount of blood in your urine. You feel very weak or like you might faint. You faint.

## 2021-02-09 NOTE — ED Notes (Signed)
Bladder Scan repeated x3 with 72ml

## 2021-02-09 NOTE — Telephone Encounter (Signed)
Pt called and said he had to see a dr. Or go to the ER. He said he is still passing clots upon urination and can't take pain. I advised him to go to ER.

## 2021-02-10 ENCOUNTER — Telehealth: Payer: Self-pay | Admitting: *Deleted

## 2021-02-10 LAB — URINE CULTURE: Culture: <10000 — AB

## 2021-02-10 NOTE — Telephone Encounter (Signed)
Transition Care Management Unsuccessful Follow-up Telephone Call  Date of discharge and from where:  02/09/2021 [facility]  Attempts:  1st Attempt  Reason for unsuccessful TCM follow-up call:  No answer/busy

## 2021-02-11 ENCOUNTER — Telehealth: Payer: Self-pay

## 2021-02-11 NOTE — Telephone Encounter (Signed)
Pt made aware that Dr. Alyson Ingles advised that he wait until the 29th to come in. Pt asked did I tell him 2 other doctors thought he needed to be seen earlier. I said he was made aware. Pt was not ok with this.

## 2021-02-11 NOTE — Telephone Encounter (Signed)
Transition Care Management Follow-up Telephone Call  Date of discharge and from where: 02/09/2021 from [facility]  How have you been since you were released from the hospital? Pt stated that he is still having pain. Pt was able to pick up his prescription.   Any questions or concerns? No  Items Reviewed:  Did the pt receive and understand the discharge instructions provided? Yes   Medications obtained and verified? Yes   Other? No   Any new allergies since your discharge? No   Dietary orders reviewed? n/a  Do you have support at home? Yes   Functional Questionnaire: (I = Independent and D = Dependent) ADLs: I  Bathing/Dressing- I  Meal Prep- I  Eating- I   Maintaining continence- I  Transferring/Ambulation- I  Managing Meds- I  Follow up appointments reviewed:   PCP Hospital f/u appt confirmed? No    Specialist Hospital f/u appt confirmed? No    Are transportation arrangements needed? No   If their condition worsens, is the pt aware to call PCP or go to the Emergency Dept.? Yes  Was the patient provided with contact information for the PCP's office or ED? Yes  Was to pt encouraged to call back with questions or concerns? Yes .

## 2021-02-14 NOTE — Telephone Encounter (Signed)
See prior note

## 2021-02-17 ENCOUNTER — Encounter: Payer: Medicare Other | Admitting: Family Medicine

## 2021-02-17 ENCOUNTER — Encounter: Payer: Self-pay | Admitting: Family Medicine

## 2021-02-17 ENCOUNTER — Telehealth: Payer: Self-pay

## 2021-02-17 NOTE — Telephone Encounter (Signed)
Pt called and notified giving him enough samples of Gemtesa to last until next appointment.

## 2021-02-26 ENCOUNTER — Observation Stay (HOSPITAL_COMMUNITY)
Admission: EM | Admit: 2021-02-26 | Discharge: 2021-02-26 | Disposition: A | Discharge status: Home / Self Care | Payer: Medicare Other | Attending: Internal Medicine | Admitting: Internal Medicine

## 2021-02-26 ENCOUNTER — Encounter (HOSPITAL_COMMUNITY): Payer: Self-pay | Admitting: Emergency Medicine

## 2021-02-26 ENCOUNTER — Other Ambulatory Visit: Payer: Self-pay

## 2021-02-26 DIAGNOSIS — Z20822 Contact with and (suspected) exposure to covid-19: Secondary | ICD-10-CM | POA: Diagnosis not present

## 2021-02-26 DIAGNOSIS — R319 Hematuria, unspecified: Secondary | ICD-10-CM | POA: Diagnosis present

## 2021-02-26 DIAGNOSIS — I10 Essential (primary) hypertension: Secondary | ICD-10-CM | POA: Diagnosis not present

## 2021-02-26 DIAGNOSIS — N3281 Overactive bladder: Secondary | ICD-10-CM | POA: Diagnosis present

## 2021-02-26 DIAGNOSIS — R310 Gross hematuria: Secondary | ICD-10-CM | POA: Diagnosis not present

## 2021-02-26 DIAGNOSIS — Z8551 Personal history of malignant neoplasm of bladder: Secondary | ICD-10-CM | POA: Diagnosis not present

## 2021-02-26 DIAGNOSIS — R300 Dysuria: Secondary | ICD-10-CM | POA: Diagnosis present

## 2021-02-26 DIAGNOSIS — Z79899 Other long term (current) drug therapy: Secondary | ICD-10-CM | POA: Diagnosis not present

## 2021-02-26 DIAGNOSIS — N289 Disorder of kidney and ureter, unspecified: Secondary | ICD-10-CM

## 2021-02-26 DIAGNOSIS — Z87891 Personal history of nicotine dependence: Secondary | ICD-10-CM | POA: Diagnosis not present

## 2021-02-26 DIAGNOSIS — C61 Malignant neoplasm of prostate: Secondary | ICD-10-CM | POA: Diagnosis present

## 2021-02-26 DIAGNOSIS — Z8546 Personal history of malignant neoplasm of prostate: Secondary | ICD-10-CM | POA: Insufficient documentation

## 2021-02-26 DIAGNOSIS — D494 Neoplasm of unspecified behavior of bladder: Secondary | ICD-10-CM | POA: Diagnosis not present

## 2021-02-26 LAB — CBC WITH DIFFERENTIAL/PLATELET
Abs Immature Granulocytes: 0.01 10*3/uL (ref 0.00–0.07)
Basophils Absolute: 0 10*3/uL (ref 0.0–0.1)
Basophils Relative: 1 %
Eosinophils Absolute: 0.1 10*3/uL (ref 0.0–0.5)
Eosinophils Relative: 1 %
HCT: 26.3 % — ABNORMAL LOW (ref 39.0–52.0)
Hemoglobin: 8 g/dL — ABNORMAL LOW (ref 13.0–17.0)
Immature Granulocytes: 0 %
Lymphocytes Relative: 12 %
Lymphs Abs: 0.6 10*3/uL — ABNORMAL LOW (ref 0.7–4.0)
MCH: 27 pg (ref 26.0–34.0)
MCHC: 30.4 g/dL (ref 30.0–36.0)
MCV: 88.9 fL (ref 80.0–100.0)
Monocytes Absolute: 0.4 10*3/uL (ref 0.1–1.0)
Monocytes Relative: 9 %
Neutro Abs: 3.6 10*3/uL (ref 1.7–7.7)
Neutrophils Relative %: 77 %
Platelets: 383 10*3/uL (ref 150–400)
RBC: 2.96 MIL/uL — ABNORMAL LOW (ref 4.22–5.81)
RDW: 14.9 % (ref 11.5–15.5)
WBC: 4.7 10*3/uL (ref 4.0–10.5)
nRBC: 0 % (ref 0.0–0.2)

## 2021-02-26 LAB — URINALYSIS, ROUTINE W REFLEX MICROSCOPIC
Bacteria, UA: NONE SEEN
Bilirubin Urine: NEGATIVE
Glucose, UA: NEGATIVE mg/dL
Ketones, ur: NEGATIVE mg/dL
Nitrite: POSITIVE — AB
Protein, ur: 100 mg/dL — AB
RBC / HPF: >50 RBC/hpf — ABNORMAL HIGH (ref 0–5)
Specific Gravity, Urine: 1.01 (ref 1.005–1.030)
WBC, UA: >50 WBC/hpf — ABNORMAL HIGH (ref 0–5)
pH: 7 (ref 5.0–8.0)

## 2021-02-26 LAB — BASIC METABOLIC PANEL
Anion gap: 9 (ref 5–15)
BUN: 9 mg/dL (ref 8–23)
CO2: 21 mmol/L — ABNORMAL LOW (ref 22–32)
Calcium: 8.7 mg/dL — ABNORMAL LOW (ref 8.9–10.3)
Chloride: 107 mmol/L (ref 98–111)
Creatinine, Ser: 1.39 mg/dL — ABNORMAL HIGH (ref 0.61–1.24)
GFR, Estimated: 55 mL/min — ABNORMAL LOW (ref >60)
Glucose, Bld: 111 mg/dL — ABNORMAL HIGH (ref 70–99)
Potassium: 4 mmol/L (ref 3.5–5.1)
Sodium: 137 mmol/L (ref 135–145)

## 2021-02-26 LAB — PREPARE RBC (CROSSMATCH)

## 2021-02-26 MED ORDER — TAMSULOSIN HCL 0.4 MG PO CAPS
0.4000 mg | ORAL_CAPSULE | Freq: Every day | ORAL | Status: DC
  Administered 2021-02-26: 0.4 mg via ORAL
  Filled 2021-02-26: qty 1

## 2021-02-26 MED ORDER — SODIUM CHLORIDE 0.9% IV SOLUTION: Freq: Once | INTRAVENOUS | Status: AC

## 2021-02-26 MED ORDER — AMLODIPINE BESYLATE 5 MG PO TABS
10.0000 mg | ORAL_TABLET | Freq: Every day | ORAL | Status: DC
  Administered 2021-02-26: 10 mg via ORAL
  Filled 2021-02-26: qty 2

## 2021-02-26 MED ORDER — FERROUS SULFATE 325 (65 FE) MG PO TBEC: 325.0000 mg | DELAYED_RELEASE_TABLET | Freq: Two times a day (BID) | ORAL | 1 refills | Status: DC

## 2021-02-26 MED ORDER — FINASTERIDE 5 MG PO TABS
5.0000 mg | ORAL_TABLET | Freq: Every day | ORAL | Status: DC
  Administered 2021-02-26: 5 mg via ORAL
  Filled 2021-02-26: qty 1

## 2021-02-26 MED ORDER — VITAMIN B-12 1000 MCG PO TABS
1000.0000 ug | ORAL_TABLET | Freq: Every day | ORAL | Status: DC
  Administered 2021-02-26: 1000 ug via ORAL
  Filled 2021-02-26: qty 1

## 2021-02-26 MED ORDER — PHENAZOPYRIDINE HCL 100 MG PO TABS
95.0000 mg | ORAL_TABLET | Freq: Three times a day (TID) | ORAL | Status: DC
  Administered 2021-02-26: 100 mg via ORAL
  Filled 2021-02-26: qty 1

## 2021-02-26 MED ORDER — OXYCODONE-ACETAMINOPHEN 5-325 MG PO TABS: 1.0000 | ORAL_TABLET | Freq: Four times a day (QID) | ORAL | 0 refills | Status: DC | PRN

## 2021-02-26 MED ORDER — OXYCODONE HCL 5 MG PO TABS: 5.0000 mg | ORAL_TABLET | Freq: Four times a day (QID) | ORAL | 0 refills | Status: DC | PRN

## 2021-02-26 MED ORDER — OXYBUTYNIN CHLORIDE 5 MG PO TABS
5.0000 mg | ORAL_TABLET | Freq: Two times a day (BID) | ORAL | Status: DC
  Filled 2021-02-26: qty 1

## 2021-02-26 MED ORDER — URIBEL 118 MG PO CAPS: 1.0000 | ORAL_CAPSULE | Freq: Two times a day (BID) | ORAL | Status: DC | PRN

## 2021-02-26 MED ORDER — SODIUM CHLORIDE 0.9 % IV SOLN: INTRAVENOUS | Status: DC

## 2021-02-26 MED ORDER — OXYBUTYNIN CHLORIDE 5 MG PO TABS: 5.0000 mg | ORAL_TABLET | Freq: Two times a day (BID) | ORAL | 0 refills | Status: DC

## 2021-02-26 MED ORDER — OXYCODONE HCL 5 MG PO TABS
5.0000 mg | ORAL_TABLET | Freq: Four times a day (QID) | ORAL | Status: DC | PRN
Start: 2021-02-26 — End: 2021-02-27
  Administered 2021-02-26: 5 mg via ORAL
  Filled 2021-02-26: qty 1

## 2021-02-26 MED ORDER — VIBEGRON 75 MG PO TABS: 1.0000 | ORAL_TABLET | Freq: Every day | ORAL | Status: DC

## 2021-02-26 NOTE — ED Notes (Signed)
Pt requesting medications be moved to another pharmacy, Dr. [facility] made aware, prescriptions are narcotics, unable to change pharmacies at this time, pt cannot receive narcotics in department at this time as he wants to drive himself home. Pre-pack prescription provided per Dr. Roderic Palau with Dr. [facility].

## 2021-02-26 NOTE — ED Notes (Signed)
Pt passed a blood clot through urine, wanted doctor to be made aware, Dr. Keturah Barre. Elgergawy made aware of clot via urine and that pt is complaining of 10/10 penile pain. MD reports that it will keep happening and that he has noted the clot, tell pt to stay hydrated, no new orders at this time.

## 2021-02-26 NOTE — Consult Note (Signed)
Consult   Patient Demographics:    Tony Powell, is a 70 y.o. male  MRN: [id number]   DOB - 1951-08-07  Admit Date - 02/26/2021  Outpatient Primary MD for the patient is Erven Colla, DO  Referring MD/NP/PA: Dr Kem Boroughs  Outpatient Specialists: urologist Dr Luna Kitchens  Patient coming from: Home  Chief Complaint  Patient presents with  . Hematuria      HPI:    Tony Powell  is a 70 y.o. 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 Alyson Ingles at the end of the month, port is passing small clots as well, he denies fever, chills, back pain.  She was recently treated with 2 courses of antibiotics for UTI. - in ED work-up was significant for hematuria, but no urinary retention, hemoglobin did drop from 9.7 on 3/30-8 this admission, he is afebrile, with no leukocytosis, creatinine at 1.39, ED physician, and I have discussed with urology Dr. Junious Silk, who recommended outpatient follow-up with Dr. Alyson Ingles, and to add oxybutynin to his GEMTESA, and his chronic blood loss anemia, patient will transfuse 1 unit PRBC prior to discharge from ED, I have offered the patient to stay overnight, he reports he wants to go home for Easter dinner with the family given his vital signs stable, and is feeling well I think this is appropriate, so he will be discharged from ED.    Review of systems:    In addition to the HPI above,  No Fever-chills, No Headache, No changes with Vision or hearing, No problems swallowing food or Liquids, No Chest pain, Cough or Shortness of  Breath, No Abdominal pain, No Nausea or Vommitting, Bowel movements are regular, Reports dysuria, hematuria and bladder spasms No new skin rashes or bruises, No new joints pains-aches,  No new weakness, tingling, numbness in any extremity, No recent weight gain or loss, No polyuria, polydypsia or polyphagia, No significant Mental Stressors.  A full 10 point Review of Systems was done, except as stated above, all other Review of Systems were negative.   With Past History of the following :    Past Medical History:  Diagnosis Date  . Abnormal stress test   . Borderline hypertension   . Chest discomfort    Atypical  . DJD (degenerative joint disease)    Knees, lumbar sacral spine  . Hyperlipidemia   . Hypertension   . Prostate cancer [facility])       Past Surgical History:  Procedure Laterality Date  . BACK SURGERY    . TRANSURETHRAL RESECTION OF BLADDER TUMOR N/A 08/25/2020   Procedure: TRANSURETHRAL RESECTION OF BLADDER TUMOR (TURBT);  Surgeon: Cleon Gustin, MD;  Location: [facility];  Service: Urology;  Laterality: N/A;      Social  History:     Social History   Tobacco Use  . Smoking status: Former Smoker    Packs/day: 1.00    Years: 20.00    Pack years: 20.00    Quit date: 11/13/1998    Years since quitting: 22.3  . Smokeless tobacco: Never Used  Substance Use Topics  . Alcohol use: No       Family History :     Family History  Problem Relation Age of Onset  . Heart attack Mother   . Breast cancer Mother        Mastectomy  . Leukemia Father   . Cancer Brother   . Cancer Brother   . Cancer Brother   . Cancer Brother   . Cancer Brother   . Colon cancer Neg Hx   . Prostate cancer Neg Hx   . Pancreatic cancer Neg Hx     Home Medications:   Prior to Admission medications   Medication Sig Start Date End Date Taking? Authorizing Provider  amLODipine (NORVASC) 10 MG tablet Take 1 tablet (10 mg total) by mouth daily. 01/24/21  Yes Emokpae, Courage, MD   cyanocobalamin 1000 MCG tablet Take 1,000 mcg by mouth daily.   Yes [provider]  finasteride (PROSCAR) 5 MG tablet Take 5 mg by mouth daily.  07/02/20  Yes [provider]  megestrol (MEGACE) 20 MG tablet Take 1 tablet (20 mg total) by mouth 2 (two) times daily. As needed. Patient taking differently: Take 20 mg by mouth 2 (two) times daily as needed (urinary symptoms). 01/17/21  Yes McKenzie, Candee Furbish, MD  Meth-Hyo-M Barnett Hatter Phos-Ph Sal (URIBEL) 118 MG CAPS Take 1 capsule (118 mg total) by mouth 2 (two) times daily as needed. 01/19/21  Yes McKenzie, Candee Furbish, MD  naproxen sodium (ALEVE) 220 MG tablet Take 660 mg by mouth daily as needed.   Yes [provider]  phenazopyridine (PYRIDIUM) 95 MG tablet Take 95 mg by mouth 3 (three) times daily as needed for pain.   Yes [provider]  tamsulosin (FLOMAX) 0.4 MG CAPS capsule Take 1 capsule (0.4 mg total) by mouth daily. 01/24/21  Yes Emokpae, Courage, MD  Vibegron (GEMTESA) 75 MG TABS Take 1 capsule by mouth daily. 01/28/21  Yes McKenzie, Candee Furbish, MD  Methen-Hyosc-Meth Blue-Na Phos (UROGESIC-BLUE) 81.6 MG TABS Take 1 tablet (81.6 mg total) by mouth 2 (two) times daily as needed. Patient not taking: Reported on 02/26/2021 01/19/21   Cleon Gustin, MD  ondansetron (ZOFRAN ODT) 4 MG disintegrating tablet Take 1 tablet (4 mg total) by mouth every 8 (eight) hours as needed for nausea or vomiting. Patient not taking: No sig reported 08/26/20   Cleon Gustin, MD  oxyCODONE-acetaminophen (PERCOCET) 5-325 MG tablet Take 1 tablet by mouth every 4 (four) hours as needed for moderate pain or severe pain. Patient not taking: No sig reported 01/28/21 01/28/22  Cleon Gustin, MD     Allergies:     Allergies  Allergen Reactions  . Celebrex [Celecoxib]     Eyes drooping and messed up skin on hands     Physical Exam:   Vitals  Blood pressure (!) 170/81, pulse 85, temperature 98.8 F (37.1 C), temperature  source Oral, resp. rate 10, height 5\' 10"  (1.778 m), weight 105.2 kg, SpO2 99 %.   1. General developed male, laying in bed, no apparent distress  2. Normal affect and insight, Not Suicidal or Homicidal, Awake Alert, Oriented X 3.  3. No  F.N deficits, ALL C.Nerves Intact, Strength 5/5 all 4 extremities, Sensation intact all 4 extremities, Plantars down going.  4. Ears and Eyes appear Normal, Conjunctivae clear, PERRLA. Moist Oral Mucosa.  5. Supple Neck, No JVD, No cervical lymphadenopathy appriciated, No Carotid Bruits.  6. Symmetrical Chest wall movement, Good air movement bilaterally, CTAB.  7. RRR, No Gallops, Rubs or Murmurs, No Parasternal Heave.  8. Positive Bowel Sounds, Abdomen Soft, mild suprapubic tenderness, No organomegaly appriciated,No rebound -guarding or rigidity.  9.  No Cyanosis, Normal Skin Turgor, No Skin Rash or Bruise.  10. Good muscle tone,  joints appear normal , no effusions, Normal ROM.    Data Review:    CBC Recent Labs  Lab 02/26/21 1443  WBC 4.7  HGB 8.0*  HCT 26.3*  PLT 383  MCV 88.9  MCH 27.0  MCHC 30.4  RDW 14.9  LYMPHSABS 0.6*  MONOABS 0.4  EOSABS 0.1  BASOSABS 0.0   ------------------------------------------------------------------------------------------------------------------  Chemistries  Recent Labs  Lab 02/26/21 1443  NA 137  K 4.0  CL 107  CO2 21*  GLUCOSE 111*  BUN 9  CREATININE 1.39*  CALCIUM 8.7*   ------------------------------------------------------------------------------------------------------------------ estimated creatinine clearance is 60.9 mL/min (A) (by C-G formula based on SCr of 1.39 mg/dL (H)). ------------------------------------------------------------------------------------------------------------------ No results for input(s): TSH, T4TOTAL, T3FREE, THYROIDAB in the last 72 hours.  Invalid input(s): FREET3  Coagulation profile No results for input(s): INR, PROTIME in the last 168  hours. ------------------------------------------------------------------------------------------------------------------- No results for input(s): DDIMER in the last 72 hours. -------------------------------------------------------------------------------------------------------------------  Cardiac Enzymes No results for input(s): CKMB, TROPONINI, MYOGLOBIN in the last 168 hours.  Invalid input(s): CK ------------------------------------------------------------------------------------------------------------------ No results found for: BNP   ---------------------------------------------------------------------------------------------------------------  Urinalysis    Component Value Date/Time   COLORURINE BLOODY (A) 02/26/2021 1442   APPEARANCEUR CLOUDY (A) 02/26/2021 1442   APPEARANCEUR Turbid (A) 01/28/2021 1015   LABSPEC 1.010 02/26/2021 1442   PHURINE 7.0 02/26/2021 1442   GLUCOSEU NEGATIVE 02/26/2021 1442   HGBUR LARGE (A) 02/26/2021 1442   BILIRUBINUR NEGATIVE 02/26/2021 1442   BILIRUBINUR Negative 01/28/2021 [place] 02/26/2021 1442   PROTEINUR 100 (A) 02/26/2021 1442   NITRITE POSITIVE (A) 02/26/2021 1442   LEUKOCYTESUR SMALL (A) 02/26/2021 1442    ----------------------------------------------------------------------------------------------------------------   Imaging Results:    No results found.  Assessment & Plan:    Active Problems:   Bladder tumor   Prostate cancer (HCC)   Abnormal kidney function   Dysuria   OAB (overactive bladder)   Hematuria   UTI/hematuria -Recent diagnosing of prostate/bladder cancer, status post resection and radiation. 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 -No evidence of urinary retention post void on bladder scan 75 cc. -Continue with as needed with oxycodone, continue with Pyridium and Uribel. -Continue with finasteride, and tamsulosin -Discussed with Dr. Junious Silk, plan to start on oxybutynin in  addition to [facility] for overactive bladder.  he is to keep his follow-up appointment with Dr. Alyson Ingles.  Chronic blood loss anemia -Hemoglobin dropped from 9.7 on 3/30 to  8 today due to hematuria, hemoglobin of 8, will transfuse 1 unit PRBC as he is anticipated to still having a very low level hematuria in the recent future .dissipated to have low level hematuria recent future. -He will be started on iron supplements.   Prostate /bladder cancer -he is followed  by Dr. Alyson Ingles, already have an appointment scheduled as an outpatient, he was instructed to keep that appointment  I have discussed with the patient, offered him stay overnight  for observation, but patient adamant about going home today after transfusion so he can attend Easter dinner with the family, which I do think is appropriate, with recommendation to follow with his PCP early next week regarding repeat lab clinic CBC and BMP.  AM Labs Ordered, also please review Full Orders  Family Communication: Admission, patients condition and plan of care including tests being ordered have been discussed with the patient  who indicate understanding and agree with the plan and Code Status.  Code Status Full  Likely DC to  Home  Condition GUARDED    Consults called: D/W urology Dr Junious Silk by phone    Time spent in minutes : 80 minutes   Phillips Climes M.D on 02/26/2021 at 6:19 PM   [facility] - Office  [phone]

## 2021-02-26 NOTE — H&P (Signed)
Same-day admission H&P/discharge summary note   Patient Demographics:    Tony Powell, is a 70 y.o. male  MRN: [id number]   DOB - Nov 05, 1951  Admit Date - 02/26/2021  Outpatient Primary MD for the patient is Erven Colla, DO  Referring MD/NP/PA: Dr Kem Boroughs  Outpatient Specialists: urologist Dr Luna Kitchens  Patient coming from: Home  Chief Complaint  Patient presents with  . Hematuria      HPI:    Tony Powell  is a 70 y.o. 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 Alyson Ingles at the end of the month, port is passing small clots as well, he denies fever, chills, back pain.  She was recently treated with 2 courses of antibiotics for UTI. - in ED work-up was significant for hematuria, but no urinary retention, hemoglobin did drop from 9.7 on 3/30-8 this admission, he is afebrile, with no leukocytosis, creatinine at 1.39, ED physician, and I have discussed with urology Dr. Junious Silk, who recommended outpatient follow-up with Dr. Alyson Ingles, and to add oxybutynin to his GEMTESA, and his chronic blood loss anemia, patient will transfuse 1 unit PRBC prior to discharge from ED, I have offered the patient to stay overnight, he reports he wants to go home for Easter dinner with the family given his vital signs stable, and is feeling well I think this is appropriate, so he will be discharged from ED.    Review of systems:    In addition to the HPI above,  No Fever-chills, No Headache, No changes with Vision or hearing, No problems swallowing food or Liquids, No Chest  pain, Cough or Shortness of Breath, No Abdominal pain, No Nausea or Vommitting, Bowel movements are regular, Reports dysuria, hematuria and bladder spasms No new skin rashes or bruises, No new joints pains-aches,  No new weakness, tingling, numbness in any extremity, No recent weight gain or loss, No polyuria, polydypsia or polyphagia, No significant Mental Stressors.  A full 10 point Review of Systems was done, except as stated above, all other Review of Systems were negative.   With Past History of the following :    Past Medical History:  Diagnosis Date  . Abnormal stress test   . Borderline hypertension   . Chest discomfort    Atypical  . DJD (degenerative joint disease)    Knees, lumbar sacral spine  . Hyperlipidemia   . Hypertension   . Prostate cancer [facility])       Past Surgical History:  Procedure Laterality Date  . BACK SURGERY    . TRANSURETHRAL RESECTION OF BLADDER TUMOR N/A 08/25/2020   Procedure: TRANSURETHRAL RESECTION OF BLADDER TUMOR (TURBT);  Surgeon: Cleon Gustin, MD;  Location: [facility];  Service: Urology;  Laterality: N/A;  Social History:     Social History   Tobacco Use  . Smoking status: Former Smoker    Packs/day: 1.00    Years: 20.00    Pack years: 20.00    Quit date: 11/13/1998    Years since quitting: 22.3  . Smokeless tobacco: Never Used  Substance Use Topics  . Alcohol use: No       Family History :     Family History  Problem Relation Age of Onset  . Heart attack Mother   . Breast cancer Mother        Mastectomy  . Leukemia Father   . Cancer Brother   . Cancer Brother   . Cancer Brother   . Cancer Brother   . Cancer Brother   . Colon cancer Neg Hx   . Prostate cancer Neg Hx   . Pancreatic cancer Neg Hx     Home Medications:   Prior to Admission medications   Medication Sig Start Date End Date Taking? Authorizing Provider  amLODipine (NORVASC) 10 MG tablet Take 1 tablet (10 mg total) by mouth daily. 01/24/21   Yes Emokpae, Courage, MD  cyanocobalamin 1000 MCG tablet Take 1,000 mcg by mouth daily.   Yes [provider]  finasteride (PROSCAR) 5 MG tablet Take 5 mg by mouth daily.  07/02/20  Yes [provider]  megestrol (MEGACE) 20 MG tablet Take 1 tablet (20 mg total) by mouth 2 (two) times daily. As needed. Patient taking differently: Take 20 mg by mouth 2 (two) times daily as needed (urinary symptoms). 01/17/21  Yes McKenzie, Candee Furbish, MD  Meth-Hyo-M Barnett Hatter Phos-Ph Sal (URIBEL) 118 MG CAPS Take 1 capsule (118 mg total) by mouth 2 (two) times daily as needed. 01/19/21  Yes McKenzie, Candee Furbish, MD  naproxen sodium (ALEVE) 220 MG tablet Take 660 mg by mouth daily as needed.   Yes [provider]  phenazopyridine (PYRIDIUM) 95 MG tablet Take 95 mg by mouth 3 (three) times daily as needed for pain.   Yes [provider]  tamsulosin (FLOMAX) 0.4 MG CAPS capsule Take 1 capsule (0.4 mg total) by mouth daily. 01/24/21  Yes Emokpae, Courage, MD  Vibegron (GEMTESA) 75 MG TABS Take 1 capsule by mouth daily. 01/28/21  Yes McKenzie, Candee Furbish, MD  Methen-Hyosc-Meth Blue-Na Phos (UROGESIC-BLUE) 81.6 MG TABS Take 1 tablet (81.6 mg total) by mouth 2 (two) times daily as needed. Patient not taking: Reported on 02/26/2021 01/19/21   Cleon Gustin, MD  ondansetron (ZOFRAN ODT) 4 MG disintegrating tablet Take 1 tablet (4 mg total) by mouth every 8 (eight) hours as needed for nausea or vomiting. Patient not taking: No sig reported 08/26/20   Cleon Gustin, MD  oxyCODONE-acetaminophen (PERCOCET) 5-325 MG tablet Take 1 tablet by mouth every 4 (four) hours as needed for moderate pain or severe pain. Patient not taking: No sig reported 01/28/21 01/28/22  Cleon Gustin, MD     Allergies:     Allergies  Allergen Reactions  . Celebrex [Celecoxib]     Eyes drooping and messed up skin on hands     Physical Exam:   Vitals  Blood pressure (!) 164/83, pulse 89, temperature 98.3 F  (36.8 C), temperature source Oral, resp. rate 13, height 5\' 10"  (1.778 m), weight 105.2 kg, SpO2 99 %.   1. General developed male, laying in bed, no apparent distress  2. Normal affect and insight, Not Suicidal or Homicidal, Awake Alert, Oriented X 3.  3.  No F.N deficits, ALL C.Nerves Intact, Strength 5/5 all 4 extremities, Sensation intact all 4 extremities, Plantars down going.  4. Ears and Eyes appear Normal, Conjunctivae clear, PERRLA. Moist Oral Mucosa.  5. Supple Neck, No JVD, No cervical lymphadenopathy appriciated, No Carotid Bruits.  6. Symmetrical Chest wall movement, Good air movement bilaterally, CTAB.  7. RRR, No Gallops, Rubs or Murmurs, No Parasternal Heave.  8. Positive Bowel Sounds, Abdomen Soft, mild suprapubic tenderness, No organomegaly appriciated,No rebound -guarding or rigidity.  9.  No Cyanosis, Normal Skin Turgor, No Skin Rash or Bruise.  10. Good muscle tone,  joints appear normal , no effusions, Normal ROM.    Data Review:    CBC Recent Labs  Lab 02/26/21 1443  WBC 4.7  HGB 8.0*  HCT 26.3*  PLT 383  MCV 88.9  MCH 27.0  MCHC 30.4  RDW 14.9  LYMPHSABS 0.6*  MONOABS 0.4  EOSABS 0.1  BASOSABS 0.0   ------------------------------------------------------------------------------------------------------------------  Chemistries  Recent Labs  Lab 02/26/21 1443  NA 137  K 4.0  CL 107  CO2 21*  GLUCOSE 111*  BUN 9  CREATININE 1.39*  CALCIUM 8.7*   ------------------------------------------------------------------------------------------------------------------ estimated creatinine clearance is 60.9 mL/min (A) (by C-G formula based on SCr of 1.39 mg/dL (H)). ------------------------------------------------------------------------------------------------------------------ No results for input(s): TSH, T4TOTAL, T3FREE, THYROIDAB in the last 72 hours.  Invalid input(s): FREET3  Coagulation profile No results for input(s): INR, PROTIME in  the last 168 hours. ------------------------------------------------------------------------------------------------------------------- No results for input(s): DDIMER in the last 72 hours. -------------------------------------------------------------------------------------------------------------------  Cardiac Enzymes No results for input(s): CKMB, TROPONINI, MYOGLOBIN in the last 168 hours.  Invalid input(s): CK ------------------------------------------------------------------------------------------------------------------ No results found for: BNP   ---------------------------------------------------------------------------------------------------------------  Urinalysis    Component Value Date/Time   COLORURINE BLOODY (A) 02/26/2021 1442   APPEARANCEUR CLOUDY (A) 02/26/2021 1442   APPEARANCEUR Turbid (A) 01/28/2021 1015   LABSPEC 1.010 02/26/2021 1442   PHURINE 7.0 02/26/2021 1442   GLUCOSEU NEGATIVE 02/26/2021 1442   HGBUR LARGE (A) 02/26/2021 1442   BILIRUBINUR NEGATIVE 02/26/2021 1442   BILIRUBINUR Negative 01/28/2021 [place] 02/26/2021 1442   PROTEINUR 100 (A) 02/26/2021 1442   NITRITE POSITIVE (A) 02/26/2021 1442   LEUKOCYTESUR SMALL (A) 02/26/2021 1442    ----------------------------------------------------------------------------------------------------------------   Imaging Results:    No results found.  Assessment & Plan:    Active Problems:   Bladder tumor   Prostate cancer (HCC)   Abnormal kidney function   Dysuria   OAB (overactive bladder)   Hematuria   UTI/hematuria -Recent diagnosing of prostate/bladder cancer, status post resection and radiation. 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 -No evidence of urinary retention post void on bladder scan 75 cc. -Continue with as needed with oxycodone, continue with Pyridium and Uribel. -Continue with finasteride, and tamsulosin -Discussed with Dr. Junious Silk, plan to start on  oxybutynin in addition to [facility] for overactive bladder.  he is to keep his follow-up appointment with Dr. Alyson Ingles.  Chronic blood loss anemia -Hemoglobin dropped from 9.7 on 3/30 to  8 today due to hematuria, hemoglobin of 8, will transfuse 1 unit PRBC as he is anticipated to still having a very low level hematuria in the recent future .dissipated to have low level hematuria recent future. -He will be started on iron supplements.   Prostate /bladder cancer -he is followed  by Dr. Alyson Ingles, already have an appointment scheduled as an outpatient, he was instructed to keep that appointment  I have discussed with the patient, offered him stay  overnight for observation, but patient adamant about going home today after transfusion so he can attend Easter dinner with the family, which I do think is appropriate, with recommendation to follow with his PCP early next week regarding repeat lab clinic CBC and BMP.  AM Labs Ordered, also please review Full Orders  Family Communication: Admission, patients condition and plan of care including tests being ordered have been discussed with the patient  who indicate understanding and agree with the plan and Code Status.  Code Status Full  Likely DC to  Home  Condition GUARDED    Consults called: D/W urology Dr Junious Silk by phone    Admission status: Observation  Time spent in minutes : 60 minutes   Phillips Climes M.D on 02/26/2021 at 4:25 PM   [facility] - Office  [phone]

## 2021-02-26 NOTE — ED Triage Notes (Signed)
Pt c/o hematuria x months with penial pain; reports he has hx of prostate cancer and bladder cancer

## 2021-02-26 NOTE — ED Provider Notes (Signed)
[facility] EMERGENCY DEPARTMENT Provider Note   CSN: [id number] Arrival date & time: 02/26/21  1326     History Chief Complaint  Patient presents with  . Hematuria    Tony Powell is a 70 y.o. male.  HPI    70 year old male comes in a chief complaint of hematuria.  Patient has history of prostate and bladder cancer.  He is status post surgical care end of last year and is currently seeing urologist for continued management.  He also had radiation therapy.  Patient reports that he had a Foley catheter placed for urinary retention for 3 months last winter.  After the Foley catheter was removed, he does not have urinary retention issue but he has continued hematuria problems.  He was seen in the ER twice in March, received antibiotics on both occasions.  His symptoms have worsened.  He is passing small clots.  Patient continues to have urine every 30 minutes or so along with, discomfort with urination.  No back pain, no fevers, no chills.   Patient is supposed to see urologist in 2 weeks.  He does not think he can wait that long.  Past Medical History:  Diagnosis Date  . Abnormal stress test   . Borderline hypertension   . Chest discomfort    Atypical  . DJD (degenerative joint disease)    Knees, lumbar sacral spine  . Hyperlipidemia   . Hypertension   . Prostate cancer [facility])     Patient Active Problem List   Diagnosis Date Noted  . Dysuria 02/02/2021  . OAB (overactive bladder) 02/02/2021  . Recent Pseudomonas and Enterococcus faecalis catheter associated UTI- 01/22/2021  . Acute hypokalemia 01/21/2021  . Diarrhea 01/21/2021  . Abnormal kidney function 12/31/2020  . Prostate cancer ([place]) 08/30/2020  . Bladder tumor 08/23/2020  . Gross hematuria 08/19/2020  . Difficulty voiding 08/13/2020  . 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/13/2020  . History of prostatitis 05/02/2020  . HNP (herniated nucleus pulposus), lumbar 04/16/2013  . HYPERLIPIDEMIA  05/04/2010  . OVERWEIGHT 05/04/2010  . Elevated blood pressure reading in office with diagnosis of hypertension 04/28/2010  . DEGENERATIVE JOINT DISEASE 04/28/2010  . CHEST PAIN 04/28/2010  . ABNORMAL STRESS ELECTROCARDIOGRAM 04/28/2010    Past Surgical History:  Procedure Laterality Date  . BACK SURGERY    . TRANSURETHRAL RESECTION OF BLADDER TUMOR N/A 08/25/2020   Procedure: TRANSURETHRAL RESECTION OF BLADDER TUMOR (TURBT);  Surgeon: Cleon Gustin, MD;  Location: [facility];  Service: Urology;  Laterality: N/A;       Family History  Problem Relation Age of Onset  . Heart attack Mother   . Breast cancer Mother        Mastectomy  . Leukemia Father   . Cancer Brother   . Cancer Brother   . Cancer Brother   . Cancer Brother   . Cancer Brother   . Colon cancer Neg Hx   . Prostate cancer Neg Hx   . Pancreatic cancer Neg Hx     Social History   Tobacco Use  . Smoking status: Former Smoker    Packs/day: 1.00    Years: 20.00    Pack years: 20.00    Quit date: 11/13/1998    Years since quitting: 22.3  . Smokeless tobacco: Never Used  Substance Use Topics  . Alcohol use: No  . Drug use: Never    Home Medications Prior to Admission medications   Medication Sig Start Date End Date Taking? Authorizing  Provider  amLODipine (NORVASC) 10 MG tablet Take 1 tablet (10 mg total) by mouth daily. 01/24/21  Yes Emokpae, Courage, MD  cyanocobalamin 1000 MCG tablet Take 1,000 mcg by mouth daily.   Yes [provider]  finasteride (PROSCAR) 5 MG tablet Take 5 mg by mouth daily.  07/02/20  Yes [provider]  megestrol (MEGACE) 20 MG tablet Take 1 tablet (20 mg total) by mouth 2 (two) times daily. As needed. Patient taking differently: Take 20 mg by mouth 2 (two) times daily as needed (urinary symptoms). 01/17/21  Yes McKenzie, Candee Furbish, MD  Meth-Hyo-M Barnett Hatter Phos-Ph Sal (URIBEL) 118 MG CAPS Take 1 capsule (118 mg total) by mouth 2 (two) times daily as needed. 01/19/21  Yes  McKenzie, Candee Furbish, MD  naproxen sodium (ALEVE) 220 MG tablet Take 660 mg by mouth daily as needed.   Yes [provider]  phenazopyridine (PYRIDIUM) 95 MG tablet Take 95 mg by mouth 3 (three) times daily as needed for pain.   Yes [provider]  tamsulosin (FLOMAX) 0.4 MG CAPS capsule Take 1 capsule (0.4 mg total) by mouth daily. 01/24/21  Yes Emokpae, Courage, MD  Vibegron (GEMTESA) 75 MG TABS Take 1 capsule by mouth daily. 01/28/21  Yes McKenzie, Candee Furbish, MD  Methen-Hyosc-Meth Blue-Na Phos (UROGESIC-BLUE) 81.6 MG TABS Take 1 tablet (81.6 mg total) by mouth 2 (two) times daily as needed. Patient not taking: Reported on 02/26/2021 01/19/21   Cleon Gustin, MD  ondansetron (ZOFRAN ODT) 4 MG disintegrating tablet Take 1 tablet (4 mg total) by mouth every 8 (eight) hours as needed for nausea or vomiting. Patient not taking: No sig reported 08/26/20   Cleon Gustin, MD  oxyCODONE-acetaminophen (PERCOCET) 5-325 MG tablet Take 1 tablet by mouth every 4 (four) hours as needed for moderate pain or severe pain. Patient not taking: No sig reported 01/28/21 01/28/22  Cleon Gustin, MD    Allergies    Celebrex [celecoxib]  Review of Systems   Review of Systems  Constitutional: Positive for activity change.  Gastrointestinal: Negative for abdominal pain.  Genitourinary: Positive for dysuria and hematuria.  Allergic/Immunologic: Negative for immunocompromised state.  Hematological: Does not bruise/bleed easily.  All other systems reviewed and are negative.   Physical Exam Updated Vital Signs BP (!) 158/88   Pulse (!) 103   Temp 98.3 F (36.8 C) (Oral)   Resp 13   Ht 5\' 10"  (1.778 m)   Wt 105.2 kg   SpO2 100%   BMI 33.28 kg/m   Physical Exam Vitals and nursing note reviewed.  Constitutional:      Appearance: He is well-developed.  HENT:     Head: Atraumatic.  Cardiovascular:     Rate and Rhythm: Normal rate.  Pulmonary:     Effort: Pulmonary effort  is normal.  Abdominal:     Tenderness: There is no abdominal tenderness.  Musculoskeletal:     Cervical back: Neck supple.  Skin:    General: Skin is warm.  Neurological:     Mental Status: He is alert and oriented to person, place, and time.     ED Results / Procedures / Treatments   Labs (all labs ordered are listed, but only abnormal results are displayed) Labs Reviewed  BASIC METABOLIC PANEL - Abnormal; Notable for the following components:      Result Value   CO2 21 (*)    Glucose, Bld 111 (*)    Creatinine, Ser 1.39 (*)  Calcium 8.7 (*)    GFR, Estimated 55 (*)    All other components within normal limits  CBC WITH DIFFERENTIAL/PLATELET - Abnormal; Notable for the following components:   RBC 2.96 (*)    Hemoglobin 8.0 (*)    HCT 26.3 (*)    Lymphs Abs 0.6 (*)    All other components within normal limits  URINALYSIS, ROUTINE W REFLEX MICROSCOPIC - Abnormal; Notable for the following components:   Color, Urine BLOODY (*)    APPearance CLOUDY (*)    Hgb urine dipstick LARGE (*)    Protein, ur 100 (*)    Nitrite POSITIVE (*)    Leukocytes,Ua SMALL (*)    RBC / HPF >50 (*)    WBC, UA >50 (*)    All other components within normal limits  TYPE AND SCREEN    EKG None  Radiology No results found.  Procedures .Critical Care Performed by: Varney Biles, MD Authorized by: Varney Biles, MD   Critical care provider statement:    Critical care time (minutes):  45   Critical care was necessary to treat or prevent imminent or life-threatening deterioration of the following conditions:  Renal failure   Critical care was time spent personally by me on the following activities:  Discussions with consultants, evaluation of patient's response to treatment, examination of patient, ordering and performing treatments and interventions, ordering and review of laboratory studies, ordering and review of radiographic studies, pulse oximetry, re-evaluation of patient's  condition, obtaining history from patient or surrogate and review of old charts     Medications Ordered in ED Medications - No data to display  ED Course  I have reviewed the triage vital signs and the nursing notes.  Pertinent labs & imaging results that were available during my care of the patient were reviewed by me and considered in my medical decision making (see chart for details).    MDM Rules/Calculators/A&P                          70 year old male comes in a chief complaint of bloody urine.  He is also having pain with urination and urinary frequency.  All of the symptoms have been present now for several weeks.  Patient has had 2 rounds of antibiotics through ER, his cultures were negative.  He did have fevers on 1 of those occasions even.  Patient states that his symptoms have progressed.  He supposed to see urologist in 2 weeks but cannot wait.  Differential diagnosis includes cystitis from infection, cystitis from radiation, new tumor.  Bladder scan ordered and there is no urinary retention. Appropriate labs ordered.  I spoke with Dr. Junious Silk, urology.  He reports that the treatment will be conservative.  Patient can get oxybutynin 5 mg twice daily in addition to the gym to has a he is taking for symptom management.  Those medications can cause dry mouth however.  4:11 PM Patient's hemoglobin is 8.  It has dropped 2 g in the last month.  He is having gross hematuria right now.  Patient reports dizziness that is positional only.  He is not on any blood thinners.  Plan is to admit him for observation.  We spoke with urology, they think that they are management would not change because of dropping hemoglobin.  Medicine to admit for observation and possible transfusion.  Final Clinical Impression(s) / ED Diagnoses Final diagnoses:  Gross hematuria    Rx / DC Orders  ED Discharge Orders    None       Varney Biles, MD 02/26/21 [phone]

## 2021-02-27 LAB — BPAM RBC
Blood Product Expiration Date: 202204192359
ISSUE DATE / TIME: 202204161713
Unit Type and Rh: 6200

## 2021-02-27 LAB — SARS CORONAVIRUS 2 (TAT 6-24 HRS): SARS Coronavirus 2: NEGATIVE

## 2021-02-27 LAB — TYPE AND SCREEN
ABO/RH(D): AB POS
Antibody Screen: NEGATIVE
Unit division: 0

## 2021-02-28 ENCOUNTER — Telehealth: Payer: Self-pay | Admitting: *Deleted

## 2021-02-28 MED FILL — Oxycodone w/ Acetaminophen Tab 5-325 MG: ORAL | Qty: 6 | Status: AC

## 2021-02-28 NOTE — Telephone Encounter (Signed)
Transition Care Management Follow-up Telephone Call  Date of discharge and from where: 02/26/2021 - [facility]  How have you been since you were released from the hospital? "Fine"  Any questions or concerns? No  Items Reviewed:  Did the pt receive and understand the discharge instructions provided? Yes   Medications obtained and verified? Yes   Other? No   Any new allergies since your discharge? No   Dietary orders reviewed? No  Do you have support at home? Yes    Functional Questionnaire: (I = Independent and D = Dependent) ADLs: I  Bathing/Dressing- I  Meal Prep- I  Eating- I  Maintaining continence- I  Transferring/Ambulation- I  Managing Meds- I  Follow up appointments reviewed:   PCP Hospital f/u appt confirmed? No    Specialist Hospital f/u appt confirmed? Yes  Scheduled to see Urology on 03/11/2021 @ 1000.  Are transportation arrangements needed? No   If their condition worsens, is the pt aware to call PCP or go to the Emergency Dept.? Yes  Was the patient provided with contact information for the PCP's office or ED? Yes  Was to pt encouraged to call back with questions or concerns? Yes

## 2021-03-02 ENCOUNTER — Telehealth: Payer: Self-pay

## 2021-03-02 NOTE — Telephone Encounter (Signed)
Pt called wanting samples of Gemtesa. Dr. Alyson Ingles gave permission to give 4 boxes to pt. Samples were left up front and called pt and left message on phone.

## 2021-03-04 ENCOUNTER — Telehealth: Payer: Self-pay

## 2021-03-04 ENCOUNTER — Other Ambulatory Visit: Payer: Self-pay

## 2021-03-04 ENCOUNTER — Other Ambulatory Visit: Payer: Medicare Other

## 2021-03-04 DIAGNOSIS — N138 Other obstructive and reflux uropathy: Secondary | ICD-10-CM

## 2021-03-04 DIAGNOSIS — N3281 Overactive bladder: Secondary | ICD-10-CM

## 2021-03-04 LAB — URINALYSIS, ROUTINE W REFLEX MICROSCOPIC
Bilirubin, UA: NEGATIVE
Nitrite, UA: POSITIVE — AB
Specific Gravity, UA: 1.01 (ref 1.005–1.030)
Urobilinogen, Ur: 2 mg/dL — ABNORMAL HIGH (ref 0.2–1.0)
pH, UA: 6 (ref 5.0–7.5)

## 2021-03-04 LAB — MICROSCOPIC EXAMINATION
Renal Epithel, UA: NONE SEEN /hpf
WBC, UA: >30 /hpf — AB (ref 0–5)

## 2021-03-04 MED ORDER — NITROFURANTOIN MONOHYD MACRO 100 MG PO CAPS: 100.0000 mg | ORAL_CAPSULE | Freq: Two times a day (BID) | ORAL | 0 refills | Status: DC

## 2021-03-04 NOTE — Telephone Encounter (Signed)
Pt gave urine specimen while here and given antibiotic per Dr. Alyson Ingles.

## 2021-03-06 LAB — URINE CULTURE

## 2021-03-09 ENCOUNTER — Telehealth: Payer: Self-pay

## 2021-03-09 NOTE — Telephone Encounter (Signed)
Patient notified of results.

## 2021-03-09 NOTE — Telephone Encounter (Signed)
-----   Message from Cleon Gustin, MD sent at 03/08/2021  8:11 AM EDT ----- negative ----- Message ----- From: Dorisann Frames, RN Sent: 03/07/2021   9:32 AM EDT To: Cleon Gustin, MD  Please review. Urine drop off

## 2021-03-11 ENCOUNTER — Other Ambulatory Visit: Payer: Medicare Other | Admitting: Urology

## 2021-03-16 ENCOUNTER — Encounter: Payer: Self-pay | Admitting: Urology

## 2021-03-16 ENCOUNTER — Other Ambulatory Visit: Payer: Self-pay

## 2021-03-16 ENCOUNTER — Ambulatory Visit (INDEPENDENT_AMBULATORY_CARE_PROVIDER_SITE_OTHER): Payer: Medicare Other | Admitting: Urology

## 2021-03-16 VITALS — BP 150/84 | HR 93

## 2021-03-16 DIAGNOSIS — C61 Malignant neoplasm of prostate: Secondary | ICD-10-CM | POA: Diagnosis not present

## 2021-03-16 LAB — URINALYSIS, ROUTINE W REFLEX MICROSCOPIC
Bilirubin, UA: NEGATIVE
Nitrite, UA: POSITIVE — AB
Specific Gravity, UA: 1.01 (ref 1.005–1.030)
Urobilinogen, Ur: 2 mg/dL — ABNORMAL HIGH (ref 0.2–1.0)
pH, UA: 5 (ref 5.0–7.5)

## 2021-03-16 LAB — MICROSCOPIC EXAMINATION
Epithelial Cells (non renal): NONE SEEN /hpf (ref 0–10)
Renal Epithel, UA: NONE SEEN /hpf
WBC, UA: >30 /hpf — AB (ref 0–5)

## 2021-03-16 MED ORDER — CIPROFLOXACIN HCL 500 MG PO TABS
500.0000 mg | ORAL_TABLET | Freq: Once | ORAL | Status: AC
  Administered 2021-03-16: 500 mg via ORAL

## 2021-03-16 NOTE — Progress Notes (Signed)
Urological Symptom Review  Patient is experiencing the following symptoms: Frequent urination Burning/pain with urination Get up at night to urinate Have to strain to urinate Urinary tract infection   Review of Systems  Gastrointestinal (upper)  : Negative for upper GI symptoms  Gastrointestinal (lower) : Negative for lower GI symptoms  Constitutional : Weight loss Fatigue  Skin: Negative for skin symptoms  Eyes: Negative for eye symptoms  Ear/Nose/Throat : Negative for Ear/Nose/Throat symptoms  Hematologic/Lymphatic: Negative for Hematologic/Lymphatic symptoms  Cardiovascular : Negative for cardiovascular symptoms  Respiratory : Negative for respiratory symptoms  Endocrine: Negative for endocrine symptoms  Musculoskeletal: Negative for musculoskeletal symptoms  Neurological: Negative for neurological symptoms  Psychologic: Negative for psychiatric symptoms

## 2021-03-21 ENCOUNTER — Other Ambulatory Visit: Payer: Self-pay

## 2021-03-21 ENCOUNTER — Other Ambulatory Visit: Payer: Self-pay | Admitting: Urology

## 2021-03-21 ENCOUNTER — Ambulatory Visit (INDEPENDENT_AMBULATORY_CARE_PROVIDER_SITE_OTHER): Payer: Medicare Other

## 2021-03-21 ENCOUNTER — Encounter: Payer: Self-pay | Admitting: Urology

## 2021-03-21 DIAGNOSIS — R310 Gross hematuria: Secondary | ICD-10-CM | POA: Diagnosis not present

## 2021-03-21 LAB — BLADDER SCAN AMB NON-IMAGING: Scan Result: 183

## 2021-03-21 MED ORDER — CEFPODOXIME PROXETIL 200 MG PO TABS: 200.0000 mg | ORAL_TABLET | Freq: Two times a day (BID) | ORAL | 0 refills | Status: DC

## 2021-03-21 MED ORDER — OXYCODONE-ACETAMINOPHEN 5-325 MG PO TABS: 1.0000 | ORAL_TABLET | Freq: Four times a day (QID) | ORAL | 0 refills | Status: DC | PRN

## 2021-03-21 NOTE — Progress Notes (Signed)
Patient here today complaining of passing large clots over weekend making it difficult for patient to void-   Patient brought large clot with him to office- size of a quarter.  PVR- 183  Patient unable to void at this time.   Pt is prepped for and in and out catherization. Patient was cleaned and prepped in a sterle fashion with betadine. A 14 fr catheter foley was inserted. Urine return was note 120 ml.  Performed by Lurline Hare  Urine sent culture  New Rx sent to [facility] for Vantin 200mg  BID x 7 days. Patient aware

## 2021-03-22 ENCOUNTER — Encounter: Payer: Self-pay | Admitting: Urology

## 2021-03-22 NOTE — Progress Notes (Signed)
   03/16/2021  CC: followup prostate cancer  HPI: Mr Soulliere is a 70yo here for followup for prostate cancer  Blood pressure (!) 150/84, pulse 93. NED. A&Ox3.   No respiratory distress   Abd soft, NT, ND Normal phallus with bilateral descended testicles  Cystoscopy Procedure Note  Patient identification was confirmed, informed consent was obtained, and patient was prepped using Betadine solution.  Lidocaine jelly was administered per urethral meatus.     Pre-Procedure: - Inspection reveals a normal caliber ureteral meatus.  Procedure: The flexible cystoscope was introduced without difficulty - No urethral strictures/lesions are present. - Normal prostate  - Normal bladder neck - Bilateral ureteral orifices identified - Bladder mucosa  reveals no ulcers, tumors, or lesions - No bladder stones - No trabeculation  Retroflexion shows no intravesical prostatic protrusion   Post-Procedure: - Patient tolerated the procedure well  Assessment/ Plan: Followup 3 months with PSA  No follow-ups on file.  [name], MD

## 2021-03-22 NOTE — Patient Instructions (Signed)
Prostate Cancer  The prostate is a male gland that helps make semen. It is located below a man's bladder, in front of the rectum. Prostate cancer is when abnormal cells grow in this gland. What are the causes? The cause of this condition is not known. What increases the risk? You are more likely to develop this condition if:  You are 70 years of age or older.  You are African American.  You have a family history of prostate cancer.  You have a family history of breast cancer. What are the signs or symptoms? Symptoms of this condition include:  A need to pee often.  Peeing that is weak, or pee that stops and starts.  Trouble starting or stopping your pee.  Inability to pee.  Blood in your pee or semen.  Pain in the lower back, lower belly (abdomen), hips, or upper thighs.  Trouble getting an erection.  Trouble emptying all of your pee. How is this treated? Treatment for this condition depends on your age, your health, the kind of treatment you like, and how far the cancer has spread. Treatments include:  Being watched. This is called observation. You will be tested from time to time, but you will not get treated. Tests are to make sure that the cancer is not growing.  Surgery. This may be done to remove the prostate, to remove the testicles, or to freeze or kill cancer cells.  Radiation. This uses a strong beam to kill cancer cells.  Ultrasound energy. This uses strong sound waves to kill cancer cells.  Chemotherapy. This uses medicines that stop cancer cells from increasing. This kills cancer cells and healthy cells.  Targeted therapy. This kills cancer cells only. Healthy cells are not affected.  Hormone treatment. This stops the body from making hormones that help the cancer cells to grow. Follow these instructions at home:  Take over-the-counter and prescription medicines only as told by your doctor.  Eat a healthy diet.  Get plenty of sleep.  Ask your  doctor for help to find a support group for men with prostate cancer.  If you have to go to the hospital, let your cancer doctor (oncologist) know.  Treatment may affect your ability to have sex. Touch, hold, hug, and caress your partner to have intimate moments.  Keep all follow-up visits as told by your doctor. This is important. Contact a doctor if:  You have new or more trouble peeing.  You have new or more blood in your pee.  You have new or more pain in your hips, back, or chest. Get help right away if:  You have weakness in your legs.  You lose feeling in your legs.  You cannot control your pee or your poop (stool).  You have chills or a fever. Summary  The prostate is a male gland that helps make semen.  Prostate cancer is when abnormal cells grow in this gland.  Treatment includes doing surgery, using medicines, using very strong beams, or watching without treatment.  Ask your doctor for help to find a support group for men with prostate cancer.  Contact a doctor if you have problems peeing or have any new pain that you did not have before. This information is not intended to replace advice given to you by your health care provider. Make sure you discuss any questions you have with your health care provider. Document Revised: 10/14/2019 Document Reviewed: 10/14/2019 Elsevier Patient Education  2021 Elsevier Inc.  

## 2021-03-23 LAB — URINE CULTURE

## 2021-03-28 ENCOUNTER — Telehealth: Payer: Self-pay

## 2021-03-28 NOTE — Telephone Encounter (Signed)
Patient called this am. Patient was given vantin for 7 days for hematuria while culture pended.  Patient reports while on medication all blood/blood clots stopped.  Patient completed antibiotic course Friday and Sunday blood clots returned per pt.  Urine culture negative.  Message sent to MD for advise.

## 2021-03-29 ENCOUNTER — Other Ambulatory Visit: Payer: Self-pay

## 2021-03-29 DIAGNOSIS — R310 Gross hematuria: Secondary | ICD-10-CM

## 2021-03-29 MED ORDER — CEFPODOXIME PROXETIL 200 MG PO TABS: 200.0000 mg | ORAL_TABLET | Freq: Two times a day (BID) | ORAL | 0 refills | Status: DC

## 2021-03-29 NOTE — Telephone Encounter (Signed)
Patient is still currently taking finasteride.

## 2021-03-29 NOTE — Telephone Encounter (Signed)
As discussed in office- rx sent in for vantin 200mg  po bid for 21 days.

## 2021-03-29 NOTE — Progress Notes (Signed)
Order received from Dr. Alyson Ingles for Tony Powell 200mg  BID x 21 days for recurrent hematuria with blood clots.  Prescription sent to pharmacy. Patient called and notified. Voiced understanding.

## 2021-04-05 ENCOUNTER — Other Ambulatory Visit: Payer: Self-pay

## 2021-04-05 MED ORDER — TAMSULOSIN HCL 0.4 MG PO CAPS: 0.4000 mg | ORAL_CAPSULE | Freq: Every day | ORAL | 1 refills | Status: DC

## 2021-04-06 ENCOUNTER — Encounter: Payer: Self-pay | Admitting: Urology

## 2021-04-13 NOTE — Patient Instructions (Signed)
Botulinum Toxin Bladder Injection  A botulinum toxin bladder injection is a procedure to treat an overactive bladder. During the procedure, a drug called botulinum toxin is injected into the bladder through a long, thin needle. This drug relaxes the bladder muscles and reduces overactivity. You may need this procedure if your medicines are not working or you cannot take them. The procedure may be repeated as needed. The treatment usually lasts for 6 months. Your health care provider will monitor you to see how well you respond. Tell a health care provider about:  Any allergies you have.  All medicines you are taking, including vitamins, herbs, eye drops, creams, and over-the-counter medicines.  Any problems you or family members have had with anesthetic medicines.  Any blood disorders you have.  Any surgeries you have had.  Any medical conditions you have.  Any previous reactions to a botulinum toxin injection.  Any symptoms of urinary tract infection. These include chills, fever, a burning feeling when passing urine, and needing to pass urine often.  Whether you are pregnant or may be pregnant. What are the risks? Generally this is a safe procedure. However, problems may occur, including:  Not being able to pass urine. If this happens, you may need to have your bladder emptied with a thin tube inserted into your urethra (urinary catheter).  Bleeding.  Urinary tract infection.  Allergic reaction to the botulinum toxin.  Pain or burning when passing urine.  Damage to other structures or organs. What happens before the procedure?      Tony Powell  04/13/2021     @PREFPERIOPPHARMACY @   Your procedure is scheduled on  04/18/2021   Report to [facility] at  1200  P.M.   Call this number if you have problems the morning of surgery:  [phone]   Remember:  Do not eat or drink after midnight.                         Take these medicines the morning of  surgery with A SIP OF WATER  Amlodipine, proscar, ditropan, oxycodone (if needed), puridium (if needed), flomax, gemtesa.     Please brush your teeth.  Do not wear jewelry, make-up or nail polish.  Do not wear lotions, powders, or perfumes, or deodorant.  Do not shave 48 hours prior to surgery.  Men may shave face and neck.  Do not bring valuables to the hospital.  [facility] is not responsible for any belongings or valuables.  Contacts, dentures or bridgework may not be worn into surgery.  Leave your suitcase in the car.  After surgery it may be brought to your room.  For patients admitted to the hospital, discharge time will be determined by your treatment team.  Patients discharged the day of surgery will not be allowed to drive home and must have someone with them for 24 hours.    Special instructions:  DO NOT smoke tobacco or vape for 24 hours before your procedure.  Please read over the following fact sheets that you were given. Coughing and Deep Breathing, Surgical Site Infection Prevention, Anesthesia Post-op Instructions and Care and Recovery After Surgery      Ask your health care provider about:  Changing or stopping your regular medicines. This is especially important if you are taking diabetes medicines or blood thinners.  Taking medicines such as aspirin and ibuprofen. These medicines can thin your blood. Do not take these medicines  unless your health care provider tells you to take them.  Taking over-the-counter medicines, vitamins, herbs, and supplements. General instructions  Plan to have someone take you home from the hospital or clinic.  If you will be going home right after the procedure, plan to have someone with you for 24 hours.  Ask your health care provider what steps will be taken to help prevent infection. These may include: ? Removing hair at the procedure site. ? Washing skin with a germ-killing soap. ? Antibiotic medicine. What happens during  the procedure?  You will be asked to empty your bladder.  An IV will be inserted into one of your veins.  You will be given one or more of the following: ? A medicine to help you relax (sedative). ? A medicine to numb the area (local anesthetic). ? A medicine to make you fall asleep (general anesthetic).  A long, thin scope called a cystoscope will be passed into your bladder through the part of the body that carries urine from your bladder (urethra).  The cystoscope will be used to fill your bladder with water.  A long needle will be passed through the cystoscope and into the bladder.  The botulinum toxin will be injected into your bladder. It may be injected into multiple areas of your bladder.  Your bladder will be emptied, and the cystoscope will be removed. The procedure may vary among health care providers and hospitals.   What can I expect after procedure? After your procedure, it is common to have:  Blood-tinged urine.  Burning or soreness when you pass urine. Follow these instructions at home: Medicines  Take over-the-counter and prescription medicines only as told by your health care provider.  If you were prescribed an antibiotic medicine, take it as told by your health care provider. Do not stop taking the antibiotic even if you start to feel better. General instructions  Do not drive for 24 hours if you were given a sedative during your procedure.  Drink enough fluid to keep your urine pale yellow.  Return to your normal activities as told by your health care provider. Ask your health care provider what activities are safe for you.  Keep all follow-up visits as told by your health care provider. This is important.   Contact a health care provider if you have:  A fever or chills.  Blood-tinged urine for more than one day after your procedure.  Worsening pain or burning when you pass urine.  Pain or burning when passing urine for more than two days after  your procedure.  Trouble emptying your bladder. Get help right away if you:  Have bright red blood in your urine.  Are unable to pass urine. Summary  A botulinum toxin bladder injection is a procedure to treat an overactive bladder.  This is generally a very safe procedure. However, problems may occur, including not being able to pass urine, bleeding, infection, pain, and allergic reactions to medicines.  You will be told when to stop eating and drinking, and what medicines to change or stop. Follow instructions carefully.  After the procedure, it is common to have blood in urine and to have soreness or burning when passing urine.  Contact a health care provider if you have a fever, have blood in urine for more than a few days, or have trouble passing urine. Get help right away if you have bright red blood in the urine, or if you are unable to pass urine. This information  is not intended to replace advice given to you by your health care provider. Make sure you discuss any questions you have with your health care provider. Document Revised: 05/10/2018 Document Reviewed: 05/10/2018 Elsevier Patient Education  2021 [place] Anesthesia, Adult, Care After This sheet gives you information about how to care for yourself after your procedure. Your health care provider may also give you more specific instructions. If you have problems or questions, contact your health care provider. What can I expect after the procedure? After the procedure, the following side effects are common:  Pain or discomfort at the IV site.  Nausea.  Vomiting.  Sore throat.  Trouble concentrating.  Feeling cold or chills.  Feeling weak or tired.  Sleepiness and fatigue.  Soreness and body aches. These side effects can affect parts of the body that were not involved in surgery. Follow these instructions at home: For the time period you were told by your health care provider:  Rest.  Do  not participate in activities where you could fall or become injured.  Do not drive or use machinery.  Do not drink alcohol.  Do not take sleeping pills or medicines that cause drowsiness.  Do not make important decisions or sign legal documents.  Do not take care of children on your own.   Eating and drinking  Follow any instructions from your health care provider about eating or drinking restrictions.  When you feel hungry, start by eating small amounts of foods that are soft and easy to digest (bland), such as toast. Gradually return to your regular diet.  Drink enough fluid to keep your urine pale yellow.  If you vomit, rehydrate by drinking water, juice, or clear broth. General instructions  If you have sleep apnea, surgery and certain medicines can increase your risk for breathing problems. Follow instructions from your health care provider about wearing your sleep device: ? Anytime you are sleeping, including during daytime naps. ? While taking prescription pain medicines, sleeping medicines, or medicines that make you drowsy.  Have a responsible adult stay with you for the time you are told. It is important to have someone help care for you until you are awake and alert.  Return to your normal activities as told by your health care provider. Ask your health care provider what activities are safe for you.  Take over-the-counter and prescription medicines only as told by your health care provider.  If you smoke, do not smoke without supervision.  Keep all follow-up visits as told by your health care provider. This is important. Contact a health care provider if:  You have nausea or vomiting that does not get better with medicine.  You cannot eat or drink without vomiting.  You have pain that does not get better with medicine.  You are unable to pass urine.  You develop a skin rash.  You have a fever.  You have redness around your IV site that gets worse. Get  help right away if:  You have difficulty breathing.  You have chest pain.  You have blood in your urine or stool, or you vomit blood. Summary  After the procedure, it is common to have a sore throat or nausea. It is also common to feel tired.  Have a responsible adult stay with you for the time you are told. It is important to have someone help care for you until you are awake and alert.  When you feel hungry, start by eating small amounts  of foods that are soft and easy to digest (bland), such as toast. Gradually return to your regular diet.  Drink enough fluid to keep your urine pale yellow.  Return to your normal activities as told by your health care provider. Ask your health care provider what activities are safe for you. This information is not intended to replace advice given to you by your health care provider. Make sure you discuss any questions you have with your health care provider. Document Revised: 07/15/2020 Document Reviewed: 02/12/2020 Elsevier Patient Education  2021 [employer]. How to Use Chlorhexidine for Bathing Chlorhexidine gluconate (CHG) is a germ-killing (antiseptic) solution that is used to clean the skin. It can get rid of the bacteria that normally live on the skin and can keep them away for about 24 hours. To clean your skin with CHG, you may be given:  A CHG solution to use in the shower or as part of a sponge bath.  A prepackaged cloth that contains CHG. Cleaning your skin with CHG may help lower the risk for infection:  While you are staying in the intensive care unit of the hospital.  If you have a vascular access, such as a central line, to provide short-term or long-term access to your veins.  If you have a catheter to drain urine from your bladder.  If you are on a ventilator. A ventilator is a machine that helps you breathe by moving air in and out of your lungs.  After surgery. What are the risks? Risks of using CHG include:  A skin  reaction.  Hearing loss, if CHG gets in your ears.  Eye injury, if CHG gets in your eyes and is not rinsed out.  The CHG product catching fire. Make sure that you avoid smoking and flames after applying CHG to your skin. Do not use CHG:  If you have a chlorhexidine allergy or have previously reacted to chlorhexidine.  On babies younger than 14 months of age. How to use CHG solution  Use CHG only as told by your health care provider, and follow the instructions on the label.  Use the full amount of CHG as directed. Usually, this is one bottle. During a shower Follow these steps when using CHG solution during a shower (unless your health care provider gives you different instructions): 1. Start the shower. 2. Use your normal soap and shampoo to wash your face and hair. 3. Turn off the shower or move out of the shower stream. 4. Pour the CHG onto a clean washcloth. Do not use any type of brush or rough-edged sponge. 5. Starting at your neck, lather your body down to your toes. Make sure you follow these instructions: ? If you will be having surgery, pay special attention to the part of your body where you will be having surgery. Scrub this area for at least 1 minute. ? Do not use CHG on your head or face. If the solution gets into your ears or eyes, rinse them well with water. ? Avoid your genital area. ? Avoid any areas of skin that have broken skin, cuts, or scrapes. ? Scrub your back and under your arms. Make sure to wash skin folds. 6. Let the lather sit on your skin for 1-2 minutes or as long as told by your health care provider. 7. Thoroughly rinse your entire body in the shower. Make sure that all body creases and crevices are rinsed well. 8. Dry off with a clean towel. Do not  put any substances on your body afterward--such as powder, lotion, or perfume--unless you are told to do so by your health care provider. Only use lotions that are recommended by the manufacturer. 9. Put on  clean clothes or pajamas. 10. If it is the night before your surgery, sleep in clean sheets.   During a sponge bath Follow these steps when using CHG solution during a sponge bath (unless your health care provider gives you different instructions): 1. Use your normal soap and shampoo to wash your face and hair. 2. Pour the CHG onto a clean washcloth. 3. Starting at your neck, lather your body down to your toes. Make sure you follow these instructions: ? If you will be having surgery, pay special attention to the part of your body where you will be having surgery. Scrub this area for at least 1 minute. ? Do not use CHG on your head or face. If the solution gets into your ears or eyes, rinse them well with water. ? Avoid your genital area. ? Avoid any areas of skin that have broken skin, cuts, or scrapes. ? Scrub your back and under your arms. Make sure to wash skin folds. 4. Let the lather sit on your skin for 1-2 minutes or as long as told by your health care provider. 5. Using a different clean, wet washcloth, thoroughly rinse your entire body. Make sure that all body creases and crevices are rinsed well. 6. Dry off with a clean towel. Do not put any substances on your body afterward--such as powder, lotion, or perfume--unless you are told to do so by your health care provider. Only use lotions that are recommended by the manufacturer. 7. Put on clean clothes or pajamas. 8. If it is the night before your surgery, sleep in clean sheets. How to use CHG prepackaged cloths  Only use CHG cloths as told by your health care provider, and follow the instructions on the label.  Use the CHG cloth on clean, dry skin.  Do not use the CHG cloth on your head or face unless your health care provider tells you to.  When washing with the CHG cloth: ? Avoid your genital area. ? Avoid any areas of skin that have broken skin, cuts, or scrapes. Before surgery Follow these steps when using a CHG cloth to  clean before surgery (unless your health care provider gives you different instructions): 1. Using the CHG cloth, vigorously scrub the part of your body where you will be having surgery. Scrub using a back-and-forth motion for 3 minutes. The area on your body should be completely wet with CHG when you are done scrubbing. 2. Do not rinse. Discard the cloth and let the area air-dry. Do not put any substances on the area afterward, such as powder, lotion, or perfume. 3. Put on clean clothes or pajamas. 4. If it is the night before your surgery, sleep in clean sheets.   For general bathing Follow these steps when using CHG cloths for general bathing (unless your health care provider gives you different instructions). 1. Use a separate CHG cloth for each area of your body. Make sure you wash between any folds of skin and between your fingers and toes. Wash your body in the following order, switching to a new cloth after each step: ? The front of your neck, shoulders, and chest. ? Both of your arms, under your arms, and your hands. ? Your stomach and groin area, avoiding the genitals. ? Your right leg  and foot. ? Your left leg and foot. ? The back of your neck, your back, and your buttocks. 2. Do not rinse. Discard the cloth and let the area air-dry. Do not put any substances on your body afterward--such as powder, lotion, or perfume--unless you are told to do so by your health care provider. Only use lotions that are recommended by the manufacturer. 3. Put on clean clothes or pajamas. Contact a health care provider if:  Your skin gets irritated after scrubbing.  You have questions about using your solution or cloth. Get help right away if:  Your eyes become very red or swollen.  Your eyes itch badly.  Your skin itches badly and is red or swollen.  Your hearing changes.  You have trouble seeing.  You have swelling or tingling in your mouth or throat.  You have trouble breathing.  You  swallow any chlorhexidine. Summary  Chlorhexidine gluconate (CHG) is a germ-killing (antiseptic) solution that is used to clean the skin. Cleaning your skin with CHG may help to lower your risk for infection.  You may be given CHG to use for bathing. It may be in a bottle or in a prepackaged cloth to use on your skin. Carefully follow your health care provider's instructions and the instructions on the product label.  Do not use CHG if you have a chlorhexidine allergy.  Contact your health care provider if your skin gets irritated after scrubbing. This information is not intended to replace advice given to you by your health care provider. Make sure you discuss any questions you have with your health care provider. Document Revised: 04/16/2020 Document Reviewed: 04/16/2020 Elsevier Patient Education  [place].

## 2021-04-14 ENCOUNTER — Encounter (HOSPITAL_COMMUNITY)
Admission: RE | Admit: 2021-04-14 | Discharge: 2021-04-14 | Disposition: A | Discharge status: Home / Self Care | Payer: Medicare Other | Source: Ambulatory Visit | Attending: Urology | Admitting: Urology

## 2021-04-14 ENCOUNTER — Encounter (HOSPITAL_COMMUNITY): Payer: Self-pay

## 2021-04-14 ENCOUNTER — Other Ambulatory Visit: Payer: Self-pay

## 2021-04-14 ENCOUNTER — Other Ambulatory Visit (HOSPITAL_COMMUNITY): Payer: Medicare Other | Attending: Urology

## 2021-04-14 DIAGNOSIS — Z01812 Encounter for preprocedural laboratory examination: Secondary | ICD-10-CM | POA: Diagnosis not present

## 2021-04-14 LAB — CBC WITH DIFFERENTIAL/PLATELET
Abs Immature Granulocytes: 0.02 10*3/uL (ref 0.00–0.07)
Basophils Absolute: 0 10*3/uL (ref 0.0–0.1)
Basophils Relative: 1 %
Eosinophils Absolute: 0.1 10*3/uL (ref 0.0–0.5)
Eosinophils Relative: 1 %
HCT: 33.4 % — ABNORMAL LOW (ref 39.0–52.0)
Hemoglobin: 10.4 g/dL — ABNORMAL LOW (ref 13.0–17.0)
Immature Granulocytes: 0 %
Lymphocytes Relative: 16 %
Lymphs Abs: 0.9 10*3/uL (ref 0.7–4.0)
MCH: 27 pg (ref 26.0–34.0)
MCHC: 31.1 g/dL (ref 30.0–36.0)
MCV: 86.8 fL (ref 80.0–100.0)
Monocytes Absolute: 0.6 10*3/uL (ref 0.1–1.0)
Monocytes Relative: 10 %
Neutro Abs: 3.9 10*3/uL (ref 1.7–7.7)
Neutrophils Relative %: 72 %
Platelets: 282 10*3/uL (ref 150–400)
RBC: 3.85 MIL/uL — ABNORMAL LOW (ref 4.22–5.81)
RDW: 14.7 % (ref 11.5–15.5)
WBC: 5.5 10*3/uL (ref 4.0–10.5)
nRBC: 0 % (ref 0.0–0.2)

## 2021-04-14 LAB — BASIC METABOLIC PANEL
Anion gap: 8 (ref 5–15)
BUN: 8 mg/dL (ref 8–23)
CO2: 22 mmol/L (ref 22–32)
Calcium: 9.1 mg/dL (ref 8.9–10.3)
Chloride: 107 mmol/L (ref 98–111)
Creatinine, Ser: 1.2 mg/dL (ref 0.61–1.24)
GFR, Estimated: >60 mL/min (ref >60)
Glucose, Bld: 95 mg/dL (ref 70–99)
Potassium: 3.7 mmol/L (ref 3.5–5.1)
Sodium: 137 mmol/L (ref 135–145)

## 2021-04-14 NOTE — Progress Notes (Signed)
Please excuse Tony Powell from work this morning. The patient had to come to our office for a pre op appointment.

## 2021-04-18 ENCOUNTER — Ambulatory Visit (HOSPITAL_COMMUNITY): Payer: Medicare Other | Admitting: Anesthesiology

## 2021-04-18 ENCOUNTER — Ambulatory Visit (HOSPITAL_COMMUNITY)
Admission: RE | Admit: 2021-04-18 | Discharge: 2021-04-18 | Disposition: A | Discharge status: Home / Self Care | Payer: Medicare Other | Attending: Urology | Admitting: Urology

## 2021-04-18 ENCOUNTER — Encounter (HOSPITAL_COMMUNITY): Payer: Self-pay | Admitting: Urology

## 2021-04-18 ENCOUNTER — Encounter (HOSPITAL_COMMUNITY)
Admission: RE | Disposition: A | Discharge status: Home / Self Care | Payer: Self-pay | Source: Home / Self Care | Attending: Urology

## 2021-04-18 DIAGNOSIS — T66XXXA Radiation sickness, unspecified, initial encounter: Secondary | ICD-10-CM | POA: Insufficient documentation

## 2021-04-18 DIAGNOSIS — Z806 Family history of leukemia: Secondary | ICD-10-CM | POA: Diagnosis not present

## 2021-04-18 DIAGNOSIS — Y842 Radiological procedure and radiotherapy as the cause of abnormal reaction of the patient, or of later complication, without mention of misadventure at the time of the procedure: Secondary | ICD-10-CM | POA: Diagnosis not present

## 2021-04-18 DIAGNOSIS — Z8249 Family history of ischemic heart disease and other diseases of the circulatory system: Secondary | ICD-10-CM | POA: Diagnosis not present

## 2021-04-18 DIAGNOSIS — Z923 Personal history of irradiation: Secondary | ICD-10-CM | POA: Insufficient documentation

## 2021-04-18 DIAGNOSIS — Z87891 Personal history of nicotine dependence: Secondary | ICD-10-CM | POA: Diagnosis not present

## 2021-04-18 DIAGNOSIS — Z809 Family history of malignant neoplasm, unspecified: Secondary | ICD-10-CM | POA: Diagnosis not present

## 2021-04-18 DIAGNOSIS — Z8546 Personal history of malignant neoplasm of prostate: Secondary | ICD-10-CM | POA: Insufficient documentation

## 2021-04-18 DIAGNOSIS — Z803 Family history of malignant neoplasm of breast: Secondary | ICD-10-CM | POA: Diagnosis not present

## 2021-04-18 DIAGNOSIS — R310 Gross hematuria: Secondary | ICD-10-CM | POA: Diagnosis not present

## 2021-04-18 DIAGNOSIS — Z886 Allergy status to analgesic agent status: Secondary | ICD-10-CM | POA: Insufficient documentation

## 2021-04-18 DIAGNOSIS — N3281 Overactive bladder: Secondary | ICD-10-CM | POA: Insufficient documentation

## 2021-04-18 HISTORY — PX: CYSTOSCOPY WITH FULGERATION: SHX6638

## 2021-04-18 HISTORY — PX: CYSTOSCOPY WITH INJECTION: SHX1424

## 2021-04-18 SURGERY — CYSTOSCOPY, WITH INJECTION OF BLADDER NECK OR BLADDER WALL
Anesthesia: General | Site: Bladder

## 2021-04-18 MED ORDER — ONDANSETRON HCL 4 MG/2ML IJ SOLN
INTRAMUSCULAR | Status: AC
  Filled 2021-04-18: qty 2

## 2021-04-18 MED ORDER — PROPOFOL 10 MG/ML IV BOLUS
INTRAVENOUS | Status: DC | PRN
  Administered 2021-04-18: 50 mg via INTRAVENOUS
  Administered 2021-04-18: 40 mg via INTRAVENOUS
  Administered 2021-04-18: 50 mg via INTRAVENOUS

## 2021-04-18 MED ORDER — ONABOTULINUMTOXINA 100 UNITS IJ SOLR
100.0000 [IU] | Freq: Once | INTRAMUSCULAR | Status: DC
  Filled 2021-04-18: qty 100

## 2021-04-18 MED ORDER — FENTANYL CITRATE (PF) 100 MCG/2ML IJ SOLN
25.0000 ug | INTRAMUSCULAR | Status: DC | PRN
  Administered 2021-04-18 (×3): 50 ug via INTRAVENOUS
  Filled 2021-04-18 (×2): qty 2

## 2021-04-18 MED ORDER — ROCURONIUM BROMIDE 10 MG/ML (PF) SYRINGE
PREFILLED_SYRINGE | INTRAVENOUS | Status: AC
  Filled 2021-04-18: qty 20

## 2021-04-18 MED ORDER — PROPOFOL 10 MG/ML IV BOLUS
INTRAVENOUS | Status: AC
  Filled 2021-04-18: qty 60

## 2021-04-18 MED ORDER — EPHEDRINE 5 MG/ML INJ
INTRAVENOUS | Status: AC
  Filled 2021-04-18: qty 10

## 2021-04-18 MED ORDER — PROPOFOL 500 MG/50ML IV EMUL
INTRAVENOUS | Status: DC | PRN
  Administered 2021-04-18: 75 ug/kg/min via INTRAVENOUS

## 2021-04-18 MED ORDER — ONABOTULINUMTOXINA 200 UNITS IJ SOLR
INTRAMUSCULAR | Status: DC | PRN
  Administered 2021-04-18: 100 [IU] via SURGICAL_CAVITY

## 2021-04-18 MED ORDER — PROPOFOL 10 MG/ML IV BOLUS
INTRAVENOUS | Status: AC
  Filled 2021-04-18: qty 20

## 2021-04-18 MED ORDER — OXYCODONE HCL 5 MG PO TABS
ORAL_TABLET | ORAL | Status: AC
  Filled 2021-04-18: qty 2

## 2021-04-18 MED ORDER — LIDOCAINE HCL (CARDIAC) PF 100 MG/5ML IV SOSY
PREFILLED_SYRINGE | INTRAVENOUS | Status: DC | PRN
  Administered 2021-04-18: 40 mg via INTRAVENOUS

## 2021-04-18 MED ORDER — WATER FOR IRRIGATION, STERILE IR SOLN
Status: DC | PRN
  Administered 2021-04-18: 3000 mL
  Administered 2021-04-18: 500 mL

## 2021-04-18 MED ORDER — CEFAZOLIN SODIUM-DEXTROSE 2-4 GM/100ML-% IV SOLN
INTRAVENOUS | Status: AC
  Filled 2021-04-18: qty 100

## 2021-04-18 MED ORDER — CHLORHEXIDINE GLUCONATE 0.12 % MT SOLN: 15.0000 mL | Freq: Once | OROMUCOSAL | Status: AC

## 2021-04-18 MED ORDER — LIDOCAINE HCL URETHRAL/MUCOSAL 2 % EX GEL
CUTANEOUS | Status: AC
  Filled 2021-04-18: qty 10

## 2021-04-18 MED ORDER — ONDANSETRON HCL 4 MG/2ML IJ SOLN: 4.0000 mg | Freq: Once | INTRAMUSCULAR | Status: DC | PRN

## 2021-04-18 MED ORDER — OXYCODONE HCL 5 MG PO TABS
10.0000 mg | ORAL_TABLET | Freq: Once | ORAL | Status: AC
  Administered 2021-04-18: 10 mg via ORAL

## 2021-04-18 MED ORDER — ORAL CARE MOUTH RINSE: 15.0000 mL | Freq: Once | OROMUCOSAL | Status: AC

## 2021-04-18 MED ORDER — SODIUM CHLORIDE (PF) 0.9 % IJ SOLN
INTRAMUSCULAR | Status: AC
  Filled 2021-04-18: qty 20

## 2021-04-18 MED ORDER — CHLORHEXIDINE GLUCONATE 0.12 % MT SOLN
OROMUCOSAL | Status: AC
  Administered 2021-04-18: 15 mL via OROMUCOSAL
  Filled 2021-04-18: qty 15

## 2021-04-18 MED ORDER — CEFAZOLIN SODIUM-DEXTROSE 2-4 GM/100ML-% IV SOLN
2.0000 g | INTRAVENOUS | Status: AC
  Administered 2021-04-18: 2 g via INTRAVENOUS

## 2021-04-18 MED ORDER — OXYCODONE HCL 5 MG PO TABS: 5.0000 mg | ORAL_TABLET | Freq: Four times a day (QID) | ORAL | 0 refills | Status: DC | PRN

## 2021-04-18 MED ORDER — SODIUM CHLORIDE (PF) 0.9 % IJ SOLN
INTRAMUSCULAR | Status: DC | PRN
  Administered 2021-04-18: 20 mL via INTRAVENOUS

## 2021-04-18 MED ORDER — LACTATED RINGERS IV SOLN: INTRAVENOUS | Status: DC

## 2021-04-18 SURGICAL SUPPLY — 19 items
BAG DRAIN URO TABLE W/ADPT NS (BAG) ×3 IMPLANT
BAG DRN 8 ADPR NS SKTRN CSTL (BAG) ×2
BAG HAMPER (MISCELLANEOUS) ×3 IMPLANT
CLOTH BEACON ORANGE TIMEOUT ST (SAFETY) ×3 IMPLANT
GLOVE BIO SURGEON STRL SZ8 (GLOVE) ×3 IMPLANT
GLOVE SURG UNDER POLY LF SZ7 (GLOVE) ×6 IMPLANT
GOWN STRL REUS W/TWL LRG LVL3 (GOWN DISPOSABLE) ×3 IMPLANT
GOWN STRL REUS W/TWL XL LVL3 (GOWN DISPOSABLE) ×3 IMPLANT
KIT TURNOVER CYSTO (KITS) ×3 IMPLANT
MANIFOLD NEPTUNE II (INSTRUMENTS) ×3 IMPLANT
NEEDLE ASPIRATION 22 (NEEDLE) ×3 IMPLANT
NEEDLE HYPO 18GX1.5 BLUNT FILL (NEEDLE) ×3 IMPLANT
PACK CYSTO (CUSTOM PROCEDURE TRAY) ×3 IMPLANT
PAD ARMBOARD 7.5X6 YLW CONV (MISCELLANEOUS) ×3 IMPLANT
SYR 30ML LL (SYRINGE) ×3 IMPLANT
SYR CONTROL 10ML LL (SYRINGE) ×3 IMPLANT
TOWEL OR 17X26 4PK STRL BLUE (TOWEL DISPOSABLE) ×3 IMPLANT
WATER STERILE IRR 3000ML UROMA (IV SOLUTION) ×3 IMPLANT
WATER STERILE IRR 500ML POUR (IV SOLUTION) ×3 IMPLANT

## 2021-04-18 NOTE — Op Note (Signed)
Preoperative diagnosis: Overactive bladder, gross hematuria  Postoperative diagnosis: same  Procedure: 1 cystoscopy 2. Intravesical botox injection 100 units 3. Fulgeration of bladder neck  Attending: [name]  Anesthesia: General  Estimated blood loss: Minimal  Drains: none  Specimens: none  Antibiotics: ancef  Findings:  Ureteral orifices in normal anatomic location.  No masses/lesions in the bladder. Active bleeding from bladder neck with hemostasis obtained with electrocautery   Indications: Patient is a 70 year old male with a history of overactive bladder and urinary leakage refractory to anticholinergic therapy as well as gross hematuria.  After discussing treatment options, they decided proceed with intravesical botox injection.  Procedure her in detail: The patient was brought to the operating room and a brief timeout was done to ensure correct patient, correct procedure, correct site.  General anesthesia was administered patient was placed in dorsal lithotomy position.  Their genitalia was then prepped and draped in usual sterile fashion.  A rigid 102 French cystoscope was passed in the urethra and the bladder.  Bladder was inspected and we noted no masses or lesions.  the ureteral orifices were in the normal orthotopic locations. Using a deflux injection needle we proceed to inject 100 units in a grid pattern between the ureteral orifices starting at the trigone to the mid posterior wall. We injected a total of 20 sites with 5 units per injection. We noted active bleeding from the bladder neck and hemostasis was obtained with the bugbee.The bladder was then drained and this concluded the procedure which was well tolerated by patient.  Complications: None  Condition: Stable, extubated, transferred to PACU  Plan: Patient is to be discharge home. They will followup in 2 weeks

## 2021-04-18 NOTE — Anesthesia Postprocedure Evaluation (Signed)
Anesthesia Post Note  Patient: ZORION NIMS  Procedure(s) Performed: CYSTOSCOPY WITH INJECTION- Botox 100 units (N/A Bladder) CYSTOSCOPY WITH FULGERATION of BLADDER NECK (Bladder)  Patient location during evaluation: Phase II Anesthesia Type: General Level of consciousness: awake and alert and oriented Pain management: pain level controlled Vital Signs Assessment: post-procedure vital signs reviewed and stable Respiratory status: spontaneous breathing and respiratory function stable Cardiovascular status: blood pressure returned to baseline and stable Postop Assessment: no apparent nausea or vomiting Anesthetic complications: no   No complications documented.   Last Vitals:  Vitals:   04/18/21 1345 04/18/21 1348  BP:    Pulse: 84 87  Resp: 18 20  Temp:    SpO2: 99% 100%    Last Pain:  Vitals:   04/18/21 1357  TempSrc:   PainSc: 4                  Karlen Barbar C Rexann Lueras

## 2021-04-18 NOTE — Anesthesia Preprocedure Evaluation (Signed)
Anesthesia Evaluation  Patient identified by MRN, date of birth, ID band Patient awake    Reviewed: Allergy & Precautions, NPO status , Patient's Chart, lab work & pertinent test results  History of Anesthesia Complications Negative for: history of anesthetic complications  Airway Mallampati: II  TM Distance: >3 FB Neck ROM: Full    Dental  (+) Dental Advisory Given, Missing   Pulmonary former smoker,    Pulmonary exam normal breath sounds clear to auscultation       Cardiovascular Exercise Tolerance: Good hypertension, Pt. on medications + angina (patient denied h/o chest pain) Normal cardiovascular exam Rhythm:Regular Rate:Normal  24-Aug-2020 09:58:32 [facility]-300 ROUTINE RECORD Sinus rhythm with occasional Premature ventricular complexes Nonspecific T wave abnormality Prolonged QT Abnormal ECG Confirmed by Asencion Noble [phone]) on 08/24/2020 9:44:30 PM   Neuro/Psych negative neurological ROS  negative psych ROS   GI/Hepatic negative GI ROS, Neg liver ROS,   Endo/Other  negative endocrine ROS  Renal/GU Renal InsufficiencyRenal disease     Musculoskeletal  (+) Arthritis  (back sx),   Abdominal   Peds  Hematology negative hematology ROS (+)   Anesthesia Other Findings Prostate cancer Bladder tumor  Reproductive/Obstetrics negative OB ROS                             Anesthesia Physical Anesthesia Plan  ASA: II  Anesthesia Plan: General   Post-op Pain Management:    Induction: Intravenous  PONV Risk Score and Plan: Propofol infusion  Airway Management Planned: Nasal Cannula, Natural Airway and Simple Face Mask  Additional Equipment:   Intra-op Plan:   Post-operative Plan:   Informed Consent: I have reviewed the patients History and Physical, chart, labs and discussed the procedure including the risks, benefits and alternatives for the proposed anesthesia  with the patient or authorized representative who has indicated his/her understanding and acceptance.     Dental advisory given  Plan Discussed with: CRNA and Surgeon  Anesthesia Plan Comments:         Anesthesia Quick Evaluation

## 2021-04-18 NOTE — Discharge Instructions (Signed)
Botulinum Toxin Bladder Injection  A botulinum toxin bladder injection is a procedure to treat an overactive bladder. During the procedure, a drug called botulinum toxin is injected into the bladder through a long, thin needle. This drug relaxes the bladder muscles and reduces overactivity. You may need this procedure if your medicines are not working or you cannot take them. The procedure may be repeated as needed. The treatment usually lasts for 6 months. Your health care provider will monitor you to see how well you respond. Tell a health care provider about:  Any allergies you have.  All medicines you are taking, including vitamins, herbs, eye drops, creams, and over-the-counter medicines.  Any problems you or family members have had with anesthetic medicines.  Any blood disorders you have.  Any surgeries you have had.  Any medical conditions you have.  Any previous reactions to a botulinum toxin injection.  Any symptoms of urinary tract infection. These include chills, fever, a burning feeling when passing urine, and needing to pass urine often.  Whether you are pregnant or may be pregnant. What are the risks? Generally this is a safe procedure. However, problems may occur, including:  Not being able to pass urine. If this happens, you may need to have your bladder emptied with a thin tube inserted into your urethra (urinary catheter).  Bleeding.  Urinary tract infection.  Allergic reaction to the botulinum toxin.  Pain or burning when passing urine.  Damage to other structures or organs. What happens before the procedure? Staying hydrated Follow instructions from your health care provider about hydration, which may include:  Up to 2 hours before the procedure - you may continue to drink clear liquids, such as water, clear fruit juice, black coffee, and plain tea. Eating and drinking restrictions Follow instructions from your health care provider about eating and  drinking, which may include:  8 hours before the procedure - stop eating heavy meals or foods, such as meat, fried foods, or fatty foods.  6 hours before the procedure - stop eating light meals or foods, such as toast or cereal.  6 hours before the procedure - stop drinking milk or drinks that contain milk.  2 hours before the procedure - stop drinking clear liquids. Medicines Ask your health care provider about:  Changing or stopping your regular medicines. This is especially important if you are taking diabetes medicines or blood thinners.  Taking medicines such as aspirin and ibuprofen. These medicines can thin your blood. Do not take these medicines unless your health care provider tells you to take them.  Taking over-the-counter medicines, vitamins, herbs, and supplements. General instructions  Plan to have someone take you home from the hospital or clinic.  If you will be going home right after the procedure, plan to have someone with you for 24 hours.  Ask your health care provider what steps will be taken to help prevent infection. These may include: ? Removing hair at the procedure site. ? Washing skin with a germ-killing soap. ? Antibiotic medicine. What happens during the procedure?  You will be asked to empty your bladder.  An IV will be inserted into one of your veins.  You will be given one or more of the following: ? A medicine to help you relax (sedative). ? A medicine to numb the area (local anesthetic). ? A medicine to make you fall asleep (general anesthetic).  A long, thin scope called a cystoscope will be passed into your bladder through the part of   the body that carries urine from your bladder (urethra).  The cystoscope will be used to fill your bladder with water.  A long needle will be passed through the cystoscope and into the bladder.  The botulinum toxin will be injected into your bladder. It may be injected into multiple areas of your  bladder.  Your bladder will be emptied, and the cystoscope will be removed. The procedure may vary among health care providers and hospitals.   What can I expect after procedure? After your procedure, it is common to have:  Blood-tinged urine.  Burning or soreness when you pass urine. Follow these instructions at home: Medicines  Take over-the-counter and prescription medicines only as told by your health care provider.  If you were prescribed an antibiotic medicine, take it as told by your health care provider. Do not stop taking the antibiotic even if you start to feel better. General instructions  Do not drive for 24 hours if you were given a sedative during your procedure.  Drink enough fluid to keep your urine pale yellow.  Return to your normal activities as told by your health care provider. Ask your health care provider what activities are safe for you.  Keep all follow-up visits as told by your health care provider. This is important.   Contact a health care provider if you have:  A fever or chills.  Blood-tinged urine for more than one day after your procedure.  Worsening pain or burning when you pass urine.  Pain or burning when passing urine for more than two days after your procedure.  Trouble emptying your bladder. Get help right away if you:  Have bright red blood in your urine.  Are unable to pass urine. Summary  A botulinum toxin bladder injection is a procedure to treat an overactive bladder.  This is generally a very safe procedure. However, problems may occur, including not being able to pass urine, bleeding, infection, pain, and allergic reactions to medicines.  You will be told when to stop eating and drinking, and what medicines to change or stop. Follow instructions carefully.  After the procedure, it is common to have blood in urine and to have soreness or burning when passing urine.  Contact a health care provider if you have a fever, have  blood in urine for more than a few days, or have trouble passing urine. Get help right away if you have bright red blood in the urine, or if you are unable to pass urine. This information is not intended to replace advice given to you by your health care provider. Make sure you discuss any questions you have with your health care provider. Document Revised: 05/10/2018 Document Reviewed: 05/10/2018 Elsevier Patient Education  2021 [employer].

## 2021-04-18 NOTE — Anesthesia Procedure Notes (Signed)
Date/Time: 04/18/2021 12:41 PM Performed by: Orlie Dakin, CRNA Pre-anesthesia Checklist: Patient identified, Emergency Drugs available, Suction available and Patient being monitored Patient Re-evaluated:Patient Re-evaluated prior to induction Oxygen Delivery Method: Non-rebreather mask Induction Type: IV induction Placement Confirmation: positive ETCO2

## 2021-04-18 NOTE — H&P (Signed)
Urology Admission H&P  Chief Complaint: urinary frequency  History of Present Illness: Tony Powell is a 70yo with a history of overactive bladder after radiation therapy that has been refractory to medical therapy  Past Medical History:  Diagnosis Date  . Abnormal stress test   . Borderline hypertension   . Chest discomfort    Atypical  . DJD (degenerative joint disease)    Knees, lumbar sacral spine  . Hyperlipidemia   . Hypertension   . Prostate cancer [facility])    Past Surgical History:  Procedure Laterality Date  . BACK SURGERY    . TRANSURETHRAL RESECTION OF BLADDER TUMOR N/A 08/25/2020   Procedure: TRANSURETHRAL RESECTION OF BLADDER TUMOR (TURBT);  Surgeon: Cleon Gustin, MD;  Location: [facility];  Service: Urology;  Laterality: N/A;    Home Medications:  Current Facility-Administered Medications  Medication Dose Route Frequency Provider Last Rate Last Admin  . botulinum toxin Type A (BOTOX) injection 100 Units  100 Units Intramuscular Once Cleon Gustin, MD      . ceFAZolin (ANCEF) 2-4 GM/100ML-% IVPB           . ceFAZolin (ANCEF) IVPB 2g/100 mL premix  2 g Intravenous 30 min Pre-Op Simcha Speir, Candee Furbish, MD      . lactated ringers infusion   Intravenous Continuous Denese Killings, MD 50 mL/hr at 04/18/21 1146 New Bag at 04/18/21 1146   Allergies:  Allergies  Allergen Reactions  . Celebrex [Celecoxib]     Eyes drooping and messed up skin on hands    Family History  Problem Relation Age of Onset  . Heart attack Mother   . Breast cancer Mother        Mastectomy  . Leukemia Father   . Cancer Brother   . Cancer Brother   . Cancer Brother   . Cancer Brother   . Cancer Brother   . Colon cancer Neg Hx   . Prostate cancer Neg Hx   . Pancreatic cancer Neg Hx    Social History:  reports that he quit smoking about 22 years ago. He has a 20.00 pack-year smoking history. He has never used smokeless tobacco. He reports that he does not drink alcohol and does not  use drugs.  Review of Systems  Genitourinary: Positive for frequency and urgency.  All other systems reviewed and are negative.   Physical Exam:  Vital signs in last 24 hours: Temp:  [97.7 F (36.5 C)] 97.7 F (36.5 C) (06/06 1131) Pulse Rate:  [90] 90 (06/06 1131) Resp:  [18] 18 (06/06 1131) SpO2:  [98 %] 98 % (06/06 1131) Physical Exam Constitutional:      Appearance: Normal appearance.  HENT:     Head: Normocephalic and atraumatic.     Nose: Nose normal.     Mouth/Throat:     Mouth: Mucous membranes are dry.  Eyes:     Extraocular Movements: Extraocular movements intact.     Pupils: Pupils are equal, round, and reactive to light.  Cardiovascular:     Rate and Rhythm: Normal rate and regular rhythm.  Pulmonary:     Effort: Pulmonary effort is normal. No respiratory distress.  Abdominal:     General: Abdomen is flat. There is no distension.  Musculoskeletal:        General: No swelling. Normal range of motion.     Cervical back: Normal range of motion and neck supple.  Skin:    General: Skin is warm and dry.  Neurological:  General: No focal deficit present.     Mental Status: He is alert and oriented to person, place, and time.  Psychiatric:        Mood and Affect: Mood normal.        Behavior: Behavior normal.        Thought Content: Thought content normal.        Judgment: Judgment normal.     Laboratory Data:  No results found for this or any previous visit (from the past 24 hour(s)). No results found for this or any previous visit (from the past 240 hour(s)). Creatinine: Recent Labs    04/14/21 0954  CREATININE 1.20   Baseline Creatinine: 1.2  Impression/Assessment:  70yo with OAb  Plan:  The risks/benefits/alternatives to intravesical botox injection was explained to the patient and he understands and wishes to proceed with surgery  [name] 04/18/2021, 12:02 PM

## 2021-04-18 NOTE — Transfer of Care (Signed)
Immediate Anesthesia Transfer of Care Note  Patient: Tony Powell  Procedure(s) Performed: CYSTOSCOPY WITH INJECTION- Botox 100 units (N/A Bladder) CYSTOSCOPY WITH FULGERATION of BLADDER NECK (Bladder)  Patient Location: PACU  Anesthesia Type:MAC  Level of Consciousness: awake, alert  and oriented  Airway & Oxygen Therapy: Patient Spontanous Breathing  Post-op Assessment: Report given to RN and Post -op Vital signs reviewed and stable  Post vital signs: Reviewed and stable  Last Vitals:  Vitals Value Taken Time  BP    Temp    Pulse 85 04/18/21 1314  Resp 16 04/18/21 1314  SpO2 97 % 04/18/21 1314  Vitals shown include unvalidated device data.  Last Pain:  Vitals:   04/18/21 1131  TempSrc: Oral  PainSc: 0-No pain      Patients Stated Pain Goal: 5 (73/71/06 2694)  Complications: No complications documented.

## 2021-04-19 ENCOUNTER — Encounter (HOSPITAL_COMMUNITY): Payer: Self-pay | Admitting: Urology

## 2021-04-27 ENCOUNTER — Other Ambulatory Visit: Payer: Self-pay

## 2021-04-27 DIAGNOSIS — C61 Malignant neoplasm of prostate: Secondary | ICD-10-CM

## 2021-04-27 MED ORDER — MEGESTROL ACETATE 20 MG PO TABS: 20.0000 mg | ORAL_TABLET | Freq: Two times a day (BID) | ORAL | 3 refills | Status: DC

## 2021-05-04 ENCOUNTER — Other Ambulatory Visit: Payer: Self-pay

## 2021-05-04 ENCOUNTER — Ambulatory Visit (INDEPENDENT_AMBULATORY_CARE_PROVIDER_SITE_OTHER): Payer: Medicare Other | Admitting: Urology

## 2021-05-04 ENCOUNTER — Encounter: Payer: Self-pay | Admitting: Urology

## 2021-05-04 VITALS — BP 173/89 | HR 87 | Ht 70.0 in | Wt 240.0 lb

## 2021-05-04 DIAGNOSIS — R300 Dysuria: Secondary | ICD-10-CM | POA: Diagnosis not present

## 2021-05-04 DIAGNOSIS — N401 Enlarged prostate with lower urinary tract symptoms: Secondary | ICD-10-CM | POA: Diagnosis not present

## 2021-05-04 DIAGNOSIS — N3281 Overactive bladder: Secondary | ICD-10-CM | POA: Diagnosis not present

## 2021-05-04 DIAGNOSIS — C61 Malignant neoplasm of prostate: Secondary | ICD-10-CM

## 2021-05-04 DIAGNOSIS — N138 Other obstructive and reflux uropathy: Secondary | ICD-10-CM | POA: Diagnosis not present

## 2021-05-04 MED ORDER — FINASTERIDE 5 MG PO TABS: 5.0000 mg | ORAL_TABLET | Freq: Every day | ORAL | 3 refills | Status: DC

## 2021-05-04 MED ORDER — ALFUZOSIN HCL ER 10 MG PO TB24: 10.0000 mg | ORAL_TABLET | Freq: Every day | ORAL | 11 refills | Status: DC

## 2021-05-04 MED ORDER — MIRABEGRON ER 25 MG PO TB24: 25.0000 mg | ORAL_TABLET | Freq: Every day | ORAL | 11 refills | Status: DC

## 2021-05-04 NOTE — Patient Instructions (Signed)

## 2021-05-04 NOTE — Progress Notes (Signed)

## 2021-05-04 NOTE — Progress Notes (Signed)
05/04/2021 3:04 PM   Tony Powell 1951/02/15 [id number]  Referring provider: Erven Colla, DO [place],  [place] [address]  Followup urinary frequency   HPI:  Tony Powell is a 70yo here for followup for OAB and gross hematuria. He continues to have intermittent gross hematuria often tied to straining to urinate or have a bowel movement. He has improved urinary frequency and urgency after intravesical botox. He has a feeling of incomplete emptying and nocturia 3-4x. He has frequency small volume voids at night. He has issues with constipation and when he strains to have a bowel movement he notices hematuria.  He continues to have dysuria which is improved with AZO.  PMH: Past Medical History:  Diagnosis Date   Abnormal stress test    Borderline hypertension    Chest discomfort    Atypical   DJD (degenerative joint disease)    Knees, lumbar sacral spine   Hyperlipidemia    Hypertension    Prostate cancer [facility])     Surgical History: Past Surgical History:  Procedure Laterality Date   BACK SURGERY     CYSTOSCOPY WITH FULGERATION  04/18/2021   Procedure: CYSTOSCOPY WITH FULGERATION of BLADDER NECK;  Surgeon: Cleon Gustin, MD;  Location: [facility];  Service: Urology;;   CYSTOSCOPY WITH INJECTION N/A 04/18/2021   Procedure: CYSTOSCOPY WITH INJECTION- Botox 100 units;  Surgeon: Cleon Gustin, MD;  Location: [facility];  Service: Urology;  Laterality: N/A;   TRANSURETHRAL RESECTION OF BLADDER TUMOR N/A 08/25/2020   Procedure: TRANSURETHRAL RESECTION OF BLADDER TUMOR (TURBT);  Surgeon: Cleon Gustin, MD;  Location: [facility];  Service: Urology;  Laterality: N/A;    Home Medications:  Allergies as of 05/04/2021       Reactions   Celebrex [celecoxib]    Eyes drooping and messed up skin on hands        Medication List        Accurate as of May 04, 2021  3:04 PM. If you have any questions, ask your nurse or doctor.          amLODipine 10 MG  tablet Commonly known as: NORVASC Take 1 tablet (10 mg total) by mouth daily.   cefpodoxime 200 MG tablet Commonly known as: VANTIN Take 1 tablet (200 mg total) by mouth 2 (two) times daily.   cyanocobalamin 1000 MCG tablet Take 1,000 mcg by mouth daily.   Ensure Take 237 mLs by mouth 2 (two) times daily between meals.   finasteride 5 MG tablet Commonly known as: PROSCAR Take 5 mg by mouth daily.   Gemtesa 75 MG Tabs Generic drug: Vibegron Take 1 capsule by mouth daily. What changed: how much to take   megestrol 20 MG tablet Commonly known as: MEGACE Take 1 tablet (20 mg total) by mouth 2 (two) times daily. As needed.   naproxen sodium 220 MG tablet Commonly known as: ALEVE Take 220 mg by mouth daily as needed (pain).   oxyCODONE 5 MG immediate release tablet Commonly known as: Oxy IR/ROXICODONE Take 1 tablet (5 mg total) by mouth every 6 (six) hours as needed for severe pain.   phenazopyridine 95 MG tablet Commonly known as: PYRIDIUM Take 190 mg by mouth in the morning and at bedtime.   tamsulosin 0.4 MG Caps capsule Commonly known as: FLOMAX Take 1 capsule (0.4 mg total) by mouth daily.   Uribel 118 MG Caps Take 1 capsule (118 mg total) by mouth 2 (two) times daily as  needed.        Allergies:  Allergies  Allergen Reactions   Celebrex [Celecoxib]     Eyes drooping and messed up skin on hands    Family History: Family History  Problem Relation Age of Onset   Heart attack Mother    Breast cancer Mother        Mastectomy   Leukemia Father    Cancer Brother    Cancer Brother    Cancer Brother    Cancer Brother    Cancer Brother    Colon cancer Neg Hx    Prostate cancer Neg Hx    Pancreatic cancer Neg Hx     Social History:  reports that he quit smoking about 22 years ago. He has a 20.00 pack-year smoking history. He has never used smokeless tobacco. He reports that he does not drink alcohol and does not use drugs.  ROS: All other review of  systems were reviewed and are negative except what is noted above in HPI  Physical Exam: BP (!) 173/89   Pulse 87   Ht 5\' 10"  (1.778 m)   Wt 240 lb (108.9 kg)   BMI 34.44 kg/m   Constitutional:  Alert and oriented, No acute distress. HEENT: [place] AT, moist mucus membranes.  Trachea midline, no masses. Cardiovascular: No clubbing, cyanosis, or edema. Respiratory: Normal respiratory effort, no increased work of breathing. GI: Abdomen is soft, nontender, nondistended, no abdominal masses GU: No CVA tenderness.  Lymph: No cervical or inguinal lymphadenopathy. Skin: No rashes, bruises or suspicious lesions. Neurologic: Grossly intact, no focal deficits, moving all 4 extremities. Psychiatric: Normal mood and affect.  Laboratory Data: Lab Results  Component Value Date   WBC 5.5 04/14/2021   HGB 10.4 (L) 04/14/2021   HCT 33.4 (L) 04/14/2021   MCV 86.8 04/14/2021   PLT 282 04/14/2021    Lab Results  Component Value Date   CREATININE 1.20 04/14/2021    No results found for: PSA  No results found for: TESTOSTERONE  No results found for: HGBA1C  Urinalysis    Component Value Date/Time   COLORURINE BLOODY (A) 02/26/2021 1442   APPEARANCEUR Turbid (A) 03/16/2021 0950   LABSPEC 1.010 02/26/2021 1442   PHURINE 7.0 02/26/2021 1442   GLUCOSEU 1+ (A) 03/16/2021 0950   HGBUR LARGE (A) 02/26/2021 1442   BILIRUBINUR Negative 03/16/2021 0950   Yell 02/26/2021 1442   PROTEINUR 2+ (A) 03/16/2021 0950   PROTEINUR 100 (A) 02/26/2021 1442   NITRITE Positive (A) 03/16/2021 0950   NITRITE POSITIVE (A) 02/26/2021 1442   LEUKOCYTESUR 3+ (A) 03/16/2021 0950   LEUKOCYTESUR SMALL (A) 02/26/2021 1442    Lab Results  Component Value Date   LABMICR See below: 03/16/2021   WBCUA >30 (A) 03/16/2021   LABEPIT None seen 03/16/2021   MUCUS Present 03/16/2021   BACTERIA Moderate (A) 03/16/2021    Pertinent Imaging:  No results found for this or any previous visit.  No results  found for this or any previous visit.  No results found for this or any previous visit.  No results found for this or any previous visit.  No results found for this or any previous visit.  No results found for this or any previous visit.  No results found for this or any previous visit.  No results found for this or any previous visit.   Assessment & Plan:    1. Overactive bladder -we will restart mirabegron 25mg  daily - BLADDER SCAN AMB NON-IMAGING  2. Dysuria -continue AZO prn - Urinalysis, Routine w reflex microscopic   No follow-ups on file.  [name], MD  [facility] [place]

## 2021-05-04 NOTE — Progress Notes (Signed)

## 2021-05-09 ENCOUNTER — Other Ambulatory Visit: Payer: Self-pay | Admitting: Urology

## 2021-05-22 ENCOUNTER — Other Ambulatory Visit: Payer: Self-pay

## 2021-05-22 ENCOUNTER — Emergency Department (HOSPITAL_COMMUNITY)
Admission: EM | Admit: 2021-05-22 | Discharge: 2021-05-22 | Disposition: A | Discharge status: Home / Self Care | Payer: Medicare Other | Attending: Emergency Medicine | Admitting: Emergency Medicine

## 2021-05-22 ENCOUNTER — Encounter (HOSPITAL_COMMUNITY): Payer: Self-pay | Admitting: *Deleted

## 2021-05-22 DIAGNOSIS — Z8551 Personal history of malignant neoplasm of bladder: Secondary | ICD-10-CM | POA: Insufficient documentation

## 2021-05-22 DIAGNOSIS — Z8546 Personal history of malignant neoplasm of prostate: Secondary | ICD-10-CM | POA: Diagnosis not present

## 2021-05-22 DIAGNOSIS — B9689 Other specified bacterial agents as the cause of diseases classified elsewhere: Secondary | ICD-10-CM | POA: Insufficient documentation

## 2021-05-22 DIAGNOSIS — Z9221 Personal history of antineoplastic chemotherapy: Secondary | ICD-10-CM | POA: Diagnosis not present

## 2021-05-22 DIAGNOSIS — N3001 Acute cystitis with hematuria: Secondary | ICD-10-CM | POA: Insufficient documentation

## 2021-05-22 DIAGNOSIS — I10 Essential (primary) hypertension: Secondary | ICD-10-CM | POA: Diagnosis not present

## 2021-05-22 DIAGNOSIS — Z87891 Personal history of nicotine dependence: Secondary | ICD-10-CM | POA: Insufficient documentation

## 2021-05-22 DIAGNOSIS — Z923 Personal history of irradiation: Secondary | ICD-10-CM | POA: Diagnosis not present

## 2021-05-22 DIAGNOSIS — R300 Dysuria: Secondary | ICD-10-CM | POA: Diagnosis present

## 2021-05-22 LAB — URINALYSIS, ROUTINE W REFLEX MICROSCOPIC
Bacteria, UA: NONE SEEN
Bilirubin Urine: NEGATIVE
Glucose, UA: NEGATIVE mg/dL
Ketones, ur: NEGATIVE mg/dL
Nitrite: POSITIVE — AB
Protein, ur: 100 mg/dL — AB
RBC / HPF: >50 RBC/hpf — ABNORMAL HIGH (ref 0–5)
Specific Gravity, Urine: 1.004 — ABNORMAL LOW (ref 1.005–1.030)
WBC, UA: >50 WBC/hpf — ABNORMAL HIGH (ref 0–5)
pH: 8 (ref 5.0–8.0)

## 2021-05-22 LAB — CBC WITH DIFFERENTIAL/PLATELET
Abs Immature Granulocytes: 0.01 10*3/uL (ref 0.00–0.07)
Basophils Absolute: 0 10*3/uL (ref 0.0–0.1)
Basophils Relative: 1 %
Eosinophils Absolute: 0 10*3/uL (ref 0.0–0.5)
Eosinophils Relative: 1 %
HCT: 30.2 % — ABNORMAL LOW (ref 39.0–52.0)
Hemoglobin: 9.4 g/dL — ABNORMAL LOW (ref 13.0–17.0)
Immature Granulocytes: 0 %
Lymphocytes Relative: 13 %
Lymphs Abs: 0.6 10*3/uL — ABNORMAL LOW (ref 0.7–4.0)
MCH: 26.3 pg (ref 26.0–34.0)
MCHC: 31.1 g/dL (ref 30.0–36.0)
MCV: 84.6 fL (ref 80.0–100.0)
Monocytes Absolute: 0.5 10*3/uL (ref 0.1–1.0)
Monocytes Relative: 10 %
Neutro Abs: 3.8 10*3/uL (ref 1.7–7.7)
Neutrophils Relative %: 75 %
Platelets: 286 10*3/uL (ref 150–400)
RBC: 3.57 MIL/uL — ABNORMAL LOW (ref 4.22–5.81)
RDW: 14.5 % (ref 11.5–15.5)
WBC: 5 10*3/uL (ref 4.0–10.5)
nRBC: 0 % (ref 0.0–0.2)

## 2021-05-22 LAB — BASIC METABOLIC PANEL
Anion gap: 8 (ref 5–15)
BUN: 7 mg/dL — ABNORMAL LOW (ref 8–23)
CO2: 23 mmol/L (ref 22–32)
Calcium: 8.6 mg/dL — ABNORMAL LOW (ref 8.9–10.3)
Chloride: 105 mmol/L (ref 98–111)
Creatinine, Ser: 1.24 mg/dL (ref 0.61–1.24)
GFR, Estimated: >60 mL/min (ref >60)
Glucose, Bld: 113 mg/dL — ABNORMAL HIGH (ref 70–99)
Potassium: 3.1 mmol/L — ABNORMAL LOW (ref 3.5–5.1)
Sodium: 136 mmol/L (ref 135–145)

## 2021-05-22 MED ORDER — CEFDINIR 300 MG PO CAPS
300.0000 mg | ORAL_CAPSULE | Freq: Two times a day (BID) | ORAL | Status: DC
  Administered 2021-05-22: 300 mg via ORAL
  Filled 2021-05-22: qty 1

## 2021-05-22 MED ORDER — CEFDINIR 300 MG PO CAPS: 300.0000 mg | ORAL_CAPSULE | Freq: Two times a day (BID) | ORAL | 0 refills | Status: AC

## 2021-05-22 NOTE — ED Triage Notes (Signed)
Pt with incont of urine and frequency for past 3 days, passing blood clots as well.  Burns with urination per pt. C/o chills

## 2021-05-22 NOTE — ED Provider Notes (Signed)
[facility] Provider Note   CSN: [id number] Arrival date & time: 05/22/21  1348     History Chief Complaint  Patient presents with   Dysuria    Tony Powell is a 70 y.o. male.   Dysuria Presenting symptoms: dysuria   Associated symptoms: hematuria    This patient is a 70 year old male, he has a history of overactive bladder, he has had gross hematuria and continues to have intermittent gross hematuria.  According to urology notes this has been primarily linked to straining to urinate or having bowel movements.  He was given intravesical Botox and evidently had improved urinary frequency and urgency.  He does have some chronic symptoms of dysuria and hematuria, he reports that all of this seemingly started after receiving chemotherapy and radiation for treatment for prostate cancer.  The patient has struggled with the symptoms of passing large blood clots and dysuria over time, he got better when he was on cefpodoxime, he states that after going to the office on 22 June and a bladder scan he started to have increasing bleeding which has been intermittent and worsening since that time.  Today it has been particularly bad with large clots, dysuria, penis pain though he denies fevers.  There is possibly some chills.  He has some abdominal discomfort in the lower abdomen but no back pain, no coughing or shortness of breath, no nausea or vomiting.  Symptoms are persistent and gradually worsening.  Past Medical History:  Diagnosis Date   Abnormal stress test    Borderline hypertension    Chest discomfort    Atypical   DJD (degenerative joint disease)    Knees, lumbar sacral spine   Hyperlipidemia    Hypertension    Prostate cancer [facility])     Patient Active Problem List   Diagnosis Date Noted   Hematuria 02/26/2021   Dysuria 02/02/2021   OAB (overactive bladder) 02/02/2021   Recent Pseudomonas and Enterococcus faecalis catheter associated UTI- 01/22/2021    Acute hypokalemia 01/21/2021   Diarrhea 01/21/2021   Abnormal kidney function 12/31/2020   Prostate cancer ([place]) 08/30/2020   Bladder tumor 08/23/2020   Gross hematuria 08/19/2020   Difficulty voiding 08/13/2020   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/13/2020   History of prostatitis 05/02/2020   HNP (herniated nucleus pulposus), lumbar 04/16/2013   HYPERLIPIDEMIA 05/04/2010   OVERWEIGHT 05/04/2010   Elevated blood pressure reading in office with diagnosis of hypertension 04/28/2010   DEGENERATIVE JOINT DISEASE 04/28/2010   CHEST PAIN 04/28/2010   ABNORMAL STRESS ELECTROCARDIOGRAM 04/28/2010    Past Surgical History:  Procedure Laterality Date   BACK SURGERY     CYSTOSCOPY WITH FULGERATION  04/18/2021   Procedure: [place] of BLADDER NECK;  Surgeon: Cleon Gustin, MD;  Location: [facility];  Service: Urology;;   CYSTOSCOPY WITH INJECTION N/A 04/18/2021   Procedure: CYSTOSCOPY WITH INJECTION- Botox 100 units;  Surgeon: Cleon Gustin, MD;  Location: [facility];  Service: Urology;  Laterality: N/A;   TRANSURETHRAL RESECTION OF BLADDER TUMOR N/A 08/25/2020   Procedure: TRANSURETHRAL RESECTION OF BLADDER TUMOR (TURBT);  Surgeon: Cleon Gustin, MD;  Location: [facility];  Service: Urology;  Laterality: N/A;       Family History  Problem Relation Age of Onset   Heart attack Mother    Breast cancer Mother        Mastectomy   Leukemia Father    Cancer Brother    Cancer Brother  Cancer Brother    Cancer Brother    Cancer Brother    Colon cancer Neg Hx    Prostate cancer Neg Hx    Pancreatic cancer Neg Hx     Social History   Tobacco Use   Smoking status: Former    Packs/day: 1.00    Years: 20.00    Pack years: 20.00    Types: Cigarettes    Quit date: 11/13/1998    Years since quitting: 22.5   Smokeless tobacco: Never  Substance Use Topics   Alcohol use: No   Drug use: Never    Home Medications Prior to Admission medications    Medication Sig Start Date End Date Taking? Authorizing Provider  cefdinir (OMNICEF) 300 MG capsule Take 1 capsule (300 mg total) by mouth 2 (two) times daily for 7 days. 05/22/21 05/29/21 Yes Noemi Chapel, MD  alfuzosin (UROXATRAL) 10 MG 24 hr tablet Take 1 tablet (10 mg total) by mouth daily with breakfast. 05/04/21   McKenzie, Candee Furbish, MD  amLODipine (NORVASC) 10 MG tablet Take 1 tablet (10 mg total) by mouth daily. 01/24/21   Roxan Hockey, MD  cefpodoxime (VANTIN) 200 MG tablet Take 1 tablet (200 mg total) by mouth 2 (two) times daily. 03/29/21   McKenzie, Candee Furbish, MD  cyanocobalamin 1000 MCG tablet Take 1,000 mcg by mouth daily.    [provider]  Ensure (ENSURE) Take 237 mLs by mouth 2 (two) times daily between meals.    [provider]  finasteride (PROSCAR) 5 MG tablet Take 1 tablet (5 mg total) by mouth daily. 05/04/21   McKenzie, Candee Furbish, MD  megestrol (MEGACE) 20 MG tablet Take 1 tablet (20 mg total) by mouth 2 (two) times daily. As needed. 04/27/21   McKenzie, Candee Furbish, MD  Meth-Hyo-M Barnett Hatter Phos-Ph Sal (URIBEL) 118 MG CAPS Take 1 capsule (118 mg total) by mouth 2 (two) times daily as needed. 01/19/21   McKenzie, Candee Furbish, MD  mirabegron ER (MYRBETRIQ) 25 MG TB24 tablet Take 1 tablet (25 mg total) by mouth daily. 05/04/21   McKenzie, Candee Furbish, MD  naproxen sodium (ALEVE) 220 MG tablet Take 220 mg by mouth daily as needed (pain).    [provider]  oxyCODONE (OXY IR/ROXICODONE) 5 MG immediate release tablet Take 1 tablet (5 mg total) by mouth every 6 (six) hours as needed for severe pain. 04/18/21   McKenzie, Candee Furbish, MD  phenazopyridine (PYRIDIUM) 95 MG tablet Take 190 mg by mouth in the morning and at bedtime.    [provider]  tamsulosin (FLOMAX) 0.4 MG CAPS capsule TAKE 1 CAPSULE BY MOUTH EVERY DAY 05/10/21   McKenzie, Candee Furbish, MD    Allergies    Celebrex [celecoxib]  Review of Systems   Review of Systems  Genitourinary:  Positive for  dysuria and hematuria.  All other systems reviewed and are negative.  Physical Exam Updated Vital Signs BP 140/87   Pulse 82   Temp 99.4 F (37.4 C) (Oral)   Resp 16   Ht 1.778 m (5\' 10" )   Wt 108.9 kg   SpO2 96%   BMI 34.44 kg/m   Physical Exam Vitals and nursing note reviewed.  Constitutional:      General: He is not in acute distress.    Appearance: He is well-developed.  HENT:     Head: Normocephalic and atraumatic.     Mouth/Throat:     Pharynx: No oropharyngeal exudate.  Eyes:     General:  No scleral icterus.       Right eye: No discharge.        Left eye: No discharge.     Conjunctiva/sclera: Conjunctivae normal.     Pupils: Pupils are equal, round, and reactive to light.  Neck:     Thyroid: No thyromegaly.     Vascular: No JVD.  Cardiovascular:     Rate and Rhythm: Normal rate and regular rhythm.     Heart sounds: Normal heart sounds. No murmur heard.   No friction rub. No gallop.  Pulmonary:     Effort: Pulmonary effort is normal. No respiratory distress.     Breath sounds: Normal breath sounds. No wheezing or rales.  Abdominal:     General: Bowel sounds are normal. There is no distension.     Palpations: Abdomen is soft. There is no mass.     Tenderness: There is abdominal tenderness.     Comments: Mild to moderate suprapubic tenderness  Genitourinary:    Comments: Normal-appearing penis scrotum and testicles Musculoskeletal:        General: No tenderness. Normal range of motion.     Cervical back: Normal range of motion and neck supple.     Right lower leg: No edema.     Left lower leg: No edema.  Lymphadenopathy:     Cervical: No cervical adenopathy.  Skin:    General: Skin is warm and dry.     Findings: No erythema or rash.  Neurological:     Mental Status: He is alert.     Coordination: Coordination normal.  Psychiatric:        Behavior: Behavior normal.    ED Results / Procedures / Treatments   Labs (all labs ordered are listed, but  only abnormal results are displayed) Labs Reviewed  URINALYSIS, ROUTINE W REFLEX MICROSCOPIC - Abnormal; Notable for the following components:      Result Value   Color, Urine AMBER (*)    Specific Gravity, Urine 1.004 (*)    Hgb urine dipstick LARGE (*)    Protein, ur 100 (*)    Nitrite POSITIVE (*)    Leukocytes,Ua TRACE (*)    RBC / HPF >50 (*)    WBC, UA >50 (*)    All other components within normal limits  CBC WITH DIFFERENTIAL/PLATELET - Abnormal; Notable for the following components:   RBC 3.57 (*)    Hemoglobin 9.4 (*)    HCT 30.2 (*)    Lymphs Abs 0.6 (*)    All other components within normal limits  BASIC METABOLIC PANEL - Abnormal; Notable for the following components:   Potassium 3.1 (*)    Glucose, Bld 113 (*)    BUN 7 (*)    Calcium 8.6 (*)    All other components within normal limits  URINE CULTURE    EKG None  Radiology No results found.  Procedures Procedures   Medications Ordered in ED Medications  cefdinir (OMNICEF) capsule 300 mg (300 mg Oral Given 05/22/21 1554)    ED Course  I have reviewed the triage vital signs and the nursing notes.  Pertinent labs & imaging results that were available during my care of the patient were reviewed by me and considered in my medical decision making (see chart for details).    MDM Rules/Calculators/A&P                          The patient is passing grossly bloody  urine, he has mild suprapubic discomfort with normal vital signs.  Will discuss with urology regarding this recurrent significant obstructive hematuria, likely needs to have culture done and antibiotic started  Urinalysis reveals signs of infection, I discussed the case with Dr. Junious Silk with urology who agrees that as long as the patient is passing urine spontaneously which he is he can be discharged home, there was a minimal drop in hemoglobin over the last month, he is not at risk of hemodynamic instability based on labs today.  Urinalysis does  show nitrate positive, lots of white blood cells, he will be treated for urinary tract infection, antibiotics given and prescription for home, patient agreeable  Final Clinical Impression(s) / ED Diagnoses Final diagnoses:  Acute cystitis with hematuria    Rx / DC Orders ED Discharge Orders          Ordered    cefdinir (OMNICEF) 300 MG capsule  2 times daily        05/22/21 1701             Noemi Chapel, MD 05/22/21 1703

## 2021-05-22 NOTE — Discharge Instructions (Addendum)
Your testing today shows that you have a likely urinary tract infection that is contributing to the blood in your urine.  Please drink plenty of clear liquids to make sure you are flushing out your bladder and take cefdinir twice a day for the next 7 days.  I have spoken with the urologist who recommends that you follow-up with them on Monday or Tuesday this week.  Emergency department if you are unable to pee or have severe abdominal pain or fever / vomiting

## 2021-05-24 ENCOUNTER — Telehealth: Payer: Self-pay | Admitting: Urology

## 2021-05-24 NOTE — Telephone Encounter (Signed)
Called patient and he stated he went to the ER on 05/22/21 and was started on Omnicef. Patient states that his blood clots are clearing up. He will f/u is symptoms comes back.

## 2021-05-24 NOTE — Telephone Encounter (Signed)
Pt called wanting an RX sent in for blood clots. Please call the patient when you can.

## 2021-05-25 LAB — URINE CULTURE: Culture: 30000 — AB

## 2021-05-26 ENCOUNTER — Telehealth: Payer: Self-pay | Admitting: *Deleted

## 2021-05-26 NOTE — Telephone Encounter (Signed)
Post ED Visit - Positive Culture Follow-up  Culture report reviewed by antimicrobial stewardship pharmacist: [place] Team []  Elenor Quinones, Pharm.D. []  Heide Guile, Pharm.D., BCPS AQ-ID []  Parks Neptune, Pharm.D., BCPS []  Alycia Rossetti, Pharm.D., BCPS []  [place], Pharm.D., BCPS, AAHIVP []  Legrand Como, Pharm.D., BCPS, AAHIVP []  Salome Arnt, PharmD, BCPS []  Johnnette Gourd, PharmD, BCPS []  Hughes Better, PharmD, BCPS []  Leeroy Cha, PharmD []  Laqueta Linden, PharmD, BCPS []  Albertina Parr, PharmD  [place] Team []  Leodis Sias, PharmD []  Lindell Spar, PharmD []  Royetta Asal, PharmD []  Graylin Shiver, Rph []  Rema Fendt) Glennon Mac, PharmD []  Arlyn Dunning, PharmD []  Netta Cedars, PharmD []  Dia Sitter, PharmD []  Leone Haven, PharmD []  Gretta Arab, PharmD []  Theodis Shove, PharmD []  Peggyann Juba, PharmD []  Reuel Boom, PharmD   Positive urine culture Treated with Cefdinir, organism sensitive to the same and no further patient follow-up is required at this time. Joetta Manners, PharmD  Harlon Flor Talley 05/26/2021, 10:31 AM

## 2021-06-03 ENCOUNTER — Telehealth: Payer: Self-pay

## 2021-06-03 ENCOUNTER — Other Ambulatory Visit: Payer: Self-pay

## 2021-06-03 ENCOUNTER — Other Ambulatory Visit: Payer: Medicare Other

## 2021-06-03 ENCOUNTER — Encounter: Payer: Self-pay | Admitting: Urology

## 2021-06-03 DIAGNOSIS — R300 Dysuria: Secondary | ICD-10-CM

## 2021-06-03 LAB — URINALYSIS, ROUTINE W REFLEX MICROSCOPIC
Bilirubin, UA: NEGATIVE
Glucose, UA: NEGATIVE
Ketones, UA: NEGATIVE
Nitrite, UA: NEGATIVE
RBC, UA: NEGATIVE
Specific Gravity, UA: 1.02 (ref 1.005–1.030)
Urobilinogen, Ur: 0.2 mg/dL (ref 0.2–1.0)
pH, UA: 6 (ref 5.0–7.5)

## 2021-06-03 LAB — MICROSCOPIC EXAMINATION
RBC, Urine: NONE SEEN /hpf (ref 0–2)
Renal Epithel, UA: NONE SEEN /hpf

## 2021-06-03 NOTE — Telephone Encounter (Signed)
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 Patient states he has dysuria and frequency, no fever or chills. Patient would like an antibiotic sent into the pharmacy to treat for UTI. It was explained that we would not treat with out being able to review results. He should contact DOT for treatment based off of their results or he could come to our office today and drop off a urine for review and culture.  Patient will call back. Patient called back stating that he will come to the office today and drop off a urine for culture. Will call with results

## 2021-06-06 LAB — CULTURE, URINE COMPREHENSIVE

## 2021-06-17 ENCOUNTER — Other Ambulatory Visit: Payer: Self-pay | Admitting: Urology

## 2021-06-17 DIAGNOSIS — C61 Malignant neoplasm of prostate: Secondary | ICD-10-CM

## 2021-06-24 ENCOUNTER — Other Ambulatory Visit: Payer: Self-pay

## 2021-06-24 ENCOUNTER — Encounter: Payer: Self-pay | Admitting: Urology

## 2021-07-16 ENCOUNTER — Telehealth: Payer: Self-pay

## 2021-07-16 NOTE — Telephone Encounter (Signed)
Spoke with patient to schedule his AWV appt, pt states that he has several upcoming appts with his Urologist and would like to call back after those appts to schedule his AWV.

## 2021-07-19 ENCOUNTER — Encounter: Payer: Self-pay | Admitting: Urology

## 2021-07-31 ENCOUNTER — Encounter (HOSPITAL_COMMUNITY): Payer: Self-pay

## 2021-07-31 ENCOUNTER — Other Ambulatory Visit: Payer: Self-pay

## 2021-07-31 ENCOUNTER — Emergency Department (HOSPITAL_COMMUNITY)
Admission: EM | Admit: 2021-07-31 | Discharge: 2021-07-31 | Disposition: A | Discharge status: Home / Self Care | Payer: Medicare Other | Attending: Emergency Medicine | Admitting: Emergency Medicine

## 2021-07-31 DIAGNOSIS — N3289 Other specified disorders of bladder: Secondary | ICD-10-CM | POA: Insufficient documentation

## 2021-07-31 DIAGNOSIS — R5383 Other fatigue: Secondary | ICD-10-CM | POA: Diagnosis not present

## 2021-07-31 DIAGNOSIS — R319 Hematuria, unspecified: Secondary | ICD-10-CM | POA: Diagnosis not present

## 2021-07-31 DIAGNOSIS — Z87891 Personal history of nicotine dependence: Secondary | ICD-10-CM | POA: Diagnosis not present

## 2021-07-31 DIAGNOSIS — D649 Anemia, unspecified: Secondary | ICD-10-CM | POA: Diagnosis not present

## 2021-07-31 DIAGNOSIS — R300 Dysuria: Secondary | ICD-10-CM | POA: Diagnosis present

## 2021-07-31 DIAGNOSIS — Z8546 Personal history of malignant neoplasm of prostate: Secondary | ICD-10-CM | POA: Insufficient documentation

## 2021-07-31 DIAGNOSIS — I10 Essential (primary) hypertension: Secondary | ICD-10-CM | POA: Diagnosis not present

## 2021-07-31 DIAGNOSIS — Z79899 Other long term (current) drug therapy: Secondary | ICD-10-CM | POA: Diagnosis not present

## 2021-07-31 LAB — URINALYSIS, ROUTINE W REFLEX MICROSCOPIC
Bilirubin Urine: NEGATIVE
Glucose, UA: NEGATIVE mg/dL
Ketones, ur: NEGATIVE mg/dL
Nitrite: NEGATIVE
Protein, ur: 100 mg/dL — AB
RBC / HPF: >50 RBC/hpf — ABNORMAL HIGH (ref 0–5)
Specific Gravity, Urine: 1.005 (ref 1.005–1.030)
WBC, UA: >50 WBC/hpf — ABNORMAL HIGH (ref 0–5)
pH: 8 (ref 5.0–8.0)

## 2021-07-31 LAB — BASIC METABOLIC PANEL
Anion gap: 6 (ref 5–15)
BUN: 8 mg/dL (ref 8–23)
CO2: 26 mmol/L (ref 22–32)
Calcium: 8.6 mg/dL — ABNORMAL LOW (ref 8.9–10.3)
Chloride: 106 mmol/L (ref 98–111)
Creatinine, Ser: 1.14 mg/dL (ref 0.61–1.24)
GFR, Estimated: >60 mL/min (ref >60)
Glucose, Bld: 92 mg/dL (ref 70–99)
Potassium: 3.3 mmol/L — ABNORMAL LOW (ref 3.5–5.1)
Sodium: 138 mmol/L (ref 135–145)

## 2021-07-31 LAB — CBC WITH DIFFERENTIAL/PLATELET
Abs Immature Granulocytes: 0.02 10*3/uL (ref 0.00–0.07)
Basophils Absolute: 0 10*3/uL (ref 0.0–0.1)
Basophils Relative: 0 %
Eosinophils Absolute: 0.2 10*3/uL (ref 0.0–0.5)
Eosinophils Relative: 3 %
HCT: 34.7 % — ABNORMAL LOW (ref 39.0–52.0)
Hemoglobin: 10.9 g/dL — ABNORMAL LOW (ref 13.0–17.0)
Immature Granulocytes: 0 %
Lymphocytes Relative: 16 %
Lymphs Abs: 0.9 10*3/uL (ref 0.7–4.0)
MCH: 25.8 pg — ABNORMAL LOW (ref 26.0–34.0)
MCHC: 31.4 g/dL (ref 30.0–36.0)
MCV: 82.2 fL (ref 80.0–100.0)
Monocytes Absolute: 0.8 10*3/uL (ref 0.1–1.0)
Monocytes Relative: 13 %
Neutro Abs: 3.9 10*3/uL (ref 1.7–7.7)
Neutrophils Relative %: 68 %
Platelets: 316 10*3/uL (ref 150–400)
RBC: 4.22 MIL/uL (ref 4.22–5.81)
RDW: 14.4 % (ref 11.5–15.5)
WBC: 5.8 10*3/uL (ref 4.0–10.5)
nRBC: 0 % (ref 0.0–0.2)

## 2021-07-31 LAB — PROTIME-INR
INR: 1 (ref 0.8–1.2)
Prothrombin Time: 13.3 seconds (ref 11.4–15.2)

## 2021-07-31 MED ORDER — OXYCODONE HCL 5 MG PO TABS: 5.0000 mg | ORAL_TABLET | ORAL | 0 refills | Status: AC | PRN

## 2021-07-31 MED ORDER — CEPHALEXIN 500 MG PO CAPS
500.0000 mg | ORAL_CAPSULE | Freq: Once | ORAL | Status: AC
  Administered 2021-07-31: 500 mg via ORAL
  Filled 2021-07-31: qty 1

## 2021-07-31 MED ORDER — OXYCODONE HCL 5 MG PO TABS
5.0000 mg | ORAL_TABLET | Freq: Once | ORAL | Status: AC
  Administered 2021-07-31: 5 mg via ORAL
  Filled 2021-07-31: qty 1

## 2021-07-31 MED ORDER — OXYCODONE HCL 5 MG PO TABS: 5.0000 mg | ORAL_TABLET | ORAL | 0 refills | Status: DC | PRN

## 2021-07-31 MED ORDER — CEPHALEXIN 500 MG PO CAPS
500.0000 mg | ORAL_CAPSULE | Freq: Four times a day (QID) | ORAL | 0 refills | Status: DC
Start: 2021-07-31 — End: 2021-08-10

## 2021-07-31 NOTE — Discharge Instructions (Addendum)
You have been seen and discharged from the emergency department.  Take antibiotic as directed.  Take pain medicine as needed.  Do not mix this medication with alcohol or other sedating medications. Do not drive or do heavy physical activity and to know how this medication affects you.  It may cause drowsiness.  Follow-up with your urologist for reevaluation and further care. Take home medications as prescribed. If you have any worsening symptoms or further concerns for your health please return to an emergency department for further evaluation.

## 2021-07-31 NOTE — ED Triage Notes (Signed)
Pt to er, pt states that he thinks that he has a uti, states that for the past two days he has been having blood in his urine and is how urinating clots.

## 2021-07-31 NOTE — ED Provider Notes (Signed)
[facility] EMERGENCY DEPARTMENT Provider Note   CSN: [id number] Arrival date & time: 07/31/21  1444     History Chief Complaint  Patient presents with   Hematuria    Tony Powell is a 70 y.o. male.  HPI  70 year old male with past medical history of prostate cancer and chronic hematuria who is followed closely as an outpatient with urology presents the emergency department with passing clots in the urine.  Patient states this is happened before, he states when this happens it is very painful to pass urine but he is still able to urinate.  He has had fatigue but denies any fever/chills, nausea/vomiting.  He is not on any anticoagulation.  Had finished a course of oxybutynin that it helped his bladder spasm symptoms of which he ran out of for refill.  Past Medical History:  Diagnosis Date   Abnormal stress test    Borderline hypertension    Chest discomfort    Atypical   DJD (degenerative joint disease)    Knees, lumbar sacral spine   Hyperlipidemia    Hypertension    Prostate cancer [facility])     Patient Active Problem List   Diagnosis Date Noted   Hematuria 02/26/2021   Dysuria 02/02/2021   OAB (overactive bladder) 02/02/2021   Recent Pseudomonas and Enterococcus faecalis catheter associated UTI- 01/22/2021   Acute hypokalemia 01/21/2021   Diarrhea 01/21/2021   Abnormal kidney function 12/31/2020   Prostate cancer ([place]) 08/30/2020   Bladder tumor 08/23/2020   Gross hematuria 08/19/2020   Difficulty voiding 08/13/2020   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/13/2020   History of prostatitis 05/02/2020   HNP (herniated nucleus pulposus), lumbar 04/16/2013   HYPERLIPIDEMIA 05/04/2010   OVERWEIGHT 05/04/2010   Elevated blood pressure reading in office with diagnosis of hypertension 04/28/2010   DEGENERATIVE JOINT DISEASE 04/28/2010   CHEST PAIN 04/28/2010   ABNORMAL STRESS ELECTROCARDIOGRAM 04/28/2010    Past Surgical History:  Procedure  Laterality Date   BACK SURGERY     CYSTOSCOPY WITH FULGERATION  04/18/2021   Procedure: [place] of BLADDER NECK;  Surgeon: Cleon Gustin, MD;  Location: [facility];  Service: Urology;;   CYSTOSCOPY WITH INJECTION N/A 04/18/2021   Procedure: CYSTOSCOPY WITH INJECTION- Botox 100 units;  Surgeon: Cleon Gustin, MD;  Location: [facility];  Service: Urology;  Laterality: N/A;   TRANSURETHRAL RESECTION OF BLADDER TUMOR N/A 08/25/2020   Procedure: TRANSURETHRAL RESECTION OF BLADDER TUMOR (TURBT);  Surgeon: Cleon Gustin, MD;  Location: [facility];  Service: Urology;  Laterality: N/A;       Family History  Problem Relation Age of Onset   Heart attack Mother    Breast cancer Mother        Mastectomy   Leukemia Father    Cancer Brother    Cancer Brother    Cancer Brother    Cancer Brother    Cancer Brother    Colon cancer Neg Hx    Prostate cancer Neg Hx    Pancreatic cancer Neg Hx     Social History   Tobacco Use   Smoking status: Former    Packs/day: 1.00    Years: 20.00    Pack years: 20.00    Types: Cigarettes    Quit date: 11/13/1998    Years since quitting: 22.7   Smokeless tobacco: Never  Substance Use Topics   Alcohol use: No   Drug use: Never    Home Medications Prior to Admission medications  Medication Sig Start Date End Date Taking? Authorizing Provider  alfuzosin (UROXATRAL) 10 MG 24 hr tablet Take 1 tablet (10 mg total) by mouth daily with breakfast. 05/04/21   McKenzie, Candee Furbish, MD  amLODipine (NORVASC) 10 MG tablet Take 1 tablet (10 mg total) by mouth daily. 01/24/21   Roxan Hockey, MD  cefpodoxime (VANTIN) 200 MG tablet Take 1 tablet (200 mg total) by mouth 2 (two) times daily. 03/29/21   McKenzie, Candee Furbish, MD  cyanocobalamin 1000 MCG tablet Take 1,000 mcg by mouth daily.    [provider]  Ensure (ENSURE) Take 237 mLs by mouth 2 (two) times daily between meals.    [provider]  finasteride (PROSCAR) 5 MG  tablet Take 1 tablet (5 mg total) by mouth daily. 05/04/21   McKenzie, Candee Furbish, MD  megestrol (MEGACE) 20 MG tablet TAKE 1 TABLET BY MOUTH TWICE DAILY AS NEEDED 06/23/21   McKenzie, Candee Furbish, MD  Meth-Hyo-M Barnett Hatter Phos-Ph Sal (URIBEL) 118 MG CAPS Take 1 capsule (118 mg total) by mouth 2 (two) times daily as needed. 01/19/21   McKenzie, Candee Furbish, MD  mirabegron ER (MYRBETRIQ) 25 MG TB24 tablet Take 1 tablet (25 mg total) by mouth daily. 05/04/21   McKenzie, Candee Furbish, MD  naproxen sodium (ALEVE) 220 MG tablet Take 220 mg by mouth daily as needed (pain).    [provider]  oxyCODONE (OXY IR/ROXICODONE) 5 MG immediate release tablet Take 1 tablet (5 mg total) by mouth every 6 (six) hours as needed for severe pain. 04/18/21   McKenzie, Candee Furbish, MD  phenazopyridine (PYRIDIUM) 95 MG tablet Take 190 mg by mouth in the morning and at bedtime.    [provider]  tamsulosin (FLOMAX) 0.4 MG CAPS capsule TAKE 1 CAPSULE BY MOUTH EVERY DAY 05/10/21   McKenzie, Candee Furbish, MD    Allergies    Celebrex [celecoxib]  Review of Systems   Review of Systems  Constitutional:  Negative for chills and fever.  HENT:  Negative for congestion.   Eyes:  Negative for visual disturbance.  Respiratory:  Negative for shortness of breath.   Cardiovascular:  Negative for chest pain.  Gastrointestinal:  Negative for abdominal pain, diarrhea and vomiting.  Genitourinary:  Positive for dysuria, hematuria and urgency. Negative for flank pain, penile swelling, scrotal swelling and testicular pain.  Skin:  Negative for rash.  Neurological:  Negative for headaches.   Physical Exam Updated Vital Signs BP (!) 158/74 (BP Location: Left Arm)   Pulse 86   Temp 98.7 F (37.1 C) (Oral)   Resp 18   Ht '5\' 10"'$  (1.778 m)   Wt 108.9 kg   SpO2 100%   BMI 34.44 kg/m   Physical Exam Vitals and nursing note reviewed.  Constitutional:      Appearance: Normal appearance.  HENT:     Head: Normocephalic.      Mouth/Throat:     Mouth: Mucous membranes are moist.  Cardiovascular:     Rate and Rhythm: Normal rate.  Pulmonary:     Effort: Pulmonary effort is normal. No respiratory distress.  Abdominal:     Palpations: Abdomen is soft.     Tenderness: There is no abdominal tenderness.  Skin:    General: Skin is warm.  Neurological:     Mental Status: He is alert and oriented to person, place, and time. Mental status is at baseline.  Psychiatric:        Mood and Affect: Mood normal.  ED Results / Procedures / Treatments   Labs (all labs ordered are listed, but only abnormal results are displayed) Labs Reviewed  CBC WITH DIFFERENTIAL/PLATELET - Abnormal; Notable for the following components:      Result Value   Hemoglobin 10.9 (*)    HCT 34.7 (*)    MCH 25.8 (*)    All other components within normal limits  BASIC METABOLIC PANEL - Abnormal; Notable for the following components:   Potassium 3.3 (*)    Calcium 8.6 (*)    All other components within normal limits  URINALYSIS, ROUTINE W REFLEX MICROSCOPIC - Abnormal; Notable for the following components:   Color, Urine RED (*)    APPearance HAZY (*)    Hgb urine dipstick LARGE (*)    Protein, ur 100 (*)    Leukocytes,Ua MODERATE (*)    RBC / HPF >50 (*)    WBC, UA >50 (*)    Bacteria, UA RARE (*)    All other components within normal limits  PROTIME-INR    EKG None  Radiology No results found.  Procedures Procedures   Medications Ordered in ED Medications - No data to display  ED Course  I have reviewed the triage vital signs and the nursing notes.  Pertinent labs & imaging results that were available during my care of the patient were reviewed by me and considered in my medical decision making (see chart for details).    MDM Rules/Calculators/A&P                           70 year old male with past medical history of prostate cancer presents emergency department with ongoing hematuria and dysuria with passing  clots.  Vitals are stable on arrival, he has painful urination here in the department with noticeable clots.  Not anticoagulated.  Blood work shows a baseline anemia, baseline kidney function.  Urinalysis shows a lot of blood with bacteria and leukocytes, nitrate negative, culture will be sent.  Patient used to take oxycodone for pain related to urination, this ran out about a month ago.  He had improvement of symptoms in the department with a dose of oxycodone.  Plan to provide the patient with a couple days of pain medicine and cover with antibiotics for possible urinary tract infection and he will follow-up with urology as an outpatient this week.  Patient at this time appears safe and stable for discharge and will be treated as an outpatient.  Discharge plan and strict return to ED precautions discussed, patient verbalizes understanding and agreement.  Final Clinical Impression(s) / ED Diagnoses Final diagnoses:  None    Rx / DC Orders ED Discharge Orders     None        Lorelle Gibbs, DO 07/31/21 1803

## 2021-08-01 ENCOUNTER — Telehealth: Payer: Self-pay

## 2021-08-01 ENCOUNTER — Other Ambulatory Visit: Payer: Self-pay | Admitting: Urology

## 2021-08-01 ENCOUNTER — Other Ambulatory Visit: Payer: Self-pay

## 2021-08-01 ENCOUNTER — Other Ambulatory Visit: Payer: Medicare Other

## 2021-08-01 DIAGNOSIS — C61 Malignant neoplasm of prostate: Secondary | ICD-10-CM

## 2021-08-01 MED ORDER — OXYBUTYNIN CHLORIDE ER 15 MG PO TB24: 15.0000 mg | ORAL_TABLET | Freq: Every day | ORAL | 11 refills | Status: DC

## 2021-08-01 NOTE — Telephone Encounter (Signed)
Patient came by office today to ask for a medication refill.  Patient was given oxybutynin by another provider in ED and patient reports that helped the most with his bladder symptoms. Reports myrbetriq does not give  him any relief. Message sent to MD.

## 2021-08-02 LAB — PSA: Prostate Specific Ag, Serum: 1.4 ng/mL (ref 0.0–4.0)

## 2021-08-03 LAB — URINE CULTURE: Culture: 10000 — AB

## 2021-08-04 ENCOUNTER — Telehealth: Payer: Self-pay | Admitting: *Deleted

## 2021-08-04 NOTE — Telephone Encounter (Signed)
Post ED Visit - Positive Culture Follow-up  Culture report reviewed by antimicrobial stewardship pharmacist: [place] Team []  Elenor Quinones, Pharm.D. []  Heide Guile, Pharm.D., BCPS AQ-ID []  Parks Neptune, Pharm.D., BCPS []  Alycia Rossetti, Pharm.D., BCPS []  [place], [place].D., BCPS, AAHIVP []  Legrand Como, Pharm.D., BCPS, AAHIVP []  Salome Arnt, PharmD, BCPS []  Johnnette Gourd, PharmD, BCPS []  Hughes Better, PharmD, BCPS []  Leeroy Cha, PharmD []  Laqueta Linden, PharmD, BCPS []  Albertina Parr, PharmD  [place] Team []  Leodis Sias, PharmD []  Lindell Spar, PharmD []  Royetta Asal, PharmD []  Graylin Shiver, Rph []  Rema Fendt) Glennon Mac, PharmD []  Arlyn Dunning, PharmD []  Netta Cedars, PharmD []  Dia Sitter, PharmD []  Leone Haven, PharmD []  Gretta Arab, PharmD []  Theodis Shove, PharmD []  Peggyann Juba, PharmD []  Reuel Boom, PharmD   Positive urine culture Treated with Cephalexin, organism sensitive to the same and no further patient follow-up is required at this time.  Joetta Manners, PharmD  Harlon Flor Talley 08/04/2021, 9:45 AM

## 2021-08-10 ENCOUNTER — Encounter: Payer: Self-pay | Admitting: Urology

## 2021-08-10 ENCOUNTER — Other Ambulatory Visit: Payer: Self-pay

## 2021-08-10 ENCOUNTER — Ambulatory Visit (INDEPENDENT_AMBULATORY_CARE_PROVIDER_SITE_OTHER): Payer: Medicare Other | Admitting: Urology

## 2021-08-10 DIAGNOSIS — N401 Enlarged prostate with lower urinary tract symptoms: Secondary | ICD-10-CM | POA: Diagnosis not present

## 2021-08-10 DIAGNOSIS — C61 Malignant neoplasm of prostate: Secondary | ICD-10-CM

## 2021-08-10 DIAGNOSIS — N138 Other obstructive and reflux uropathy: Secondary | ICD-10-CM

## 2021-08-10 DIAGNOSIS — N3281 Overactive bladder: Secondary | ICD-10-CM

## 2021-08-10 DIAGNOSIS — R300 Dysuria: Secondary | ICD-10-CM | POA: Diagnosis not present

## 2021-08-10 LAB — URINALYSIS, ROUTINE W REFLEX MICROSCOPIC
Bilirubin, UA: NEGATIVE
Glucose, UA: NEGATIVE
Ketones, UA: NEGATIVE
Nitrite, UA: NEGATIVE
Specific Gravity, UA: 1.02 (ref 1.005–1.030)
Urobilinogen, Ur: 0.2 mg/dL (ref 0.2–1.0)
pH, UA: 6 (ref 5.0–7.5)

## 2021-08-10 LAB — MICROSCOPIC EXAMINATION
Bacteria, UA: NONE SEEN
RBC, Urine: >30 /hpf — AB (ref 0–2)
Renal Epithel, UA: NONE SEEN /hpf

## 2021-08-10 LAB — BLADDER SCAN AMB NON-IMAGING: Scan Result: 4

## 2021-08-10 MED ORDER — FINASTERIDE 5 MG PO TABS: 5.0000 mg | ORAL_TABLET | Freq: Every day | ORAL | 3 refills | Status: DC

## 2021-08-10 MED ORDER — OXYBUTYNIN CHLORIDE ER 15 MG PO TB24: 15.0000 mg | ORAL_TABLET | Freq: Every day | ORAL | 11 refills | Status: DC

## 2021-08-10 MED ORDER — MEGESTROL ACETATE 20 MG PO TABS: 20.0000 mg | ORAL_TABLET | Freq: Three times a day (TID) | ORAL | 11 refills | Status: DC | PRN

## 2021-08-10 MED ORDER — CEPHALEXIN 500 MG PO CAPS: 500.0000 mg | ORAL_CAPSULE | Freq: Four times a day (QID) | ORAL | 5 refills | Status: DC

## 2021-08-10 NOTE — Progress Notes (Signed)
08/10/2021 3:39 PM   Tony Powell 11/11/1951 [id number]  Referring provider: Erven Colla, DO [place],  [place] [address]  Followup OAb and Prostate cancer   HPI: Mr Haberman is a 70yo here for followup for prostate cancer and OAB. He is currently on ditropan 15mg  daily which improves his urinary urgency and frequency. He underwent intravesical botox 100 units which significantly improved his urinary frequency and urgency. PSA decreased to 1.4 from 2.7. He has intermittent dysuria and passes clots in his urine after he strains to urinate. His urine stream is strong. He has mild hot flashes which are alleviated with megace. He does self start kelfex when he gets a UTI.    PMH: Past Medical History:  Diagnosis Date   Abnormal stress test    Borderline hypertension    Chest discomfort    Atypical   DJD (degenerative joint disease)    Knees, lumbar sacral spine   Hyperlipidemia    Hypertension    Prostate cancer [facility])     Surgical History: Past Surgical History:  Procedure Laterality Date   BACK SURGERY     CYSTOSCOPY WITH FULGERATION  04/18/2021   Procedure: CYSTOSCOPY WITH FULGERATION of BLADDER NECK;  Surgeon: Cleon Gustin, MD;  Location: [facility];  Service: Urology;;   CYSTOSCOPY WITH INJECTION N/A 04/18/2021   Procedure: CYSTOSCOPY WITH INJECTION- Botox 100 units;  Surgeon: Cleon Gustin, MD;  Location: [facility];  Service: Urology;  Laterality: N/A;   TRANSURETHRAL RESECTION OF BLADDER TUMOR N/A 08/25/2020   Procedure: TRANSURETHRAL RESECTION OF BLADDER TUMOR (TURBT);  Surgeon: Cleon Gustin, MD;  Location: [facility];  Service: Urology;  Laterality: N/A;    Home Medications:  Allergies as of 08/10/2021       Reactions   Celebrex [celecoxib]    Eyes drooping and messed up skin on hands        Medication List        Accurate as of August 10, 2021  3:39 PM. If you have any questions, ask your nurse or doctor.          STOP taking  these medications    alfuzosin 10 MG 24 hr tablet Commonly known as: UROXATRAL Stopped by: [name], MD   tamsulosin 0.4 MG Caps capsule Commonly known as: FLOMAX Stopped by: [name], MD       TAKE these medications    amLODipine 10 MG tablet Commonly known as: NORVASC Take 1 tablet (10 mg total) by mouth daily.   cephALEXin 500 MG capsule Commonly known as: KEFLEX Take 1 capsule (500 mg total) by mouth 4 (four) times daily.   cyanocobalamin 1000 MCG tablet Take 1,000 mcg by mouth daily.   Ensure Take 237 mLs by mouth 2 (two) times daily between meals.   finasteride 5 MG tablet Commonly known as: PROSCAR Take 1 tablet (5 mg total) by mouth daily.   megestrol 20 MG tablet Commonly known as: MEGACE TAKE 1 TABLET BY MOUTH TWICE DAILY AS NEEDED   naproxen sodium 220 MG tablet Commonly known as: ALEVE Take 220 mg by mouth daily as needed (pain).   oxybutynin 15 MG 24 hr tablet Commonly known as: DITROPAN XL Take 1 tablet (15 mg total) by mouth daily.   phenazopyridine 95 MG tablet Commonly known as: PYRIDIUM Take 190 mg by mouth in the morning and at bedtime.   Uribel 118 MG Caps Take 1 capsule (118 mg total) by mouth 2 (two) times  daily as needed.        Allergies:  Allergies  Allergen Reactions   Celebrex [Celecoxib]     Eyes drooping and messed up skin on hands    Family History: Family History  Problem Relation Age of Onset   Heart attack Mother    Breast cancer Mother        Mastectomy   Leukemia Father    Cancer Brother    Cancer Brother    Cancer Brother    Cancer Brother    Cancer Brother    Colon cancer Neg Hx    Prostate cancer Neg Hx    Pancreatic cancer Neg Hx     Social History:  reports that he quit smoking about 22 years ago. He has a 20.00 pack-year smoking history. He has never used smokeless tobacco. He reports that he does not drink alcohol and does not use drugs.  ROS: All other review of systems were  reviewed and are negative except what is noted above in HPI  Physical Exam: There were no vitals taken for this visit.  Constitutional:  Alert and oriented, No acute distress. HEENT: [place] AT, moist mucus membranes.  Trachea midline, no masses. Cardiovascular: No clubbing, cyanosis, or edema. Respiratory: Normal respiratory effort, no increased work of breathing. GI: Abdomen is soft, nontender, nondistended, no abdominal masses GU: No CVA tenderness.  Lymph: No cervical or inguinal lymphadenopathy. Skin: No rashes, bruises or suspicious lesions. Neurologic: Grossly intact, no focal deficits, moving all 4 extremities. Psychiatric: Normal mood and affect.  Laboratory Data: Lab Results  Component Value Date   WBC 5.8 07/31/2021   HGB 10.9 (L) 07/31/2021   HCT 34.7 (L) 07/31/2021   MCV 82.2 07/31/2021   PLT 316 07/31/2021    Lab Results  Component Value Date   CREATININE 1.14 07/31/2021    No results found for: PSA  No results found for: TESTOSTERONE  No results found for: HGBA1C  Urinalysis    Component Value Date/Time   COLORURINE RED (A) 07/31/2021 1607   APPEARANCEUR HAZY (A) 07/31/2021 1607   APPEARANCEUR Clear 06/03/2021 1424   LABSPEC 1.005 07/31/2021 1607   PHURINE 8.0 07/31/2021 1607   GLUCOSEU NEGATIVE 07/31/2021 1607   HGBUR LARGE (A) 07/31/2021 1607   BILIRUBINUR NEGATIVE 07/31/2021 1607   BILIRUBINUR Negative 06/03/2021 1424   KETONESUR NEGATIVE 07/31/2021 1607   PROTEINUR 100 (A) 07/31/2021 1607   NITRITE NEGATIVE 07/31/2021 1607   LEUKOCYTESUR MODERATE (A) 07/31/2021 1607    Lab Results  Component Value Date   LABMICR See below: 06/03/2021   WBCUA 6-10 (A) 06/03/2021   LABEPIT 0-10 06/03/2021   MUCUS Present 06/03/2021   BACTERIA RARE (A) 07/31/2021    Pertinent Imaging:  No results found for this or any previous visit.  No results found for this or any previous visit.  No results found for this or any previous visit.  No results found  for this or any previous visit.  No results found for this or any previous visit.  No results found for this or any previous visit.  No results found for this or any previous visit.  No results found for this or any previous visit.   Assessment & Plan:    1. Overactive bladder -Continue oxybutynin 15mg  daily - BLADDER SCAN AMB NON-IMAGING  2. Dysuria -improved. Likely related to IMRT - Urinalysis, Routine w reflex microscopic  3. Prostate cancer ([place]) -RTC 3 months with PSA   No follow-ups on file.  Saralyn Pilar  [place], [facility] [place]

## 2021-08-10 NOTE — Patient Instructions (Signed)
Prostate Cancer °The prostate is a small gland that helps make semen. It is located below a man's bladder, in front of the rectum. Prostate cancer is when abnormal cells grow in this gland. °What are the causes? °The cause of this condition is not known. °What increases the risk? °Being age 70 or older. °Having a family history of prostate cancer. °Having a family history of cancer of the breasts or ovaries. °Having genes that are passed from parent to child (inherited). °Having Lynch syndrome. °African American men and men of African descent are diagnosed with prostate cancer at higher rates than other men. °What are the signs or symptoms? °Problems peeing (urinating). This may include: °A stream that is weak, or pee that stops and starts. °Trouble starting or stopping your pee. °Trouble emptying all of your pee. °Needing to pee more often, especially at night. °Blood in your pee or semen. °Pain in the: °Lower back. °Lower belly (abdomen). °Hips. °Trouble getting an erection. °Weakness or numbness in the legs or feet. °How is this treated? °Treatment for this condition depends on: °How much the cancer has spread. °Your age. °The kind of treatment you want. °Your health. °Treatments include: °Being watched. This is called observation. You will be tested from time to time, but you will not get treated. Tests are to make sure that the cancer is not growing. °Surgery. This may be done to: °Take out (remove) the prostate. °Freeze and kill cancer cells. °Radiation. This uses a strong beam of energy to kill cancer cells. °Chemotherapy. This uses medicines that stop cancer cells from increasing. This kills cancer cells and healthy cells. °Targeted therapy. This kills cancer cells only. Healthy cells are not affected. °Hormone treatment. This stops the body from making hormones that help the cancer cells grow. °Follow these instructions at home: °Lifestyle °Do not smoke or use any products that contain nicotine or tobacco.  If you need help quitting, ask your doctor. °Eat a healthy diet. °Treatment may affect your ability to have sex. If you have a partner, touch, hold, hug, and caress your partner to have intimate moments. °Get plenty of sleep. °Ask your doctor for help to find a support group for men with prostate cancer. °General instructions °Take over-the-counter and prescription medicines only as told by your doctor. °If you have to go to the hospital, let your cancer doctor (oncologist) know. °Keep all follow-up visits. °Where to find more information °American Cancer Society: www.cancer.org °American Society of Clinical Oncology: www.cancer.net °National Cancer Institute: www.cancer.gov °Contact a doctor if: °You have new or more trouble peeing. °You have new or more blood in your pee. °You have new or more pain in your hips, back, or chest. °Get help right away if: °You have weakness in your legs. °You lose feeling in your legs. °You cannot control your pee or your poop (stool). °You have chills or a fever. °Summary °The prostate is a male gland that helps make semen. °Prostate cancer is when abnormal cells grow in this gland. °Treatment includes doing surgery, using medicines, using strong beams of energy, or watching without treatment. °Ask your doctor for help to find a support group for men with prostate cancer. °Contact a doctor if you have problems peeing or have any new pain that you did not have before. °This information is not intended to replace advice given to you by your health care provider. Make sure you discuss any questions you have with your health care provider. °Document Revised: 01/26/2021 Document Reviewed: 01/26/2021 °Elsevier   Patient Education © 2022 Elsevier Inc. ° °

## 2021-08-10 NOTE — Progress Notes (Signed)
post void residual=4  Urological Symptom Review  Patient is experiencing the following symptoms: Frequent urination Hard to postpone urination Burning/pain with urination Get up at night to urinate Trouble starting stream Have to strain to urinate Urinary tract infection Weak stream Erection problems (male only) Penile pain (male only)    Review of Systems  Gastrointestinal (upper)  : Negative for upper GI symptoms  Gastrointestinal (lower) : Diarrhea Constipation  Constitutional : Negative for symptoms  Skin: Itching  Eyes: Blurred vision  Ear/Nose/Throat : Negative for Ear/Nose/Throat symptoms  Hematologic/Lymphatic: Negative for Hematologic/Lymphatic symptoms  Cardiovascular : Negative for cardiovascular symptoms  Respiratory : Negative for respiratory symptoms  Endocrine: Negative for endocrine symptoms  Musculoskeletal: Joint pain  Neurological: Negative for neurological symptoms  Psychologic: Negative for psychiatric symptoms

## 2021-08-11 ENCOUNTER — Telehealth: Payer: Self-pay

## 2021-08-11 NOTE — Telephone Encounter (Signed)
Patient called requesting pain medication for bladder spasms. Patient informed that the oxybutynin was sent in for that. Patient stated, "No I want pain medicine. He told me he was going to send me in something for pain."  Please advise.

## 2021-08-12 LAB — URINE CULTURE: Organism ID, Bacteria: NO GROWTH

## 2021-08-15 NOTE — Progress Notes (Signed)
Results mailed to patient;.

## 2021-10-07 ENCOUNTER — Telehealth: Payer: Self-pay | Admitting: Family Medicine

## 2021-10-10 ENCOUNTER — Telehealth: Payer: Self-pay

## 2021-10-10 DIAGNOSIS — N529 Male erectile dysfunction, unspecified: Secondary | ICD-10-CM

## 2021-10-10 NOTE — Telephone Encounter (Signed)
Patient called office to ask about Dr. Alyson Ingles writing BP medication for patient. Encouraged patient to reach out to his PCP for BP Meds.  Patient also request sildenafil medication for ED. Message sent to provider for medication request.

## 2021-10-13 ENCOUNTER — Other Ambulatory Visit: Payer: Self-pay | Admitting: Family Medicine

## 2021-10-21 MED ORDER — SILDENAFIL CITRATE 100 MG PO TABS: 100.0000 mg | ORAL_TABLET | Freq: Every day | ORAL | 3 refills | Status: DC | PRN

## 2021-10-21 NOTE — Telephone Encounter (Signed)
Order received from Dr. Alyson Ingles for Sildenafil  100 mg daily as needed. Sent to pharmacy and patient called and notified.

## 2021-11-02 ENCOUNTER — Other Ambulatory Visit: Payer: Self-pay | Admitting: Urology

## 2021-11-09 ENCOUNTER — Other Ambulatory Visit: Payer: Self-pay

## 2021-11-09 ENCOUNTER — Encounter: Payer: Self-pay | Admitting: Urology

## 2021-11-09 ENCOUNTER — Ambulatory Visit (INDEPENDENT_AMBULATORY_CARE_PROVIDER_SITE_OTHER): Payer: Medicare Other | Admitting: Urology

## 2021-11-09 VITALS — BP 194/91 | HR 112

## 2021-11-09 DIAGNOSIS — C61 Malignant neoplasm of prostate: Secondary | ICD-10-CM

## 2021-11-09 DIAGNOSIS — N3281 Overactive bladder: Secondary | ICD-10-CM

## 2021-11-09 DIAGNOSIS — N138 Other obstructive and reflux uropathy: Secondary | ICD-10-CM

## 2021-11-09 DIAGNOSIS — N401 Enlarged prostate with lower urinary tract symptoms: Secondary | ICD-10-CM | POA: Diagnosis not present

## 2021-11-09 DIAGNOSIS — R300 Dysuria: Secondary | ICD-10-CM | POA: Diagnosis not present

## 2021-11-09 LAB — URINALYSIS, ROUTINE W REFLEX MICROSCOPIC
Bilirubin, UA: NEGATIVE
Glucose, UA: NEGATIVE
Ketones, UA: NEGATIVE
Leukocytes,UA: NEGATIVE
Nitrite, UA: NEGATIVE
Specific Gravity, UA: 1.015 (ref 1.005–1.030)
Urobilinogen, Ur: 0.2 mg/dL (ref 0.2–1.0)
pH, UA: 6 (ref 5.0–7.5)

## 2021-11-09 LAB — MICROSCOPIC EXAMINATION
Epithelial Cells (non renal): >10 /hpf — AB (ref 0–10)
Renal Epithel, UA: NONE SEEN /hpf
WBC, UA: NONE SEEN /hpf (ref 0–5)

## 2021-11-09 MED ORDER — LEUPROLIDE ACETATE (6 MONTH) 45 MG ~~LOC~~ KIT
45.0000 mg | PACK | Freq: Once | SUBCUTANEOUS | Status: AC
  Administered 2021-11-09: 11:00:00 45 mg via SUBCUTANEOUS

## 2021-11-09 MED ORDER — CEPHALEXIN 500 MG PO CAPS: 500.0000 mg | ORAL_CAPSULE | Freq: Every day | ORAL | 11 refills | Status: DC | PRN

## 2021-11-09 MED ORDER — MEGESTROL ACETATE 20 MG PO TABS: 20.0000 mg | ORAL_TABLET | Freq: Three times a day (TID) | ORAL | 11 refills | Status: DC | PRN

## 2021-11-09 MED ORDER — FINASTERIDE 5 MG PO TABS: 5.0000 mg | ORAL_TABLET | Freq: Every day | ORAL | 3 refills | Status: DC

## 2021-11-09 MED ORDER — OXYBUTYNIN CHLORIDE ER 15 MG PO TB24: 15.0000 mg | ORAL_TABLET | Freq: Every day | ORAL | 3 refills | Status: DC

## 2021-11-09 MED ORDER — OXYCODONE-ACETAMINOPHEN 5-325 MG PO TABS
1.0000 | ORAL_TABLET | ORAL | 0 refills | Status: DC | PRN
Start: 2021-11-09 — End: 2022-05-12

## 2021-11-09 NOTE — Progress Notes (Signed)
Urological Symptom Review  Patient is experiencing the following symptoms: Frequent urination Burning/pain with urination Get up at night to urinate Leakage of urine Stream starts and stops Trouble starting stream Have to strain to urinate Weak stream Penile pain (male only)    Review of Systems  Gastrointestinal (upper)  : Negative for upper GI symptoms  Gastrointestinal (lower) : Negative for lower GI symptoms  Constitutional : Night Sweats  Skin: Negative for skin symptoms  Eyes: Negative for eye symptoms  Ear/Nose/Throat : Negative for Ear/Nose/Throat symptoms  Hematologic/Lymphatic: Negative for Hematologic/Lymphatic symptoms  Cardiovascular : Negative for cardiovascular symptoms  Respiratory : Negative for respiratory symptoms  Endocrine: Negative for endocrine symptoms  Musculoskeletal: Back pain Joint pain  Neurological: Negative for neurological symptoms  Psychologic: Negative for psychiatric symptoms

## 2021-11-09 NOTE — Patient Instructions (Signed)
Prostate Cancer °The prostate is a small gland that helps make semen. It is located below a man's bladder, in front of the rectum. Prostate cancer is when abnormal cells grow in this gland. °What are the causes? °The cause of this condition is not known. °What increases the risk? °Being age 70 or older. °Having a family history of prostate cancer. °Having a family history of cancer of the breasts or ovaries. °Having genes that are passed from parent to child (inherited). °Having Lynch syndrome. °African American men and men of African descent are diagnosed with prostate cancer at higher rates than other men. °What are the signs or symptoms? °Problems peeing (urinating). This may include: °A stream that is weak, or pee that stops and starts. °Trouble starting or stopping your pee. °Trouble emptying all of your pee. °Needing to pee more often, especially at night. °Blood in your pee or semen. °Pain in the: °Lower back. °Lower belly (abdomen). °Hips. °Trouble getting an erection. °Weakness or numbness in the legs or feet. °How is this treated? °Treatment for this condition depends on: °How much the cancer has spread. °Your age. °The kind of treatment you want. °Your health. °Treatments include: °Being watched. This is called observation. You will be tested from time to time, but you will not get treated. Tests are to make sure that the cancer is not growing. °Surgery. This may be done to: °Take out (remove) the prostate. °Freeze and kill cancer cells. °Radiation. This uses a strong beam of energy to kill cancer cells. °Chemotherapy. This uses medicines that stop cancer cells from increasing. This kills cancer cells and healthy cells. °Targeted therapy. This kills cancer cells only. Healthy cells are not affected. °Hormone treatment. This stops the body from making hormones that help the cancer cells grow. °Follow these instructions at home: °Lifestyle °Do not smoke or use any products that contain nicotine or tobacco.  If you need help quitting, ask your doctor. °Eat a healthy diet. °Treatment may affect your ability to have sex. If you have a partner, touch, hold, hug, and caress your partner to have intimate moments. °Get plenty of sleep. °Ask your doctor for help to find a support group for men with prostate cancer. °General instructions °Take over-the-counter and prescription medicines only as told by your doctor. °If you have to go to the hospital, let your cancer doctor (oncologist) know. °Keep all follow-up visits. °Where to find more information °American Cancer Society: www.cancer.org °American Society of Clinical Oncology: www.cancer.net °National Cancer Institute: www.cancer.gov °Contact a doctor if: °You have new or more trouble peeing. °You have new or more blood in your pee. °You have new or more pain in your hips, back, or chest. °Get help right away if: °You have weakness in your legs. °You lose feeling in your legs. °You cannot control your pee or your poop (stool). °You have chills or a fever. °Summary °The prostate is a male gland that helps make semen. °Prostate cancer is when abnormal cells grow in this gland. °Treatment includes doing surgery, using medicines, using strong beams of energy, or watching without treatment. °Ask your doctor for help to find a support group for men with prostate cancer. °Contact a doctor if you have problems peeing or have any new pain that you did not have before. °This information is not intended to replace advice given to you by your health care provider. Make sure you discuss any questions you have with your health care provider. °Document Revised: 01/26/2021 Document Reviewed: 01/26/2021 °Elsevier   Patient Education © 2022 Elsevier Inc. ° °

## 2021-11-09 NOTE — Progress Notes (Signed)
Eligard SubQ Injection   Due to Prostate Cancer patient is present today for a Eligard Injection.  Medication: Eligard 6 month Dose: 45 mg  Location: left  Lot: [id number]  Exp: [id number]  Patient tolerated well, no complications were noted  Performed by: Estill Bamberg RN

## 2021-11-09 NOTE — Progress Notes (Signed)
11/09/2021 11:17 AM   Tony Powell Nov 26, 1950 [id number]  Referring provider: Erven Colla, DO [place],  [place] [address]  Followup prostate cancer and BPH with gross hematuria   HPI: Tony Powell is a 70yo here followup for BPh with gross hematuria, dysuira and prostate cancer. No recent PSA. He is due for eligard 45mg  today. He has occasional hot flashes. No gross hematuria since last visit. He is on finasteride 5mg  daily. IPSS 14 QOL 3. Nocturia 2-3x. Urine stream strong. Dysuria resolved since he started taking kefelx 500mg  daily. No other complaints today.    PMH: Past Medical History:  Diagnosis Date   Abnormal stress test    Borderline hypertension    Chest discomfort    Atypical   DJD (degenerative joint disease)    Knees, lumbar sacral spine   Hyperlipidemia    Hypertension    Prostate cancer [facility])     Surgical History: Past Surgical History:  Procedure Laterality Date   BACK SURGERY     CYSTOSCOPY WITH FULGERATION  04/18/2021   Procedure: CYSTOSCOPY WITH FULGERATION of BLADDER NECK;  Surgeon: Cleon Gustin, MD;  Location: [facility];  Service: Urology;;   CYSTOSCOPY WITH INJECTION N/A 04/18/2021   Procedure: CYSTOSCOPY WITH INJECTION- Botox 100 units;  Surgeon: Cleon Gustin, MD;  Location: [facility];  Service: Urology;  Laterality: N/A;   TRANSURETHRAL RESECTION OF BLADDER TUMOR N/A 08/25/2020   Procedure: TRANSURETHRAL RESECTION OF BLADDER TUMOR (TURBT);  Surgeon: Cleon Gustin, MD;  Location: [facility];  Service: Urology;  Laterality: N/A;    Home Medications:  Allergies as of 11/09/2021       Reactions   Celebrex [celecoxib]    Eyes drooping and messed up skin on hands        Medication List        Accurate as of November 09, 2021 11:17 AM. If you have any questions, ask your nurse or doctor.          amLODipine 10 MG tablet Commonly known as: NORVASC Take 1 tablet (10 mg total) by mouth daily. PLEASE SCHEDULE APPT FOR  FUTURE REFILLS   cephALEXin 500 MG capsule Commonly known as: KEFLEX TAKE ONE (1) CAPSULE (500 MG TOTAL) BY MOUTH FOUR (4) (FOUR) TIMES DAILY.   cyanocobalamin 1000 MCG tablet Take 1,000 mcg by mouth daily.   Ensure Take 237 mLs by mouth 2 (two) times daily between meals.   finasteride 5 MG tablet Commonly known as: PROSCAR Take 1 tablet (5 mg total) by mouth daily.   megestrol 20 MG tablet Commonly known as: MEGACE Take 1 tablet (20 mg total) by mouth 3 (three) times daily as needed.   naproxen sodium 220 MG tablet Commonly known as: ALEVE Take 220 mg by mouth daily as needed (pain).   oxybutynin 15 MG 24 hr tablet Commonly known as: DITROPAN XL Take 1 tablet (15 mg total) by mouth daily.   phenazopyridine 95 MG tablet Commonly known as: PYRIDIUM Take 190 mg by mouth in the morning and at bedtime.   sildenafil 100 MG tablet Commonly known as: VIAGRA Take 1 tablet (100 mg total) by mouth daily as needed for erectile dysfunction.   Uribel 118 MG Caps Take 1 capsule (118 mg total) by mouth 2 (two) times daily as needed.        Allergies:  Allergies  Allergen Reactions   Celebrex [Celecoxib]     Eyes drooping and messed up skin on hands  Family History: Family History  Problem Relation Age of Onset   Heart attack Mother    Breast cancer Mother        Mastectomy   Leukemia Father    Cancer Brother    Cancer Brother    Cancer Brother    Cancer Brother    Cancer Brother    Colon cancer Neg Hx    Prostate cancer Neg Hx    Pancreatic cancer Neg Hx     Social History:  reports that he quit smoking about 23 years ago. His smoking use included cigarettes. He has a 20.00 pack-year smoking history. He has never used smokeless tobacco. He reports that he does not drink alcohol and does not use drugs.  ROS: All other review of systems were reviewed and are negative except what is noted above in HPI  Physical Exam: BP (!) 194/91    Pulse (!) 112    Constitutional:  Alert and oriented, No acute distress. HEENT: [place] AT, moist mucus membranes.  Trachea midline, no masses. Cardiovascular: No clubbing, cyanosis, or edema. Respiratory: Normal respiratory effort, no increased work of breathing. GI: Abdomen is soft, nontender, nondistended, no abdominal masses GU: No CVA tenderness.  Lymph: No cervical or inguinal lymphadenopathy. Skin: No rashes, bruises or suspicious lesions. Neurologic: Grossly intact, no focal deficits, moving all 4 extremities. Psychiatric: Normal mood and affect.  Laboratory Data: Lab Results  Component Value Date   WBC 5.8 07/31/2021   HGB 10.9 (L) 07/31/2021   HCT 34.7 (L) 07/31/2021   MCV 82.2 07/31/2021   PLT 316 07/31/2021    Lab Results  Component Value Date   CREATININE 1.14 07/31/2021    No results found for: PSA  No results found for: TESTOSTERONE  No results found for: HGBA1C  Urinalysis    Component Value Date/Time   COLORURINE RED (A) 07/31/2021 1607   APPEARANCEUR Hazy (A) 08/10/2021 1525   LABSPEC 1.005 07/31/2021 1607   PHURINE 8.0 07/31/2021 1607   GLUCOSEU Negative 08/10/2021 1525   HGBUR LARGE (A) 07/31/2021 1607   BILIRUBINUR Negative 08/10/2021 1525   KETONESUR NEGATIVE 07/31/2021 1607   PROTEINUR 1+ (A) 08/10/2021 1525   PROTEINUR 100 (A) 07/31/2021 1607   NITRITE Negative 08/10/2021 1525   NITRITE NEGATIVE 07/31/2021 1607   LEUKOCYTESUR 1+ (A) 08/10/2021 1525   LEUKOCYTESUR MODERATE (A) 07/31/2021 1607    Lab Results  Component Value Date   LABMICR See below: 08/10/2021   WBCUA 6-10 (A) 08/10/2021   LABEPIT 0-10 08/10/2021   MUCUS Present 06/03/2021   BACTERIA None seen 08/10/2021    Pertinent Imaging:  No results found for this or any previous visit.  No results found for this or any previous visit.  No results found for this or any previous visit.  No results found for this or any previous visit.  No results found for this or any previous visit.  No  results found for this or any previous visit.  No results found for this or any previous visit.  No results found for this or any previous visit.   Assessment & Plan:    1. Overactive bladder -continue ditropan 15mg  daily - Urinalysis, Routine w reflex microscopic  2. Dysuria -resolved -continue kelfex 500mg  daily - Urinalysis, Routine w reflex microscopic  3. Prostate cancer ([place]) -eligard 45mg  today -RTC 6 months with PSA  4. Gross hematuria -continue finasteride 5mg  daily   No follow-ups on file.  [name], MD  [facility] [place]

## 2021-11-25 NOTE — Telephone Encounter (Signed)
Has appointment 12/16/21

## 2021-11-25 NOTE — Telephone Encounter (Signed)
Has appointment on 12/16/21

## 2021-12-16 ENCOUNTER — Ambulatory Visit (INDEPENDENT_AMBULATORY_CARE_PROVIDER_SITE_OTHER): Payer: Medicare Other | Admitting: Family Medicine

## 2021-12-16 ENCOUNTER — Other Ambulatory Visit: Payer: Self-pay

## 2021-12-16 DIAGNOSIS — I10 Essential (primary) hypertension: Secondary | ICD-10-CM

## 2021-12-16 DIAGNOSIS — C61 Malignant neoplasm of prostate: Secondary | ICD-10-CM

## 2021-12-16 DIAGNOSIS — E785 Hyperlipidemia, unspecified: Secondary | ICD-10-CM

## 2021-12-16 DIAGNOSIS — J439 Emphysema, unspecified: Secondary | ICD-10-CM | POA: Insufficient documentation

## 2021-12-16 DIAGNOSIS — D649 Anemia, unspecified: Secondary | ICD-10-CM | POA: Diagnosis not present

## 2021-12-16 MED ORDER — AMLODIPINE BESYLATE 10 MG PO TABS: 10.0000 mg | ORAL_TABLET | Freq: Every day | ORAL | 3 refills | Status: DC

## 2021-12-16 NOTE — Progress Notes (Signed)
Subjective:  Patient ID: Tony Powell, male    DOB: 12-27-1950  Age: 71 y.o. MRN: [id number]  CC: Chief Complaint  Patient presents with   Hypertension   Right hip pain     Urinary pain , back pain chronic     HPI:  71 year old male with overactive bladder, history of bladder tumor, prostate cancer status post radiation and current hormonal treatment, hypertension, emphysema per imaging, history of tobacco abuse, hyperlipidemia, and anemia presents for follow-up.  Patient's hypertension has been stable.  Systolic blood pressure is slightly elevated today.  He endorses compliance with amlodipine.  Needs refill.  Patient is following closely with Dr. Alyson Ingles regarding his prostate cancer, BPH, and overactive bladder.  Patient is not taking Keflex as prescribed.  He is taking it only when he has symptoms.  Patient has a history of anemia.  Last labs that are available to me were in September 2022.  Patient does not wish to repeat his blood work at this time.  He states that he had issues with our office previously regarding blood work.  I informed him that I would be happy to order his blood work to be done at the same time that he gets his blood work done from Dr. Alyson Ingles.  Patient reports ongoing back pain and also reports issues with dysuria and passage of clots.  Patient is requesting his pain medication to be refilled.  He was previously prescribed oxycodone.  Patient Active Problem List   Diagnosis Date Noted   Hyperlipidemia 12/16/2021   Anemia 12/16/2021   Emphysema/COPD ([place]) 12/16/2021   Essential hypertension 12/16/2021   OAB (overactive bladder) 02/02/2021   Prostate cancer ([place]) 08/30/2020   Bladder tumor 08/23/2020   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/13/2020    Social Hx   Social History   Socioeconomic History   Marital status: Divorced    Spouse name: Not on file   Number of children: 2   Years of education: Not on file   Highest  education level: Not on file  Occupational History   Occupation: Long Production manager: [employer]  Tobacco Use   Smoking status: Former    Packs/day: 1.00    Years: 20.00    Pack years: 20.00    Types: Cigarettes    Quit date: 11/13/1998    Years since quitting: 23.1   Smokeless tobacco: Never  Substance and Sexual Activity   Alcohol use: No   Drug use: Never   Sexual activity: Not Currently  Other Topics Concern   Not on file  Social History Narrative   Divorced   Social Determinants of Health   Financial Resource Strain: Not on file  Food Insecurity: Not on file  Transportation Needs: Not on file  Physical Activity: Not on file  Stress: Not on file  Social Connections: Not on file    Review of Systems Per HPI  Objective:  BP (!) 142/80    Pulse 93    Temp 98 F (36.7 C)    Ht 5' 10"  (1.778 m)    Wt 249 lb (112.9 kg)    SpO2 99%    BMI 35.73 kg/m   BP/Weight 12/16/2021 11/09/2021 6/71/2458  Systolic BP 099 833 825  Diastolic BP 80 91 77  Wt. (Lbs) 249 - 240  BMI 35.73 - 34.44    Physical Exam Vitals and nursing note reviewed.  Constitutional:      General: He is  not in acute distress.    Appearance: Normal appearance.  HENT:     Head: Normocephalic and atraumatic.  Eyes:     General:        Right eye: No discharge.        Left eye: No discharge.     Conjunctiva/sclera: Conjunctivae normal.  Pulmonary:     Effort: Pulmonary effort is normal. No respiratory distress.  Neurological:     Mental Status: He is alert.    Lab Results  Component Value Date   WBC 5.8 07/31/2021   HGB 10.9 (L) 07/31/2021   HCT 34.7 (L) 07/31/2021   PLT 316 07/31/2021   GLUCOSE 92 07/31/2021   CHOL 226 (H) 12/18/2018   TRIG 167 (H) 12/18/2018   HDL 40 12/18/2018   LDLCALC 153 (H) 12/18/2018   ALT 13 02/09/2021   AST 19 02/09/2021   NA 138 07/31/2021   K 3.3 (L) 07/31/2021   CL 106 07/31/2021   CREATININE 1.14 07/31/2021   BUN 8 07/31/2021   CO2  26 07/31/2021   TSH 2.140 04/29/2010   INR 1.0 07/31/2021     Assessment & Plan:   Problem List Items Addressed This Visit       Cardiovascular and Mediastinum   Essential hypertension    Stable. Continue Norvasc. Refilled today.      Relevant Medications   amLODipine (NORVASC) 10 MG tablet   Other Relevant Orders   CMP14+EGFR   RESOLVED: Elevated blood pressure reading in office with diagnosis of hypertension   Relevant Medications   amLODipine (NORVASC) 10 MG tablet     Genitourinary   Prostate cancer ([place])    I spoke with Dr. Alyson Ingles today.  I informed him that he has been taking Keflex as needed as opposed to daily as recommended.  Dr. Alyson Ingles advised against pain medication as his issues with dysuria have been self-induced from squeezing and Valsalva.  Dr. Alyson Ingles recommended continuing medications like Uribel and Azo to help with dysuria.        Other   Hyperlipidemia    Uncontrolled.  Patient has not had any recent blood work.  I have ordered labs to be done when he has follow-up with Dr. Alyson Ingles.      Relevant Medications   amLODipine (NORVASC) 10 MG tablet   Other Relevant Orders   Lipid panel   Anemia   Relevant Orders   CBC    Meds ordered this encounter  Medications   amLODipine (NORVASC) 10 MG tablet    Sig: Take 1 tablet (10 mg total) by mouth daily. PLEASE SCHEDULE APPT FOR FUTURE REFILLS    Dispense:  90 tablet    Refill:  3    Follow-up:  Return in about 6 months (around 06/15/2022).  [place]

## 2021-12-16 NOTE — Assessment & Plan Note (Signed)
Stable. Continue Norvasc. Refilled today. 

## 2021-12-16 NOTE — Assessment & Plan Note (Signed)
Uncontrolled.  Patient has not had any recent blood work.  I have ordered labs to be done when he has follow-up with Dr. Alyson Ingles.

## 2021-12-16 NOTE — Assessment & Plan Note (Signed)
I spoke with Dr. Alyson Ingles today.  I informed him that he has been taking Keflex as needed as opposed to daily as recommended.  Dr. Alyson Ingles advised against pain medication as his issues with dysuria have been self-induced from squeezing and Valsalva.  Dr. Alyson Ingles recommended continuing medications like Uribel and Azo to help with dysuria.

## 2021-12-16 NOTE — Patient Instructions (Signed)
Continue your medication.  I will talk with Dr. Alyson Ingles.  Follow up in 6 months.  Take care  Dr. Lacinda Axon

## 2021-12-26 ENCOUNTER — Other Ambulatory Visit: Payer: Self-pay | Admitting: Urology

## 2021-12-26 DIAGNOSIS — R300 Dysuria: Secondary | ICD-10-CM

## 2022-03-09 ENCOUNTER — Other Ambulatory Visit: Payer: Self-pay | Admitting: Urology

## 2022-05-01 ENCOUNTER — Other Ambulatory Visit: Payer: Medicare Other

## 2022-05-04 ENCOUNTER — Other Ambulatory Visit: Payer: Medicare Other

## 2022-05-04 ENCOUNTER — Other Ambulatory Visit: Payer: Self-pay

## 2022-05-04 ENCOUNTER — Telehealth: Payer: Self-pay

## 2022-05-04 DIAGNOSIS — C61 Malignant neoplasm of prostate: Secondary | ICD-10-CM

## 2022-05-04 MED ORDER — ALFUZOSIN HCL ER 10 MG PO TB24: 10.0000 mg | ORAL_TABLET | Freq: Every day | ORAL | 11 refills | Status: DC

## 2022-05-04 NOTE — Telephone Encounter (Signed)
Patient came by office to ask if Dr. Alyson Ingles will change flomax back to Afluzosin. Approval received and medication sent to pharmacy

## 2022-05-05 LAB — PSA: Prostate Specific Ag, Serum: 1 ng/mL (ref 0.0–4.0)

## 2022-05-08 ENCOUNTER — Ambulatory Visit: Payer: Medicare Other | Admitting: Urology

## 2022-05-12 ENCOUNTER — Ambulatory Visit (INDEPENDENT_AMBULATORY_CARE_PROVIDER_SITE_OTHER): Payer: Medicare Other | Admitting: Urology

## 2022-05-12 ENCOUNTER — Encounter: Payer: Self-pay | Admitting: Urology

## 2022-05-12 VITALS — BP 157/82 | HR 108

## 2022-05-12 DIAGNOSIS — Z8546 Personal history of malignant neoplasm of prostate: Secondary | ICD-10-CM

## 2022-05-12 DIAGNOSIS — N529 Male erectile dysfunction, unspecified: Secondary | ICD-10-CM

## 2022-05-12 DIAGNOSIS — R300 Dysuria: Secondary | ICD-10-CM | POA: Diagnosis not present

## 2022-05-12 DIAGNOSIS — N3281 Overactive bladder: Secondary | ICD-10-CM | POA: Diagnosis not present

## 2022-05-12 DIAGNOSIS — N401 Enlarged prostate with lower urinary tract symptoms: Secondary | ICD-10-CM

## 2022-05-12 DIAGNOSIS — N138 Other obstructive and reflux uropathy: Secondary | ICD-10-CM

## 2022-05-12 DIAGNOSIS — C61 Malignant neoplasm of prostate: Secondary | ICD-10-CM

## 2022-05-12 MED ORDER — CEPHALEXIN 500 MG PO CAPS: 500.0000 mg | ORAL_CAPSULE | Freq: Every day | ORAL | 11 refills | Status: DC | PRN

## 2022-05-12 MED ORDER — TAMSULOSIN HCL 0.4 MG PO CAPS: 0.4000 mg | ORAL_CAPSULE | Freq: Every day | ORAL | 11 refills | Status: DC

## 2022-05-12 MED ORDER — FINASTERIDE 5 MG PO TABS: 5.0000 mg | ORAL_TABLET | Freq: Every day | ORAL | 3 refills | Status: DC

## 2022-05-12 MED ORDER — OXYCODONE-ACETAMINOPHEN 5-325 MG PO TABS: 1.0000 | ORAL_TABLET | ORAL | 0 refills | Status: DC | PRN

## 2022-05-12 MED ORDER — MEGESTROL ACETATE 20 MG PO TABS: 20.0000 mg | ORAL_TABLET | Freq: Three times a day (TID) | ORAL | 11 refills | Status: DC | PRN

## 2022-05-12 MED ORDER — OXYBUTYNIN CHLORIDE ER 15 MG PO TB24: 15.0000 mg | ORAL_TABLET | Freq: Every day | ORAL | 3 refills | Status: DC

## 2022-05-12 MED ORDER — SILDENAFIL CITRATE 100 MG PO TABS: 100.0000 mg | ORAL_TABLET | Freq: Every day | ORAL | 3 refills | Status: DC | PRN

## 2022-05-12 NOTE — Addendum Note (Signed)
Addended by: Cleon Gustin on: 05/12/2022 11:32 AM   Modules accepted: Orders

## 2022-05-12 NOTE — Progress Notes (Signed)
05/12/2022 11:24 AM   Tony Powell 11/23/1950 [id number]  Referring provider: Erven Colla, DO [address],  [place] [address]  Followup OAB, Prostate cancer and dysuria   HPI: Tony Powell is a 71yo here for followup for prostate cancer, OAB, and dysuria. PSA decreased to 1.0 from 1.4. He takes ditropan '15mg'$  daily. IPSS 21 QOL 3 on uroxatral '10mg'$ . Urine stream strong. Nocturia 2-3x. He takes keflex self start for UTI. He took to a dose once since last visit. He denies any dysuria   PMH: Past Medical History:  Diagnosis Date   Abnormal stress test    Borderline hypertension    Chest discomfort    Atypical   DJD (degenerative joint disease)    Knees, lumbar sacral spine   Hyperlipidemia    Hypertension    Prostate cancer [facility])     Surgical History: Past Surgical History:  Procedure Laterality Date   BACK SURGERY     CYSTOSCOPY WITH FULGERATION  04/18/2021   Procedure: CYSTOSCOPY WITH FULGERATION of BLADDER NECK;  Surgeon: Cleon Gustin, MD;  Location: [facility];  Service: Urology;;   CYSTOSCOPY WITH INJECTION N/A 04/18/2021   Procedure: CYSTOSCOPY WITH INJECTION- Botox 100 units;  Surgeon: Cleon Gustin, MD;  Location: [facility];  Service: Urology;  Laterality: N/A;   TRANSURETHRAL RESECTION OF BLADDER TUMOR N/A 08/25/2020   Procedure: TRANSURETHRAL RESECTION OF BLADDER TUMOR (TURBT);  Surgeon: Cleon Gustin, MD;  Location: [facility];  Service: Urology;  Laterality: N/A;    Home Medications:  Allergies as of 05/12/2022       Reactions   Celebrex [celecoxib]    Eyes drooping and messed up skin on hands        Medication List        Accurate as of May 12, 2022 11:24 AM. If you have any questions, ask your nurse or doctor.          alfuzosin 10 MG 24 hr tablet Commonly known as: UROXATRAL Take 1 tablet (10 mg total) by mouth daily with breakfast.   amLODipine 10 MG tablet Commonly known as: NORVASC Take 1 tablet (10 mg total) by mouth  daily. PLEASE SCHEDULE APPT FOR FUTURE REFILLS   cephALEXin 500 MG capsule Commonly known as: KEFLEX Take 1 capsule (500 mg total) by mouth daily as needed.   cyanocobalamin 1000 MCG tablet Take 1,000 mcg by mouth daily.   finasteride 5 MG tablet Commonly known as: PROSCAR Take 1 tablet (5 mg total) by mouth daily.   megestrol 20 MG tablet Commonly known as: MEGACE Take 1 tablet (20 mg total) by mouth 3 (three) times daily as needed.   oxybutynin 15 MG 24 hr tablet Commonly known as: DITROPAN XL Take 1 tablet (15 mg total) by mouth daily.   oxyCODONE-acetaminophen 5-325 MG tablet Commonly known as: Percocet Take 1 tablet by mouth every 4 (four) hours as needed.   sildenafil 100 MG tablet Commonly known as: VIAGRA Take 1 tablet (100 mg total) by mouth daily as needed for erectile dysfunction.        Allergies:  Allergies  Allergen Reactions   Celebrex [Celecoxib]     Eyes drooping and messed up skin on hands    Family History: Family History  Problem Relation Age of Onset   Heart attack Mother    Breast cancer Mother        Mastectomy   Leukemia Father    Cancer Brother    Cancer Brother  Cancer Brother    Cancer Brother    Cancer Brother    Colon cancer Neg Hx    Prostate cancer Neg Hx    Pancreatic cancer Neg Hx     Social History:  reports that he quit smoking about 23 years ago. His smoking use included cigarettes. He has a 20.00 pack-year smoking history. He has never used smokeless tobacco. He reports that he does not drink alcohol and does not use drugs.  ROS: All other review of systems were reviewed and are negative except what is noted above in HPI  Physical Exam: BP (!) 157/82   Pulse (!) 108   Constitutional:  Alert and oriented, No acute distress. HEENT: [place] AT, moist mucus membranes.  Trachea midline, no masses. Cardiovascular: No clubbing, cyanosis, or edema. Respiratory: Normal respiratory effort, no increased work of breathing. GI:  Abdomen is soft, nontender, nondistended, no abdominal masses GU: No CVA tenderness.  Lymph: No cervical or inguinal lymphadenopathy. Skin: No rashes, bruises or suspicious lesions. Neurologic: Grossly intact, no focal deficits, moving all 4 extremities. Psychiatric: Normal mood and affect.  Laboratory Data: Lab Results  Component Value Date   WBC 5.8 07/31/2021   HGB 10.9 (L) 07/31/2021   HCT 34.7 (L) 07/31/2021   MCV 82.2 07/31/2021   PLT 316 07/31/2021    Lab Results  Component Value Date   CREATININE 1.14 07/31/2021    No results found for: "PSA"  No results found for: "TESTOSTERONE"  No results found for: "HGBA1C"  Urinalysis    Component Value Date/Time   COLORURINE RED (A) 07/31/2021 1607   APPEARANCEUR Clear 11/09/2021 1129   LABSPEC 1.005 07/31/2021 1607   PHURINE 8.0 07/31/2021 1607   GLUCOSEU Negative 11/09/2021 1129   HGBUR LARGE (A) 07/31/2021 1607   BILIRUBINUR Negative 11/09/2021 1129   [place] 07/31/2021 1607   PROTEINUR Trace (A) 11/09/2021 1129   PROTEINUR 100 (A) 07/31/2021 1607   NITRITE Negative 11/09/2021 1129   NITRITE NEGATIVE 07/31/2021 1607   LEUKOCYTESUR Negative 11/09/2021 1129   LEUKOCYTESUR MODERATE (A) 07/31/2021 1607    Lab Results  Component Value Date   LABMICR See below: 11/09/2021   WBCUA None seen 11/09/2021   LABEPIT >10 (A) 11/09/2021   MUCUS Present 11/09/2021   BACTERIA Few 11/09/2021    Pertinent Imaging:  No results found for this or any previous visit.  No results found for this or any previous visit.  No results found for this or any previous visit.  No results found for this or any previous visit.  No results found for this or any previous visit.  No results found for this or any previous visit.  No results found for this or any previous visit.  No results found for this or any previous visit.   Assessment & Plan:    1. Overactive bladder -Continue ditropan '15mg'$  daily  2. Prostate  cancer ([place]) -RTC 6 months with PSA  3. Benign prostatic hyperplasia with urinary obstruction -Continue uroxatral '10mg'$  daily and finasteride '5mg'$  daily  4. Dysuria -resolved   No follow-ups on file.  [name], MD  [facility] [place]

## 2022-05-12 NOTE — Patient Instructions (Signed)

## 2022-08-17 ENCOUNTER — Ambulatory Visit (HOSPITAL_COMMUNITY)
Admission: RE | Admit: 2022-08-17 | Discharge: 2022-08-17 | Disposition: A | Discharge status: Home / Self Care | Payer: Medicare Other | Source: Ambulatory Visit | Attending: Family Medicine | Admitting: Family Medicine

## 2022-08-17 ENCOUNTER — Ambulatory Visit (INDEPENDENT_AMBULATORY_CARE_PROVIDER_SITE_OTHER): Payer: Medicare Other | Admitting: Family Medicine

## 2022-08-17 ENCOUNTER — Ambulatory Visit: Payer: Medicare Other | Admitting: Family Medicine

## 2022-08-17 ENCOUNTER — Encounter: Payer: Self-pay | Admitting: Family Medicine

## 2022-08-17 VITALS — BP 163/84 | HR 82 | Wt 246.6 lb

## 2022-08-17 DIAGNOSIS — I10 Essential (primary) hypertension: Secondary | ICD-10-CM

## 2022-08-17 DIAGNOSIS — N401 Enlarged prostate with lower urinary tract symptoms: Secondary | ICD-10-CM

## 2022-08-17 DIAGNOSIS — M25512 Pain in left shoulder: Secondary | ICD-10-CM | POA: Diagnosis present

## 2022-08-17 DIAGNOSIS — H539 Unspecified visual disturbance: Secondary | ICD-10-CM | POA: Diagnosis not present

## 2022-08-17 DIAGNOSIS — N138 Other obstructive and reflux uropathy: Secondary | ICD-10-CM

## 2022-08-17 DIAGNOSIS — E785 Hyperlipidemia, unspecified: Secondary | ICD-10-CM

## 2022-08-17 DIAGNOSIS — M25561 Pain in right knee: Secondary | ICD-10-CM

## 2022-08-17 DIAGNOSIS — D649 Anemia, unspecified: Secondary | ICD-10-CM | POA: Diagnosis not present

## 2022-08-17 DIAGNOSIS — G8929 Other chronic pain: Secondary | ICD-10-CM | POA: Insufficient documentation

## 2022-08-17 DIAGNOSIS — N3281 Overactive bladder: Secondary | ICD-10-CM

## 2022-08-17 MED ORDER — OXYCODONE-ACETAMINOPHEN 5-325 MG PO TABS: 1.0000 | ORAL_TABLET | Freq: Three times a day (TID) | ORAL | 0 refills | Status: DC | PRN

## 2022-08-17 MED ORDER — VALSARTAN 160 MG PO TABS: 160.0000 mg | ORAL_TABLET | Freq: Every day | ORAL | 3 refills | Status: DC

## 2022-08-17 NOTE — Assessment & Plan Note (Signed)
Uncontrolled and having side effects of treatment.  Stopping amlodipine.  Starting valsartan.

## 2022-08-17 NOTE — Patient Instructions (Signed)
Stopped amlodipine.  You can stop the medications that are not working well for you.  Pain medication as directed.  Labs today.  X-rays today as well.  Take care  Dr. Lacinda Axon

## 2022-08-17 NOTE — Assessment & Plan Note (Signed)
During history today, patient reported pain in the right knee as well as left shoulder.  He also states that he "hurts all over".  He states that this started after he had radiation.  Brief supply of pain medication was sent to the pharmacy.  If he continues to have ongoing chronic need for opiates will refer to pain management.

## 2022-08-17 NOTE — Assessment & Plan Note (Signed)
Referring to ophthalmology.

## 2022-08-17 NOTE — Assessment & Plan Note (Signed)
I discontinued patient's Uroxatrol today as he has not been taking this on a regular basis.

## 2022-08-17 NOTE — Assessment & Plan Note (Signed)
X-ray today for further evaluation. 

## 2022-08-17 NOTE — Assessment & Plan Note (Signed)
Discontinued oxybutynin as he is not taking this regularly and he states that it does not seem to help.

## 2022-08-17 NOTE — Assessment & Plan Note (Signed)
Further evaluation today with x-ray.  Suspected degenerative changes.

## 2022-08-17 NOTE — Progress Notes (Signed)
Subjective:  Patient ID: Tony Powell, male    DOB: 1950-11-25  Age: 71 y.o. MRN: [id number]  CC: Chief Complaint  Patient presents with   Eye Problem    Pt needing referral to eye doctor. Last seen eye dr 2 years ago. Pt having cloudy vision-just recently has began. Pt states he took radiation last January and states that when all this pain began.  Right knee pain, unsteady balance, pain in left shoulder and slow metabolism.     HPI:  71 year old male with hypertension, COPD, BPH, history of bladder tumor, overactive bladder, history of prostate cancer, anemia, hyperlipidemia presents with multiple complaints.  Patient reports that he has had recent vision changes.  He states that his vision is "cloudy".  He states that this has been going on for the past 3 weeks.  He has not seen an eye doctor in 2 years.  Needs referral.  Patient has underlying hyperlipidemia which has not been addressed.  Also has anemia which has not been reevaluated.  Patient previously refused labs at her last visit.  Needs laboratory studies for further evaluation regarding anemia as well as his lipids.  Patient's hypertension is uncontrolled.  Patient endorses ongoing lower extremity edema.  This is likely from amlodipine.  We will need to discontinue amlodipine and replace it with alternate pharmacotherapy.  Patient is not taking Uroxatrol on a regular basis and is not taking oxybutynin as well.  He states that these medications do not seem to help his urinary symptoms.  In fact, he states that they seem to worsen his urinary symptoms.  He is also taking Megace.  He states that he was taking this for "hot flashes".  At this point time, I do not think that this is necessary.  This medication is known to cause a number of side effects including hypertension.  He also reports that he has ongoing pain particularly of the right knee and of the left shoulder.  Left shoulder pain worsened recently after lifting a lot of  furniture and boxes while he was moving.  He has chronic back pain as well.  Has had prior surgery.  Patient has previously been prescribed opioids by urology as well as radiation oncology.  He is requesting pain medication today.  Needs further evaluation with imaging regarding his musculoskeletal complaints.   Patient Active Problem List   Diagnosis Date Noted   Vision changes 08/17/2022   Other chronic pain 08/17/2022   Chronic pain of right knee 08/17/2022   Acute pain of left shoulder 08/17/2022   Hyperlipidemia 12/16/2021   Anemia 12/16/2021   Emphysema/COPD ([place]) 12/16/2021   Essential hypertension 12/16/2021   OAB (overactive bladder) 02/02/2021   Prostate cancer ([place]) 08/30/2020   Bladder tumor 08/23/2020   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/13/2020    Social Hx   Social History   Socioeconomic History   Marital status: Divorced    Spouse name: Not on file   Number of children: 2   Years of education: Not on file   Highest education level: Not on file  Occupational History   Occupation: Long Production manager: [employer]  Tobacco Use   Smoking status: Former    Packs/day: 1.00    Years: 20.00    Total pack years: 20.00    Types: Cigarettes    Quit date: 11/13/1998    Years since quitting: 23.7   Smokeless tobacco: Never  Substance and Sexual Activity  Alcohol use: No   Drug use: Never   Sexual activity: Not Currently  Other Topics Concern   Not on file  Social History Narrative   Divorced   Social Determinants of Health   Financial Resource Strain: Not on file  Food Insecurity: Not on file  Transportation Needs: Not on file  Physical Activity: Not on file  Stress: Not on file  Social Connections: Not on file    Review of Systems Per HPI  Objective:  BP (!) 163/84   Pulse 82   Wt 246 lb 9.6 oz (111.9 kg)   SpO2 99%   BMI 35.38 kg/m      08/17/2022   11:34 AM 08/17/2022   11:24 AM 05/12/2022   11:06  AM  BP/Weight  Systolic BP 950 932 671  Diastolic BP 84 78 82  Wt. (Lbs)  246.6   BMI  35.38 kg/m2     Physical Exam Constitutional:      General: He is not in acute distress.    Appearance: Normal appearance.  HENT:     Head: Normocephalic and atraumatic.  Eyes:     General:        Right eye: No discharge.        Left eye: No discharge.     Conjunctiva/sclera: Conjunctivae normal.  Cardiovascular:     Rate and Rhythm: Normal rate and regular rhythm.  Pulmonary:     Effort: Pulmonary effort is normal.     Breath sounds: Normal breath sounds. No wheezing, rhonchi or rales.  Musculoskeletal:     Comments: Tenderness over the posterior aspect of the left shoulder.  Patient has tenderness of the lumbar spine as well.  No appreciable abnormalities of the right knee.  Neurological:     Mental Status: He is alert.  Psychiatric:        Mood and Affect: Mood normal.        Behavior: Behavior normal.     Lab Results  Component Value Date   WBC 5.8 07/31/2021   HGB 10.9 (L) 07/31/2021   HCT 34.7 (L) 07/31/2021   PLT 316 07/31/2021   GLUCOSE 92 07/31/2021   CHOL 226 (H) 12/18/2018   TRIG 167 (H) 12/18/2018   HDL 40 12/18/2018   LDLCALC 153 (H) 12/18/2018   ALT 13 02/09/2021   AST 19 02/09/2021   NA 138 07/31/2021   K 3.3 (L) 07/31/2021   CL 106 07/31/2021   CREATININE 1.14 07/31/2021   BUN 8 07/31/2021   CO2 26 07/31/2021   TSH 2.140 04/29/2010   INR 1.0 07/31/2021     Assessment & Plan:   Problem List Items Addressed This Visit       Cardiovascular and Mediastinum   Essential hypertension    Uncontrolled and having side effects of treatment.  Stopping amlodipine.  Starting valsartan.      Relevant Medications   valsartan (DIOVAN) 160 MG tablet   Other Relevant Orders   CMP14+EGFR     Genitourinary   Benign prostatic hyperplasia with urinary obstruction    I discontinued patient's Uroxatrol today as he has not been taking this on a regular basis.       OAB (overactive bladder)    Discontinued oxybutynin as he is not taking this regularly and he states that it does not seem to help.        Other   Acute pain of left shoulder    X-ray today for further evaluation.  Relevant Orders   DG Shoulder Left (Completed)   Anemia - Primary    Patient is getting laboratory studies today for further evaluation.  He has never had a colonoscopy.  If remains anemic will need colonoscopy.      Relevant Orders   CBC   Vitamin B12   Folate   Iron, TIBC and Ferritin Panel   Chronic pain of right knee    Further evaluation today with x-ray.  Suspected degenerative changes.      Relevant Orders   DG Knee Complete 4 Views Right (Completed)   Hyperlipidemia    Lipid panel today to assess.      Relevant Medications   valsartan (DIOVAN) 160 MG tablet   Other Relevant Orders   Lipid panel   Other chronic pain    During history today, patient reported pain in the right knee as well as left shoulder.  He also states that he "hurts all over".  He states that this started after he had radiation.  Brief supply of pain medication was sent to the pharmacy.  If he continues to have ongoing chronic need for opiates will refer to pain management.      Relevant Medications   oxyCODONE-acetaminophen (PERCOCET) 5-325 MG tablet   Vision changes    Referring to ophthalmology.      Relevant Orders   Ambulatory referral to Ophthalmology    Meds ordered this encounter  Medications   valsartan (DIOVAN) 160 MG tablet    Sig: Take 1 tablet (160 mg total) by mouth daily.    Dispense:  90 tablet    Refill:  3   oxyCODONE-acetaminophen (PERCOCET) 5-325 MG tablet    Sig: Take 1-2 tablets by mouth every 8 (eight) hours as needed for severe pain or moderate pain.    Dispense:  30 tablet    Refill:  [place] controlled substance database was reviewed today.  Follow-up:  3-6 months  [place] DO [place]

## 2022-08-17 NOTE — Assessment & Plan Note (Signed)
Lipid panel today to assess. 

## 2022-08-17 NOTE — Assessment & Plan Note (Signed)
Patient is getting laboratory studies today for further evaluation.  He has never had a colonoscopy.  If remains anemic will need colonoscopy.

## 2022-08-18 LAB — IRON,TIBC AND FERRITIN PANEL
Ferritin: 35 ng/mL (ref 30–400)
Iron Saturation: 15 % (ref 15–55)
Iron: 64 ug/dL (ref 38–169)
Total Iron Binding Capacity: 436 ug/dL (ref 250–450)
UIBC: 372 ug/dL — ABNORMAL HIGH (ref 111–343)

## 2022-08-18 LAB — CMP14+EGFR
ALT: 6 IU/L (ref 0–44)
AST: 13 IU/L (ref 0–40)
Albumin/Globulin Ratio: 1.7 (ref 1.2–2.2)
Albumin: 4.7 g/dL (ref 3.8–4.8)
Alkaline Phosphatase: 103 IU/L (ref 44–121)
BUN/Creatinine Ratio: 7 — ABNORMAL LOW (ref 10–24)
BUN: 9 mg/dL (ref 8–27)
Bilirubin Total: 0.6 mg/dL (ref 0.0–1.2)
CO2: 21 mmol/L (ref 20–29)
Calcium: 9.8 mg/dL (ref 8.6–10.2)
Chloride: 102 mmol/L (ref 96–106)
Creatinine, Ser: 1.25 mg/dL (ref 0.76–1.27)
Globulin, Total: 2.7 g/dL (ref 1.5–4.5)
Glucose: 87 mg/dL (ref 70–99)
Potassium: 3.7 mmol/L (ref 3.5–5.2)
Sodium: 141 mmol/L (ref 134–144)
Total Protein: 7.4 g/dL (ref 6.0–8.5)
eGFR: 62 mL/min/{1.73_m2} (ref >59)

## 2022-08-18 LAB — LIPID PANEL
Chol/HDL Ratio: 4.7 ratio (ref 0.0–5.0)
Cholesterol, Total: 214 mg/dL — ABNORMAL HIGH (ref 100–199)
HDL: 46 mg/dL (ref >39)
LDL Chol Calc (NIH): 147 mg/dL — ABNORMAL HIGH (ref 0–99)
Triglycerides: 117 mg/dL (ref 0–149)
VLDL Cholesterol Cal: 21 mg/dL (ref 5–40)

## 2022-08-18 LAB — CBC
Hematocrit: 39.4 % (ref 37.5–51.0)
Hemoglobin: 12.6 g/dL — ABNORMAL LOW (ref 13.0–17.7)
MCH: 26 pg — ABNORMAL LOW (ref 26.6–33.0)
MCHC: 32 g/dL (ref 31.5–35.7)
MCV: 81 fL (ref 79–97)
Platelets: 328 10*3/uL (ref 150–450)
RBC: 4.85 x10E6/uL (ref 4.14–5.80)
RDW: 14.7 % (ref 11.6–15.4)
WBC: 6.9 10*3/uL (ref 3.4–10.8)

## 2022-08-18 LAB — FOLATE: Folate: 6.3 ng/mL (ref >3.0)

## 2022-08-18 LAB — VITAMIN B12: Vitamin B-12: 416 pg/mL (ref 232–1245)

## 2022-08-20 ENCOUNTER — Other Ambulatory Visit: Payer: Self-pay | Admitting: Family Medicine

## 2022-08-20 MED ORDER — IRON (FERROUS SULFATE) 325 (65 FE) MG PO TABS: 325.0000 mg | ORAL_TABLET | ORAL | 1 refills | Status: DC

## 2022-08-21 NOTE — Addendum Note (Signed)
Addended by: Dairl Ponder on: 08/21/2022 03:16 PM   Modules accepted: Orders

## 2022-08-25 ENCOUNTER — Other Ambulatory Visit: Payer: Self-pay | Admitting: Family Medicine

## 2022-08-25 ENCOUNTER — Other Ambulatory Visit: Payer: Self-pay

## 2022-08-25 ENCOUNTER — Telehealth: Payer: Self-pay | Admitting: Family Medicine

## 2022-08-25 DIAGNOSIS — N529 Male erectile dysfunction, unspecified: Secondary | ICD-10-CM

## 2022-08-25 MED ORDER — ATORVASTATIN CALCIUM 40 MG PO TABS: 40.0000 mg | ORAL_TABLET | Freq: Every day | ORAL | 3 refills | Status: DC

## 2022-08-25 MED ORDER — SILDENAFIL CITRATE 100 MG PO TABS: 100.0000 mg | ORAL_TABLET | Freq: Every day | ORAL | 5 refills | Status: DC | PRN

## 2022-08-25 NOTE — Telephone Encounter (Signed)
Medication sent in. Pt wanted Sildenafil to [facility]resent to [facility]. Pt states [facility] is about to close so he will have to get Lipitor on Monday.

## 2022-08-25 NOTE — Telephone Encounter (Signed)
Pt called in to discuss what nurse called him talking about something about liquid to drink for colonoscopy. Informed pt that we have no records of anyone calling about colonoscopy except for the referral. Pt verbalized understanding.   Pt would like Sildenafil 100 mg #30 5 refills sent to [facility]. Pt is OK with lipid therapy-would like that sent to [facility]. Please advise. Thank you

## 2022-08-28 ENCOUNTER — Encounter (INDEPENDENT_AMBULATORY_CARE_PROVIDER_SITE_OTHER): Payer: Self-pay | Admitting: *Deleted

## 2022-09-13 DIAGNOSIS — I10 Essential (primary) hypertension: Secondary | ICD-10-CM | POA: Insufficient documentation

## 2022-09-14 ENCOUNTER — Ambulatory Visit (INDEPENDENT_AMBULATORY_CARE_PROVIDER_SITE_OTHER): Payer: Medicare Other

## 2022-09-14 VITALS — Ht 70.0 in | Wt 246.0 lb

## 2022-09-14 DIAGNOSIS — Z0000 Encounter for general adult medical examination without abnormal findings: Secondary | ICD-10-CM | POA: Diagnosis not present

## 2022-09-14 NOTE — Progress Notes (Signed)
Virtual Visit via Telephone Note  I connected with  Tony Powell on 09/14/22 at 11:15 AM EDT by telephone and verified that I am speaking with the correct person using two identifiers.  Location: Patient: home Provider: [facility] Persons participating in the virtual visit: patient/Nurse Health Advisor   I discussed the limitations, risks, security and privacy concerns of performing an evaluation and management service by telephone and the availability of in person appointments. The patient expressed understanding and agreed to proceed.  Interactive audio and video telecommunications were attempted between this nurse and patient, however failed, due to patient having technical difficulties OR patient did not have access to video capability.  We continued and completed visit with audio only.  Some vital signs may be absent or patient reported.   Dionisio David, LPN  Subjective:   Tony Powell is a 71 y.o. male who presents for Medicare Annual/Subsequent preventive examination.  Review of Systems     Cardiac Risk Factors include: advanced age (>82mn, >>80women);hypertension;dyslipidemia     Objective:    There were no vitals filed for this visit. There is no height or weight on file to calculate BMI.     09/14/2022   11:20 AM 07/31/2021    3:19 PM 04/14/2021   10:01 AM 02/26/2021    1:37 PM 01/22/2021    5:19 PM 01/21/2021   10:52 AM 09/24/2020    1:32 PM  Advanced Directives  Does Patient Have a Medical Advance Directive? No No No No  No No  Would patient like information on creating a medical advance directive? No - Patient declined  No - Patient declined No - Patient declined No - Patient declined  No - Patient declined    Current Medications (verified) Outpatient Encounter Medications as of 09/14/2022  Medication Sig   atorvastatin (LIPITOR) 40 MG tablet Take 1 tablet (40 mg total) by mouth daily.   cyanocobalamin 1000 MCG tablet Take 1,000 mcg by mouth daily.   finasteride  (PROSCAR) 5 MG tablet Take 1 tablet (5 mg total) by mouth daily.   Iron, Ferrous Sulfate, 325 (65 Fe) MG TABS Take 325 mg by mouth every other day.   oxyCODONE-acetaminophen (PERCOCET) 5-325 MG tablet Take 1-2 tablets by mouth every 8 (eight) hours as needed for severe pain or moderate pain.   sildenafil (VIAGRA) 100 MG tablet Take 1 tablet (100 mg total) by mouth daily as needed for erectile dysfunction.   valsartan (DIOVAN) 160 MG tablet Take 1 tablet (160 mg total) by mouth daily.   No facility-administered encounter medications on file as of 09/14/2022.    Allergies (verified) Celebrex [celecoxib]   History: Past Medical History:  Diagnosis Date   Abnormal stress test    Borderline hypertension    Chest discomfort    Atypical   DJD (degenerative joint disease)    Knees, lumbar sacral spine   Hyperlipidemia    Hypertension    Prostate cancer ([facility]    Past Surgical History:  Procedure Laterality Date   BACK SURGERY     CYSTOSCOPY WITH FULGERATION  04/18/2021   Procedure: CYSTOSCOPY WITH FULGERATION of BLADDER NECK;  Surgeon: MCleon Gustin MD;  Location: [facility];  Service: Urology;;   CYSTOSCOPY WITH INJECTION N/A 04/18/2021   Procedure: CYSTOSCOPY WITH INJECTION- Botox 100 units;  Surgeon: MCleon Gustin MD;  Location: [facility];  Service: Urology;  Laterality: N/A;   TRANSURETHRAL RESECTION OF BLADDER TUMOR N/A 08/25/2020   Procedure: TRANSURETHRAL RESECTION OF BLADDER  TUMOR (TURBT);  Surgeon: Cleon Gustin, MD;  Location: [facility];  Service: Urology;  Laterality: N/A;   Family History  Problem Relation Age of Onset   Heart attack Mother    Breast cancer Mother        Mastectomy   Leukemia Father    Cancer Brother    Cancer Brother    Cancer Brother    Cancer Brother    Cancer Brother    Colon cancer Neg Hx    Prostate cancer Neg Hx    Pancreatic cancer Neg Hx    Social History   Socioeconomic History   Marital status: Divorced    Spouse name: Not on  file   Number of children: 2   Years of education: Not on file   Highest education level: Not on file  Occupational History   Occupation: Long Production manager: [employer]  Tobacco Use   Smoking status: Former    Packs/day: 1.00    Years: 20.00    Total pack years: 20.00    Types: Cigarettes    Quit date: 11/13/1998    Years since quitting: 23.8   Smokeless tobacco: Never  Substance and Sexual Activity   Alcohol use: No   Drug use: Never   Sexual activity: Not Currently  Other Topics Concern   Not on file  Social History Narrative   Divorced   Social Determinants of Health   Financial Resource Strain: Low Risk  (09/14/2022)   Overall Financial Resource Strain (CARDIA)    Difficulty of Paying Living Expenses: Not hard at all  Food Insecurity: No Food Insecurity (09/14/2022)   Hunger Vital Sign    Worried About Running Out of Food in the Last Year: Never true    Ran Out of Food in the Last Year: Never true  Transportation Needs: Not on file  Physical Activity: Inactive (09/14/2022)   Exercise Vital Sign    Days of Exercise per Week: 0 days    Minutes of Exercise per Session: 0 min  Stress: No Stress Concern Present (09/14/2022)   [employer] [place]    Feeling of Stress : Only a little  Social Connections: Socially Isolated (09/14/2022)   Social Connection and Isolation Panel [NHANES]    Frequency of Communication with Friends and Family: Twice a week    Frequency of Social Gatherings with Friends and Family: Never    Attends Religious Services: 1 to 4 times per year    Active Member of [employer] or Organizations: No    Attends Music therapist: Never    Marital Status: Divorced    Tobacco Counseling Counseling given: Not Answered   Clinical Intake:  Pre-visit preparation completed: Yes  Pain : No/denies pain     Nutritional Risks: None Diabetes: No  How often do you need to  have someone help you when you read instructions, pamphlets, or other written materials from your doctor or pharmacy?: 1 - Never  Diabetic?no  Interpreter Needed?: No  Information entered by :: Kirke Shaggy, LPN   Activities of Daily Living    09/14/2022   11:21 AM  In your present state of health, do you have any difficulty performing the following activities:  Hearing? 0  Vision? 0  Difficulty concentrating or making decisions? 0  Walking or climbing stairs? 1  Dressing or bathing? 0  Doing errands, shopping? 0  Preparing Food and eating ? N  Using  the Toilet? N  In the past six months, have you accidently leaked urine? N  Do you have problems with loss of bowel control? N  Managing your Medications? N  Managing your Finances? N  Housekeeping or managing your Housekeeping? N    Patient Care Team: Coral Spikes, DO as PCP - General (Family Medicine) Cira Rue, RN Nurse Navigator as Registered Nurse (Medical Oncology)  Indicate any recent [place] you may have received from other than [facility] providers in the past year (date may be approximate).     Assessment:   This is a routine wellness examination for [place].  Hearing/Vision screen Hearing Screening - Comments:: No aids Vision Screening - Comments:: Wears glasses- Dr.Groat  Dietary issues and exercise activities discussed: Current Exercise Habits: Home exercise routine, Time (Minutes): 20, Frequency (Times/Week): 2, Weekly Exercise (Minutes/Week): 40, Intensity: Mild   Goals Addressed             This Visit's Progress    DIET - EAT MORE FRUITS AND VEGETABLES         Depression Screen    09/14/2022   11:14 AM 12/16/2021   10:15 AM 10/03/2017   10:07 AM  PHQ 2/9 Scores  PHQ - 2 Score 1 0 0    Fall Risk    09/14/2022   11:21 AM 12/16/2021   10:14 AM 10/08/2019   11:55 AM 06/17/2018   10:21 AM  Fall Risk   Falls in the past year? 0 0 0 No  Comment   [employer] Telephone Survey: data to providers prior  to load [employer] Survey: data to providers prior to load  Number falls in past yr: 0 0    Injury with Fall? 0 0    Risk for fall due to : No Fall Risks No Fall Risks    Follow up Falls prevention discussed;Falls evaluation completed Falls evaluation completed      FALL RISK PREVENTION PERTAINING TO THE HOME:  Any stairs in or around the home? No  If so, are there any without handrails? No  Home free of loose throw rugs in walkways, pet beds, electrical cords, etc? Yes  Adequate lighting in your home to reduce risk of falls? Yes   ASSISTIVE DEVICES UTILIZED TO PREVENT FALLS:  Life alert? No  Use of a cane, walker or w/c? No  Grab bars in the bathroom? Yes  Shower chair or bench in shower? No  Elevated toilet seat or a handicapped toilet? No    Cognitive Function:        09/14/2022   11:22 AM  6CIT Screen  What Year? 0 points  What month? 0 points  What time? 0 points  Count back from 20 0 points  Months in reverse 0 points  Repeat phrase 0 points  Total Score 0 points    Immunizations  There is no immunization history on file for this patient.  TDAP status: Due, Education has been provided regarding the importance of this vaccine. Advised may receive this vaccine at local pharmacy or Health Dept. Aware to provide a copy of the vaccination record if obtained from local pharmacy or Health Dept. Verbalized acceptance and understanding.  Flu Vaccine status: Declined, Education has been provided regarding the importance of this vaccine but patient still declined. Advised may receive this vaccine at local pharmacy or Health Dept. Aware to provide a copy of the vaccination record if obtained from local pharmacy or Health Dept. Verbalized acceptance and understanding.  Pneumococcal  vaccine status: Declined,  Education has been provided regarding the importance of this vaccine but patient still declined. Advised may receive this vaccine at local pharmacy or Health Dept.  Aware to provide a copy of the vaccination record if obtained from local pharmacy or Health Dept. Verbalized acceptance and understanding.   Covid-19 vaccine status: Declined, Education has been provided regarding the importance of this vaccine but patient still declined. Advised may receive this vaccine at local pharmacy or Health Dept.or vaccine clinic. Aware to provide a copy of the vaccination record if obtained from local pharmacy or Health Dept. Verbalized acceptance and understanding.  Qualifies for Shingles Vaccine? Yes   Zostavax completed No   Shingrix Completed?: No.    Education has been provided regarding the importance of this vaccine. Patient has been advised to call insurance company to determine out of pocket expense if they have not yet received this vaccine. Advised may also receive vaccine at local pharmacy or Health Dept. Verbalized acceptance and understanding.  Screening Tests Health Maintenance  Topic Date Due   COVID-19 Vaccine (1) Never done   Pneumonia Vaccine 93+ Years old (1 - PCV) Never done   Hepatitis C Screening  Never done   TETANUS/TDAP  Never done   Zoster Vaccines- Shingrix (1 of 2) Never done   COLONOSCOPY (Pts 45-88yr Insurance coverage will need to be confirmed)  Never done   INFLUENZA VACCINE  Never done   Medicare Annual Wellness (AWV)  09/15/2023   HPV VACCINES  Aged Out    Health Maintenance  Health Maintenance Due  Topic Date Due   COVID-19 Vaccine (1) Never done   Pneumonia Vaccine 71 Years old (1 - PCV) Never done   Hepatitis C Screening  Never done   TETANUS/TDAP  Never done   Zoster Vaccines- Shingrix (1 of 2) Never done   COLONOSCOPY (Pts 45-438yrInsurance coverage will need to be confirmed)  Never done   INFLUENZA VACCINE  Never done    Colorectal cancer screening: Type of screening: Colonoscopy. Completed has appt for 12/19. Repeat every 10 years  Lung Cancer Screening: (Low Dose CT Chest recommended if Age 71-80ears, 30  pack-year currently smoking OR have quit w/in 15years.) does not qualify.   Additional Screening:  Hepatitis C Screening: does qualify; Completed no  Vision Screening: Recommended annual ophthalmology exams for early detection of glaucoma and other disorders of the eye. Is the patient up to date with their annual eye exam?  Yes  Who is the provider or what is the name of the office in which the patient attends annual eye exams? Dr.Groat If pt is not established with a provider, would they like to be referred to a provider to establish care? No .   Dental Screening: Recommended annual dental exams for proper oral hygiene  Community Resource Referral / Chronic Care Management: CRR required this visit?  No   CCM required this visit?  No      Plan:     I have personally reviewed and noted the following in the patient's chart:   Medical and social history Use of alcohol, tobacco or illicit drugs  Current medications and supplements including opioid prescriptions. Patient is not currently taking opioid prescriptions. Functional ability and status Nutritional status Physical activity Advanced directives List of other physicians Hospitalizations, surgeries, and ER visits in previous 12 months Vitals Screenings to include cognitive, depression, and falls Referrals and appointments  In addition, I have reviewed and discussed with patient certain preventive  protocols, quality metrics, and best practice recommendations. A written personalized care plan for preventive services as well as general preventive health recommendations were provided to patient.     Dionisio David, LPN   [id number]   Nurse Notes: none

## 2022-09-14 NOTE — Patient Instructions (Signed)
Tony Powell , Thank you for taking time to come for your Medicare Wellness Visit. I appreciate your ongoing commitment to your health goals. Please review the following plan we discussed and let me know if I can assist you in the future.   Screening recommendations/referrals: Colonoscopy: has appt for 10/31/22 Recommended yearly ophthalmology/optometry visit for glaucoma screening and checkup Recommended yearly dental visit for hygiene and checkup  Vaccinations: Influenza vaccine: n/d Pneumococcal vaccine: n/d Tdap vaccine: n/d Shingles vaccine: n/d   Covid-19: n/d  Advanced directives: no  Conditions/risks identified: none  Next appointment: Follow up in one year for your annual wellness visit. 10/02/23 @ 3:40 pm by phone  Preventive Care 65 Years and Older, Male Preventive care refers to lifestyle choices and visits with your health care provider that can promote health and wellness. What does preventive care include? A yearly physical exam. This is also called an annual well check. Dental exams once or twice a year. Routine eye exams. Ask your health care provider how often you should have your eyes checked. Personal lifestyle choices, including: Daily care of your teeth and gums. Regular physical activity. Eating a healthy diet. Avoiding tobacco and drug use. Limiting alcohol use. Practicing safe sex. Taking low doses of aspirin every day. Taking vitamin and mineral supplements as recommended by your health care provider. What happens during an annual well check? The services and screenings done by your health care provider during your annual well check will depend on your age, overall health, lifestyle risk factors, and family history of disease. Counseling  Your health care provider may ask you questions about your: Alcohol use. Tobacco use. Drug use. Emotional well-being. Home and relationship well-being. Sexual activity. Eating habits. History of falls. Memory  and ability to understand (cognition). Work and work Statistician. Screening  You may have the following tests or measurements: Height, weight, and BMI. Blood pressure. Lipid and cholesterol levels. These may be checked every 5 years, or more frequently if you are over 51 years old. Skin check. Lung cancer screening. You may have this screening every year starting at age 68 if you have a 30-pack-year history of smoking and currently smoke or have quit within the past 15 years. Fecal occult blood test (FOBT) of the stool. You may have this test every year starting at age 64. Flexible sigmoidoscopy or colonoscopy. You may have a sigmoidoscopy every 5 years or a colonoscopy every 10 years starting at age 28. Prostate cancer screening. Recommendations will vary depending on your family history and other risks. Hepatitis C blood test. Hepatitis B blood test. Sexually transmitted disease (STD) testing. Diabetes screening. This is done by checking your blood sugar (glucose) after you have not eaten for a while (fasting). You may have this done every 1-3 years. Abdominal aortic aneurysm (AAA) screening. You may need this if you are a current or former smoker. Osteoporosis. You may be screened starting at age 60 if you are at high risk. Talk with your health care provider about your test results, treatment options, and if necessary, the need for more tests. Vaccines  Your health care provider may recommend certain vaccines, such as: Influenza vaccine. This is recommended every year. Tetanus, diphtheria, and acellular pertussis (Tdap, Td) vaccine. You may need a Td booster every 10 years. Zoster vaccine. You may need this after age 38. Pneumococcal 13-valent conjugate (PCV13) vaccine. One dose is recommended after age 45. Pneumococcal polysaccharide (PPSV23) vaccine. One dose is recommended after age 68. Talk to your health  care provider about which screenings and vaccines you need and how often you  need them. This information is not intended to replace advice given to you by your health care provider. Make sure you discuss any questions you have with your health care provider. Document Released: 11/26/2015 Document Revised: 07/19/2016 Document Reviewed: 08/31/2015 Elsevier Interactive Patient Education  2017 [place] Prevention in the Home Falls can cause injuries. They can happen to people of all ages. There are many things you can do to make your home safe and to help prevent falls. What can I do on the outside of my home? Regularly fix the edges of walkways and driveways and fix any cracks. Remove anything that might make you trip as you walk through a door, such as a raised step or threshold. Trim any bushes or trees on the path to your home. Use bright outdoor lighting. Clear any walking paths of anything that might make someone trip, such as rocks or tools. Regularly check to see if handrails are loose or broken. Make sure that both sides of any steps have handrails. Any raised decks and porches should have guardrails on the edges. Have any leaves, snow, or ice cleared regularly. Use sand or salt on walking paths during winter. Clean up any spills in your garage right away. This includes oil or grease spills. What can I do in the bathroom? Use night lights. Install grab bars by the toilet and in the tub and shower. Do not use towel bars as grab bars. Use non-skid mats or decals in the tub or shower. If you need to sit down in the shower, use a plastic, non-slip stool. Keep the floor dry. Clean up any water that spills on the floor as soon as it happens. Remove soap buildup in the tub or shower regularly. Attach bath mats securely with double-sided non-slip rug tape. Do not have throw rugs and other things on the floor that can make you trip. What can I do in the bedroom? Use night lights. Make sure that you have a light by your bed that is easy to reach. Do not  use any sheets or blankets that are too big for your bed. They should not hang down onto the floor. Have a firm chair that has side arms. You can use this for support while you get dressed. Do not have throw rugs and other things on the floor that can make you trip. What can I do in the kitchen? Clean up any spills right away. Avoid walking on wet floors. Keep items that you use a lot in easy-to-reach places. If you need to reach something above you, use a strong step stool that has a grab bar. Keep electrical cords out of the way. Do not use floor polish or wax that makes floors slippery. If you must use wax, use non-skid floor wax. Do not have throw rugs and other things on the floor that can make you trip. What can I do with my stairs? Do not leave any items on the stairs. Make sure that there are handrails on both sides of the stairs and use them. Fix handrails that are broken or loose. Make sure that handrails are as long as the stairways. Check any carpeting to make sure that it is firmly attached to the stairs. Fix any carpet that is loose or worn. Avoid having throw rugs at the top or bottom of the stairs. If you do have throw rugs, attach them to  the floor with carpet tape. Make sure that you have a light switch at the top of the stairs and the bottom of the stairs. If you do not have them, ask someone to add them for you. What else can I do to help prevent falls? Wear shoes that: Do not have high heels. Have rubber bottoms. Are comfortable and fit you well. Are closed at the toe. Do not wear sandals. If you use a stepladder: Make sure that it is fully opened. Do not climb a closed stepladder. Make sure that both sides of the stepladder are locked into place. Ask someone to hold it for you, if possible. Clearly mark and make sure that you can see: Any grab bars or handrails. First and last steps. Where the edge of each step is. Use tools that help you move around (mobility  aids) if they are needed. These include: Canes. Walkers. Scooters. Crutches. Turn on the lights when you go into a dark area. Replace any light bulbs as soon as they burn out. Set up your furniture so you have a clear path. Avoid moving your furniture around. If any of your floors are uneven, fix them. If there are any pets around you, be aware of where they are. Review your medicines with your doctor. Some medicines can make you feel dizzy. This can increase your chance of falling. Ask your doctor what other things that you can do to help prevent falls. This information is not intended to replace advice given to you by your health care provider. Make sure you discuss any questions you have with your health care provider. Document Released: 08/26/2009 Document Revised: 04/06/2016 Document Reviewed: 12/04/2014 Elsevier Interactive Patient Education  2017 [employer].

## 2022-09-21 LAB — HM DIABETES EYE EXAM

## 2022-09-26 ENCOUNTER — Telehealth: Payer: Self-pay

## 2022-09-26 NOTE — Telephone Encounter (Signed)
FYI patient's complaint for tomorrows OV.

## 2022-09-26 NOTE — Telephone Encounter (Signed)
Patient called advising another provider prescribed him atorvastatin (LIPITOR) 40 MG tablet.   Patient advised he has noticed an increase in urination and is having urge incontinence. He wanted to know if that was a common symptom with this medication. He also wanted to know if there was another medication that could help him with symptoms.    Patient coming in tomorrow to see Sharee Pimple.

## 2022-09-27 ENCOUNTER — Ambulatory Visit: Payer: Medicare Other | Admitting: Physician Assistant

## 2022-09-27 ENCOUNTER — Telehealth: Payer: Self-pay | Admitting: Family Medicine

## 2022-09-27 NOTE — Telephone Encounter (Signed)
Pt calling to see if provider would change Atorvastatin to something else because Atorvastatin is making him urinate more than normal. Please advise. Thank you

## 2022-09-27 NOTE — Telephone Encounter (Signed)
Pt contacted and states he would like it changed. Began taking Lipitor on Monday and states last couple of days he has been having frequency. Pt has not taken it today and urine frequency has decreased. Please advise. Thank you

## 2022-09-28 MED ORDER — ROSUVASTATIN CALCIUM 20 MG PO TABS: ORAL_TABLET | ORAL | 1 refills | Status: DC

## 2022-09-28 NOTE — Addendum Note (Signed)
Addended by: Vicente Males on: 09/28/2022 11:54 AM   Modules accepted: Orders

## 2022-09-28 NOTE — Telephone Encounter (Signed)
Crestor 20 mg sent to pharmacy and pt is aware

## 2022-10-06 ENCOUNTER — Emergency Department (HOSPITAL_COMMUNITY)
Admission: EM | Admit: 2022-10-06 | Discharge: 2022-10-06 | Disposition: A | Discharge status: Home / Self Care | Payer: Medicare Other | Attending: Emergency Medicine | Admitting: Emergency Medicine

## 2022-10-06 ENCOUNTER — Emergency Department (HOSPITAL_COMMUNITY): Payer: Medicare Other

## 2022-10-06 ENCOUNTER — Other Ambulatory Visit: Payer: Self-pay

## 2022-10-06 ENCOUNTER — Encounter (HOSPITAL_COMMUNITY): Payer: Self-pay | Admitting: *Deleted

## 2022-10-06 DIAGNOSIS — M545 Low back pain, unspecified: Secondary | ICD-10-CM | POA: Diagnosis present

## 2022-10-06 DIAGNOSIS — N200 Calculus of kidney: Secondary | ICD-10-CM

## 2022-10-06 DIAGNOSIS — N132 Hydronephrosis with renal and ureteral calculous obstruction: Secondary | ICD-10-CM | POA: Diagnosis not present

## 2022-10-06 DIAGNOSIS — Z8546 Personal history of malignant neoplasm of prostate: Secondary | ICD-10-CM | POA: Insufficient documentation

## 2022-10-06 LAB — URINALYSIS, ROUTINE W REFLEX MICROSCOPIC
Bilirubin Urine: NEGATIVE
Glucose, UA: NEGATIVE mg/dL
Hgb urine dipstick: NEGATIVE
Ketones, ur: NEGATIVE mg/dL
Leukocytes,Ua: NEGATIVE
Nitrite: NEGATIVE
Protein, ur: NEGATIVE mg/dL
Specific Gravity, Urine: 1.005 (ref 1.005–1.030)
pH: 7 (ref 5.0–8.0)

## 2022-10-06 LAB — COMPREHENSIVE METABOLIC PANEL
ALT: 11 U/L (ref 0–44)
AST: 17 U/L (ref 15–41)
Albumin: 3.9 g/dL (ref 3.5–5.0)
Alkaline Phosphatase: 70 U/L (ref 38–126)
Anion gap: 7 (ref 5–15)
BUN: 6 mg/dL — ABNORMAL LOW (ref 8–23)
CO2: 25 mmol/L (ref 22–32)
Calcium: 9 mg/dL (ref 8.9–10.3)
Chloride: 110 mmol/L (ref 98–111)
Creatinine, Ser: 1.11 mg/dL (ref 0.61–1.24)
GFR, Estimated: >60 mL/min (ref >60)
Glucose, Bld: 97 mg/dL (ref 70–99)
Potassium: 3.7 mmol/L (ref 3.5–5.1)
Sodium: 142 mmol/L (ref 135–145)
Total Bilirubin: 0.7 mg/dL (ref 0.3–1.2)
Total Protein: 6.8 g/dL (ref 6.5–8.1)

## 2022-10-06 LAB — CBC WITH DIFFERENTIAL/PLATELET
Abs Immature Granulocytes: 0 10*3/uL (ref 0.00–0.07)
Basophils Absolute: 0 10*3/uL (ref 0.0–0.1)
Basophils Relative: 1 %
Eosinophils Absolute: 0.1 10*3/uL (ref 0.0–0.5)
Eosinophils Relative: 2 %
HCT: 35.5 % — ABNORMAL LOW (ref 39.0–52.0)
Hemoglobin: 11.4 g/dL — ABNORMAL LOW (ref 13.0–17.0)
Immature Granulocytes: 0 %
Lymphocytes Relative: 17 %
Lymphs Abs: 0.9 10*3/uL (ref 0.7–4.0)
MCH: 26.4 pg (ref 26.0–34.0)
MCHC: 32.1 g/dL (ref 30.0–36.0)
MCV: 82.2 fL (ref 80.0–100.0)
Monocytes Absolute: 0.5 10*3/uL (ref 0.1–1.0)
Monocytes Relative: 10 %
Neutro Abs: 3.9 10*3/uL (ref 1.7–7.7)
Neutrophils Relative %: 70 %
Platelets: 218 10*3/uL (ref 150–400)
RBC: 4.32 MIL/uL (ref 4.22–5.81)
RDW: 15.1 % (ref 11.5–15.5)
WBC: 5.5 10*3/uL (ref 4.0–10.5)
nRBC: 0 % (ref 0.0–0.2)

## 2022-10-06 MED ORDER — HYDROMORPHONE HCL 1 MG/ML IJ SOLN
1.0000 mg | Freq: Once | INTRAMUSCULAR | Status: DC
  Filled 2022-10-06: qty 1

## 2022-10-06 MED ORDER — OXYCODONE-ACETAMINOPHEN 5-325 MG PO TABS: 1.0000 | ORAL_TABLET | Freq: Four times a day (QID) | ORAL | 0 refills | Status: DC | PRN

## 2022-10-06 MED ORDER — KETOROLAC TROMETHAMINE 30 MG/ML IJ SOLN
30.0000 mg | Freq: Once | INTRAMUSCULAR | Status: AC
  Administered 2022-10-06: 30 mg via INTRAMUSCULAR
  Filled 2022-10-06: qty 1

## 2022-10-06 MED ORDER — ONDANSETRON HCL 4 MG/2ML IJ SOLN
4.0000 mg | Freq: Once | INTRAMUSCULAR | Status: DC
  Filled 2022-10-06: qty 2

## 2022-10-06 MED ORDER — ONDANSETRON 4 MG PO TBDP
4.0000 mg | ORAL_TABLET | Freq: Once | ORAL | Status: AC
  Administered 2022-10-06: 4 mg via ORAL
  Filled 2022-10-06: qty 1

## 2022-10-06 MED ORDER — TAMSULOSIN HCL 0.4 MG PO CAPS: 0.4000 mg | ORAL_CAPSULE | Freq: Every day | ORAL | 0 refills | Status: DC

## 2022-10-06 MED ORDER — ONDANSETRON 4 MG PO TBDP: ORAL_TABLET | ORAL | 0 refills | Status: DC

## 2022-10-06 MED ORDER — HYDROMORPHONE HCL 1 MG/ML IJ SOLN
1.0000 mg | Freq: Once | INTRAMUSCULAR | Status: AC
  Administered 2022-10-06: 1 mg via INTRAMUSCULAR
  Filled 2022-10-06: qty 1

## 2022-10-06 NOTE — Discharge Instructions (Signed)
Follow-up with your urologist this week. °

## 2022-10-06 NOTE — ED Provider Notes (Signed)
[facility] EMERGENCY DEPARTMENT Provider Note   CSN: [id number] Arrival date & time: 10/06/22  0814     History  Chief Complaint  Patient presents with   Back Pain    Tony Powell is a 71 y.o. male.  Patient complains of back pain.  He has a history of prostate cancer.  His pain started in left flank and is all across his lower back  The history is provided by the patient. No language interpreter was used.  Back Pain Location:  Lumbar spine Quality:  Aching Pain severity:  Moderate Pain is:  Same all the time Onset quality:  Sudden Timing:  Constant Progression:  Worsening Chronicity:  New Context: not emotional stress   Relieved by:  Nothing Worsened by:  Nothing Associated symptoms: no abdominal pain, no chest pain and no headaches        Home Medications Prior to Admission medications   Medication Sig Start Date End Date Taking? Authorizing Provider  ondansetron (ZOFRAN-ODT) 4 MG disintegrating tablet '4mg'$  ODT q4 hours prn nausea/vomit 10/06/22  Yes Milton Ferguson, MD  oxyCODONE-acetaminophen (PERCOCET/ROXICET) 5-325 MG tablet Take 1 tablet by mouth every 6 (six) hours as needed for severe pain. 10/06/22  Yes Milton Ferguson, MD  tamsulosin (FLOMAX) 0.4 MG CAPS capsule Take 1 capsule (0.4 mg total) by mouth daily. 10/06/22  Yes Milton Ferguson, MD  atorvastatin (LIPITOR) 40 MG tablet Take 1 tablet (40 mg total) by mouth daily. 08/25/22   Coral Spikes, DO  cyanocobalamin 1000 MCG tablet Take 1,000 mcg by mouth daily.    [provider]  finasteride (PROSCAR) 5 MG tablet Take 1 tablet (5 mg total) by mouth daily. 05/12/22   McKenzie, Candee Furbish, MD  Iron, Ferrous Sulfate, 325 (65 Fe) MG TABS Take 325 mg by mouth every other day. 08/20/22   Coral Spikes, DO  rosuvastatin (CRESTOR) 20 MG tablet Take one tablet po mouth daily 09/28/22   Coral Spikes, DO  sildenafil (VIAGRA) 100 MG tablet Take 1 tablet (100 mg total) by mouth daily as needed for erectile  dysfunction. 08/25/22   Coral Spikes, DO  valsartan (DIOVAN) 160 MG tablet Take 1 tablet (160 mg total) by mouth daily. 08/17/22   Coral Spikes, DO      Allergies    Celebrex [celecoxib]    Review of Systems   Review of Systems  Constitutional:  Negative for appetite change and fatigue.  HENT:  Negative for congestion, ear discharge and sinus pressure.   Eyes:  Negative for discharge.  Respiratory:  Negative for cough.   Cardiovascular:  Negative for chest pain.  Gastrointestinal:  Negative for abdominal pain and diarrhea.  Genitourinary:  Negative for frequency and hematuria.  Musculoskeletal:  Positive for back pain.  Skin:  Negative for rash.  Neurological:  Negative for seizures and headaches.  Psychiatric/Behavioral:  Negative for hallucinations.     Physical Exam Updated Vital Signs BP (!) 161/98   Pulse 62   Temp 98.5 F (36.9 C) (Oral)   Resp 20   Ht '5\' 10"'$  (1.778 m)   Wt 115.7 kg   SpO2 100%   BMI 36.59 kg/m  Physical Exam Vitals and nursing note reviewed.  Constitutional:      Appearance: He is well-developed.  HENT:     Head: Normocephalic.     Nose: Nose normal.  Eyes:     General: No scleral icterus.    Conjunctiva/sclera: Conjunctivae normal.  Neck:  Thyroid: No thyromegaly.  Cardiovascular:     Rate and Rhythm: Normal rate and regular rhythm.     Heart sounds: No murmur heard.    No friction rub. No gallop.  Pulmonary:     Breath sounds: No stridor. No wheezing or rales.  Chest:     Chest wall: No tenderness.  Abdominal:     General: There is no distension.     Tenderness: There is no abdominal tenderness. There is no rebound.  Genitourinary:    Comments: Tender left flank and lumbar area Musculoskeletal:        General: Normal range of motion.     Cervical back: Neck supple.  Lymphadenopathy:     Cervical: No cervical adenopathy.  Skin:    Findings: No erythema or rash.  Neurological:     Mental Status: He is alert and oriented to  person, place, and time.     Motor: No abnormal muscle tone.     Coordination: Coordination normal.  Psychiatric:        Behavior: Behavior normal.     ED Results / Procedures / Treatments   Labs (all labs ordered are listed, but only abnormal results are displayed) Labs Reviewed  CBC WITH DIFFERENTIAL/PLATELET - Abnormal; Notable for the following components:      Result Value   Hemoglobin 11.4 (*)    HCT 35.5 (*)    All other components within normal limits  COMPREHENSIVE METABOLIC PANEL - Abnormal; Notable for the following components:   BUN 6 (*)    All other components within normal limits  URINALYSIS, ROUTINE W REFLEX MICROSCOPIC - Abnormal; Notable for the following components:   Color, Urine STRAW (*)    All other components within normal limits    EKG None  Radiology CT Renal Stone Study  Result Date: 10/06/2022 CLINICAL DATA:  Flank pain; * Tracking Code: BO * EXAM: CT ABDOMEN AND PELVIS WITHOUT CONTRAST TECHNIQUE: Multidetector CT imaging of the abdomen and pelvis was performed following the standard protocol without IV contrast. RADIATION DOSE REDUCTION: This exam was performed according to the departmental dose-optimization program which includes automated exposure control, adjustment of the mA and/or kV according to patient size and/or use of iterative reconstruction technique. COMPARISON:  CT abdomen and pelvis dated September 22, 2020 FINDINGS: Lower chest: No acute abnormality. Hepatobiliary: No focal liver abnormality is seen. No gallstones, gallbladder wall thickening, or biliary dilatation. Pancreas: Unremarkable. No pancreatic ductal dilatation or surrounding inflammatory changes. Spleen: Normal in size without focal abnormality. Adrenals/Urinary Tract: Mild left hydronephrosis with a 2 mm stone seen in the distal left ureter on series 2, image 73 additional nonobstructing left renal stones are seen. Decreased circumferential masslike thickening of the bladder wall  with residual mild wall thickening of the inferior bladder seen on series 2, image 79. Stomach/Bowel: Stomach is within normal limits. Appendix appears normal. No evidence of bowel wall thickening, distention, or inflammatory changes. Vascular/Lymphatic: Aortic atherosclerosis. New enlarged right common iliac artery lymph node measuring 1.7 cm in short axis series 2, image 53. Pelvic lymph nodes which enlarged on prior exam have decreased in size. Reference left external iliac lymph node measuring 6 mm in short axis on series 2, image 70, previously 1.8 cm in short axis. Reproductive: Prostate is unremarkable. Other: No abdominal wall hernia or abnormality. No abdominopelvic ascites. Musculoskeletal: Lipoma of the left gluteus musculature. New lytic lesion of the posterior body of T12. IMPRESSION: 1. Mild left hydronephrosis with a 2 mm stone seen  in the distal left ureter. 2. New enlarged right common iliac artery lymph node and new lytic lesion of the posterior body of T12, consistent with progressive metastatic disease. 3. Decreased circumferential masslike thickening of the bladder wall with residual mild wall thickening of the inferior bladder, compatible with history of bladder malignancy. 4. Pelvic lymph nodes which enlarged on prior September 22, 2020 exam have decreased in size. 5. Aortic Atherosclerosis (ICD10-I70.0). Electronically Signed   By: Yetta Glassman M.D.   On: 10/06/2022 12:55    Procedures Procedures    Medications Ordered in ED Medications  HYDROmorphone (DILAUDID) injection 1 mg (1 mg Intramuscular Given 10/06/22 1327)  ondansetron (ZOFRAN-ODT) disintegrating tablet 4 mg (4 mg Oral Given 10/06/22 1328)  ketorolac (TORADOL) 30 MG/ML injection 30 mg (30 mg Intramuscular Given 10/06/22 1327)    ED Course/ Medical Decision Making/ A&P                           Medical Decision Making Amount and/or Complexity of Data Reviewed Labs: ordered. Radiology:  ordered.  Risk Prescription drug management.  This patient presents to the ED for concern of back pain, this involves an extensive number of treatment options, and is a complaint that carries with it a high risk of complications and morbidity.  The differential diagnosis includes possible skeletal pain, kidney stone   Co morbidities that complicate the patient evaluation  Prostate cancer   Additional history obtained:  Additional history obtained from patient External records from outside source obtained and reviewed including hospital records   Lab Tests:  I Ordered, and personally interpreted labs.  The pertinent results include: Below 11.4, urinalysis normal   Imaging Studies ordered:  I ordered imaging studies including CT abdomen I independently visualized and interpreted imaging which showed kidney stone in right ureter and lytic lesion at T12 I agree with the radiologist interpretation   Cardiac Monitoring: / EKG:  The patient was maintained on a cardiac monitor.  I personally viewed and interpreted the cardiac monitored which showed an underlying rhythm of: Normal sinus rhythm   Consultations Obtained:  No consultant  Problem List / ED Course / Critical interventions / Medication management  Prostate cancer and kidney stone I ordered medication including Dilaudid for pain Reevaluation of the patient after these medicines showed that the patient improved I have reviewed the patients home medicines and have made adjustments as needed   Social Determinants of Health:  None   Test / Admission - Considered:  None  Patient with a kidney stone and a lytic lesion to T12.  He is given pain medicine nausea medicine and Flomax and will follow-up with his urologist        Final Clinical Impression(s) / ED Diagnoses Final diagnoses:  Kidney stone    Rx / DC Orders ED Discharge Orders          Ordered    oxyCODONE-acetaminophen (PERCOCET/ROXICET)  5-325 MG tablet  Every 6 hours PRN        10/06/22 1351    ondansetron (ZOFRAN-ODT) 4 MG disintegrating tablet        10/06/22 1351    tamsulosin (FLOMAX) 0.4 MG CAPS capsule  Daily        10/06/22 1351              Milton Ferguson, MD 10/07/22 1022

## 2022-10-06 NOTE — ED Triage Notes (Signed)
Pt c/o lower back pain x 4 days; pt denies any obvious injury

## 2022-10-09 ENCOUNTER — Telehealth: Payer: Self-pay

## 2022-10-09 NOTE — Telephone Encounter (Signed)
Patient called to schedule an ER follow up from 11/24. Next available is 12/15. Patient was seen in ED for kidney stones and advised that the provider found something that concerned him in patients prostate. Patient advised she was concerned and wanted to ensure his cancer was not spreading. Patient wanted to know if you could review the ED notes and findings and contact him with any concerns.    Thank you

## 2022-10-27 ENCOUNTER — Ambulatory Visit (INDEPENDENT_AMBULATORY_CARE_PROVIDER_SITE_OTHER): Payer: Medicare Other | Admitting: Urology

## 2022-10-27 ENCOUNTER — Encounter: Payer: Self-pay | Admitting: Urology

## 2022-10-27 VITALS — BP 171/90 | HR 96 | Ht 70.0 in | Wt 249.8 lb

## 2022-10-27 DIAGNOSIS — C61 Malignant neoplasm of prostate: Secondary | ICD-10-CM

## 2022-10-27 DIAGNOSIS — N200 Calculus of kidney: Secondary | ICD-10-CM | POA: Diagnosis not present

## 2022-10-27 LAB — URINALYSIS, ROUTINE W REFLEX MICROSCOPIC
Bilirubin, UA: NEGATIVE
Glucose, UA: NEGATIVE
Ketones, UA: NEGATIVE
Nitrite, UA: NEGATIVE
Protein,UA: NEGATIVE
Specific Gravity, UA: 1.015 (ref 1.005–1.030)
Urobilinogen, Ur: 0.2 mg/dL (ref 0.2–1.0)
pH, UA: 7 (ref 5.0–7.5)

## 2022-10-27 LAB — MICROSCOPIC EXAMINATION: Bacteria, UA: NONE SEEN

## 2022-10-27 MED ORDER — OXYCODONE-ACETAMINOPHEN 5-325 MG PO TABS: 1.0000 | ORAL_TABLET | Freq: Four times a day (QID) | ORAL | 0 refills | Status: DC | PRN

## 2022-10-27 MED ORDER — TAMSULOSIN HCL 0.4 MG PO CAPS: 0.4000 mg | ORAL_CAPSULE | Freq: Every day | ORAL | 2 refills | Status: DC

## 2022-10-27 NOTE — Patient Instructions (Signed)

## 2022-10-27 NOTE — Progress Notes (Unsigned)
10/27/2022 11:03 AM   Tony Powell November 16, 1950 [id number]  Referring provider: Coral Spikes, DO [place],  [place] [id number]  Nephrolithiasis and prostate cancer   HPI: [name] is a 71yo here for followup for prostate cancer and evaluation of nephrolithiasis. He was seen in the ER in 11/24 with left flank pain and underwent CT which showed a 58m left distal ureteral calculus. He does not know if he passed the calculus. He continues to have intermittent left flank pain. CT scan showed a new iliac lymph nodes and a t12 lesion concerning for metastatic prostate cancer.    PMH: Past Medical History:  Diagnosis Date   Abnormal stress test    Borderline hypertension    Chest discomfort    Atypical   DJD (degenerative joint disease)    Knees, lumbar sacral spine   Hyperlipidemia    Hypertension    Prostate cancer ([facility]     Surgical History: Past Surgical History:  Procedure Laterality Date   BACK SURGERY     CYSTOSCOPY WITH FULGERATION  04/18/2021   Procedure: CYSTOSCOPY WITH FULGERATION of BLADDER NECK;  Surgeon: MCleon Gustin MD;  Location: [facility];  Service: Urology;;   CYSTOSCOPY WITH INJECTION N/A 04/18/2021   Procedure: CYSTOSCOPY WITH INJECTION- Botox 100 units;  Surgeon: MCleon Gustin MD;  Location: [facility];  Service: Urology;  Laterality: N/A;   TRANSURETHRAL RESECTION OF BLADDER TUMOR N/A 08/25/2020   Procedure: TRANSURETHRAL RESECTION OF BLADDER TUMOR (TURBT);  Surgeon: MCleon Gustin MD;  Location: [facility];  Service: Urology;  Laterality: N/A;    Home Medications:  Allergies as of 10/27/2022       Reactions   Celebrex [celecoxib]    Eyes drooping and messed up skin on hands        Medication List        Accurate as of October 27, 2022 11:03 AM. If you have any questions, ask your nurse or doctor.          atorvastatin 40 MG tablet Commonly known as: LIPITOR Take 1 tablet (40 mg total) by mouth daily.    cyanocobalamin 1000 MCG tablet Take 1,000 mcg by mouth daily.   finasteride 5 MG tablet Commonly known as: PROSCAR Take 1 tablet (5 mg total) by mouth daily.   Iron (Ferrous Sulfate) 325 (65 Fe) MG Tabs Take 325 mg by mouth every other day.   ondansetron 4 MG disintegrating tablet Commonly known as: ZOFRAN-ODT '4mg'$  ODT q4 hours prn nausea/vomit   oxyCODONE-acetaminophen 5-325 MG tablet Commonly known as: PERCOCET/ROXICET Take 1 tablet by mouth every 6 (six) hours as needed for severe pain.   rosuvastatin 20 MG tablet Commonly known as: Crestor Take one tablet po mouth daily   sildenafil 100 MG tablet Commonly known as: VIAGRA Take 1 tablet (100 mg total) by mouth daily as needed for erectile dysfunction.   tamsulosin 0.4 MG Caps capsule Commonly known as: Flomax Take 1 capsule (0.4 mg total) by mouth daily.   valsartan 160 MG tablet Commonly known as: DIOVAN Take 1 tablet (160 mg total) by mouth daily.        Allergies:  Allergies  Allergen Reactions   Celebrex [Celecoxib]     Eyes drooping and messed up skin on hands    Family History: Family History  Problem Relation Age of Onset   Heart attack Mother    Breast cancer Mother        Mastectomy  Leukemia Father    Cancer Brother    Cancer Brother    Cancer Brother    Cancer Brother    Cancer Brother    Colon cancer Neg Hx    Prostate cancer Neg Hx    Pancreatic cancer Neg Hx     Social History:  reports that he quit smoking about 23 years ago. His smoking use included cigarettes. He has a 20.00 pack-year smoking history. He has never used smokeless tobacco. He reports that he does not drink alcohol and does not use drugs.  ROS: All other review of systems were reviewed and are negative except what is noted above in HPI  Physical Exam: BP (!) 171/90   Pulse 96   Ht '5\' 10"'$  (1.778 m)   Wt 249 lb 12.8 oz (113.3 kg)   BMI 35.84 kg/m   Constitutional:  Alert and oriented, No acute  distress. HEENT: [place] AT, moist mucus membranes.  Trachea midline, no masses. Cardiovascular: No clubbing, cyanosis, or edema. Respiratory: Normal respiratory effort, no increased work of breathing. GI: Abdomen is soft, nontender, nondistended, no abdominal masses GU: No CVA tenderness.  Lymph: No cervical or inguinal lymphadenopathy. Skin: No rashes, bruises or suspicious lesions. Neurologic: Grossly intact, no focal deficits, moving all 4 extremities. Psychiatric: Normal mood and affect.  Laboratory Data: Lab Results  Component Value Date   WBC 5.5 10/06/2022   HGB 11.4 (L) 10/06/2022   HCT 35.5 (L) 10/06/2022   MCV 82.2 10/06/2022   PLT 218 10/06/2022    Lab Results  Component Value Date   CREATININE 1.11 10/06/2022    No results found for: "PSA"  No results found for: "TESTOSTERONE"  No results found for: "HGBA1C"  Urinalysis    Component Value Date/Time   COLORURINE STRAW (A) 10/06/2022 [place] 10/06/2022 1144   APPEARANCEUR Clear 11/09/2021 1129   LABSPEC 1.005 10/06/2022 1144   PHURINE 7.0 10/06/2022 1144   GLUCOSEU NEGATIVE 10/06/2022 1144   HGBUR NEGATIVE 10/06/2022 1144   BILIRUBINUR NEGATIVE 10/06/2022 1144   BILIRUBINUR Negative 11/09/2021 [place] 10/06/2022 1144   PROTEINUR NEGATIVE 10/06/2022 1144   NITRITE NEGATIVE 10/06/2022 1144   LEUKOCYTESUR NEGATIVE 10/06/2022 1144    Lab Results  Component Value Date   LABMICR See below: 11/09/2021   WBCUA None seen 11/09/2021   LABEPIT >10 (A) 11/09/2021   MUCUS Present 11/09/2021   BACTERIA Few 11/09/2021    Pertinent Imaging: CT 10/06/2022: Images reviewed and discussed with the patient  No results found for this or any previous visit.  No results found for this or any previous visit.  No results found for this or any previous visit.  No results found for this or any previous visit.  No results found for this or any previous visit.  No valid procedures  specified. No results found for this or any previous visit.  Results for orders placed during the hospital encounter of 10/06/22  CT Renal Stone Study  Narrative CLINICAL DATA:  Flank pain; * Tracking Code: BO *  EXAM: CT ABDOMEN AND PELVIS WITHOUT CONTRAST  TECHNIQUE: Multidetector CT imaging of the abdomen and pelvis was performed following the standard protocol without IV contrast.  RADIATION DOSE REDUCTION: This exam was performed according to the departmental dose-optimization program which includes automated exposure control, adjustment of the mA and/or kV according to patient size and/or use of iterative reconstruction technique.  COMPARISON:  CT abdomen and pelvis dated September 22, 2020  FINDINGS:  Lower chest: No acute abnormality.  Hepatobiliary: No focal liver abnormality is seen. No gallstones, gallbladder wall thickening, or biliary dilatation.  Pancreas: Unremarkable. No pancreatic ductal dilatation or surrounding inflammatory changes.  Spleen: Normal in size without focal abnormality.  Adrenals/Urinary Tract: Mild left hydronephrosis with a 2 mm stone seen in the distal left ureter on series 2, image 73 additional nonobstructing left renal stones are seen. Decreased circumferential masslike thickening of the bladder wall with residual mild wall thickening of the inferior bladder seen on series 2, image 79.  Stomach/Bowel: Stomach is within normal limits. Appendix appears normal. No evidence of bowel wall thickening, distention, or inflammatory changes.  Vascular/Lymphatic: Aortic atherosclerosis. New enlarged right common iliac artery lymph node measuring 1.7 cm in short axis series 2, image 53. Pelvic lymph nodes which enlarged on prior exam have decreased in size. Reference left external iliac lymph node measuring 6 mm in short axis on series 2, image 70, previously 1.8 cm in short axis.  Reproductive: Prostate is unremarkable.  Other: No  abdominal wall hernia or abnormality. No abdominopelvic ascites.  Musculoskeletal: Lipoma of the left gluteus musculature. New lytic lesion of the posterior body of T12.  IMPRESSION: 1. Mild left hydronephrosis with a 2 mm stone seen in the distal left ureter. 2. New enlarged right common iliac artery lymph node and new lytic lesion of the posterior body of T12, consistent with progressive metastatic disease. 3. Decreased circumferential masslike thickening of the bladder wall with residual mild wall thickening of the inferior bladder, compatible with history of bladder malignancy. 4. Pelvic lymph nodes which enlarged on prior September 22, 2020 exam have decreased in size. 5. Aortic Atherosclerosis (ICD10-I70.0).   Electronically Signed By: Yetta Glassman M.D. On: 10/06/2022 12:55   Assessment & Plan:    1. Kidney stones --We discussed the management of kidney stones. These options include observation, ureteroscopy, shockwave lithotripsy (ESWL) and percutaneous nephrolithotomy (PCNL). We discussed which options are relevant to the patient's stone(s). We discussed the natural history of kidney stones as well as the complications of untreated stones and the impact on quality of life without treatment as well as with each of the above listed treatments. We also discussed the efficacy of each treatment in its ability to clear the stone burden. With any of these management options I discussed the signs and symptoms of infection and the need for emergent treatment should these be experienced. For each option we discussed the ability of each procedure to clear the patient of their stone burden.   For observation I described the risks which include but are not limited to silent renal damage, life-threatening infection, need for emergent surgery, failure to pass stone and pain.   For ureteroscopy I described the risks which include bleeding, infection, damage to contiguous structures,  positioning injury, ureteral stricture, ureteral avulsion, ureteral injury, need for prolonged ureteral stent, inability to perform ureteroscopy, need for an interval procedure, inability to clear stone burden, stent discomfort/pain, heart attack, stroke, pulmonary embolus and the inherent risks with general anesthesia.   For shockwave lithotripsy I described the risks which include arrhythmia, kidney contusion, kidney hemorrhage, need for transfusion, pain, inability to adequately break up stone, inability to pass stone fragments, Steinstrasse, infection associated with obstructing stones, need for alternate surgical procedure, need for repeat shockwave lithotripsy, MI, CVA, PE and the inherent risks with anesthesia/conscious sedation.   For PCNL I described the risks including positioning injury, pneumothorax, hydrothorax, need for chest tube, inability to clear stone burden,  renal laceration, arterial venous fistula or malformation, need for embolization of kidney, loss of kidney or renal function, need for repeat procedure, need for prolonged nephrostomy tube, ureteral avulsion, MI, CVA, PE and the inherent risks of general anesthesia.   - The patient would like to proceed with medical expulsive therapy.  - Urinalysis, Routine w reflex microscopic  2. Prostate cancer ([place]) -PSMA PET  3. Nephrolithiasis --We discussed the management of kidney stones. These options include observation, ureteroscopy, shockwave lithotripsy (ESWL) and percutaneous nephrolithotomy (PCNL). We discussed which options are relevant to the patient's stone(s). We discussed the natural history of kidney stones as well as the complications of untreated stones and the impact on quality of life without treatment as well as with each of the above listed treatments. We also discussed the efficacy of each treatment in its ability to clear the stone burden. With any of these management options I discussed the signs and symptoms of  infection and the need for emergent treatment should these be experienced. For each option we discussed the ability of each procedure to clear the patient of their stone burden.   For observation I described the risks which include but are not limited to silent renal damage, life-threatening infection, need for emergent surgery, failure to pass stone and pain.   For ureteroscopy I described the risks which include bleeding, infection, damage to contiguous structures, positioning injury, ureteral stricture, ureteral avulsion, ureteral injury, need for prolonged ureteral stent, inability to perform ureteroscopy, need for an interval procedure, inability to clear stone burden, stent discomfort/pain, heart attack, stroke, pulmonary embolus and the inherent risks with general anesthesia.   For shockwave lithotripsy I described the risks which include arrhythmia, kidney contusion, kidney hemorrhage, need for transfusion, pain, inability to adequately break up stone, inability to pass stone fragments, Steinstrasse, infection associated with obstructing stones, need for alternate surgical procedure, need for repeat shockwave lithotripsy, MI, CVA, PE and the inherent risks with anesthesia/conscious sedation.   For PCNL I described the risks including positioning injury, pneumothorax, hydrothorax, need for chest tube, inability to clear stone burden, renal laceration, arterial venous fistula or malformation, need for embolization of kidney, loss of kidney or renal function, need for repeat procedure, need for prolonged nephrostomy tube, ureteral avulsion, MI, CVA, PE and the inherent risks of general anesthesia.   - The patient would like to proceed with medical expulsive therapy   No follow-ups on file.  [name], MD  [facility] [place]

## 2022-10-30 ENCOUNTER — Encounter: Payer: Self-pay | Admitting: *Deleted

## 2022-10-31 ENCOUNTER — Encounter (INDEPENDENT_AMBULATORY_CARE_PROVIDER_SITE_OTHER): Payer: Self-pay | Admitting: Gastroenterology

## 2022-10-31 ENCOUNTER — Encounter (INDEPENDENT_AMBULATORY_CARE_PROVIDER_SITE_OTHER): Payer: Self-pay | Admitting: *Deleted

## 2022-10-31 ENCOUNTER — Ambulatory Visit (INDEPENDENT_AMBULATORY_CARE_PROVIDER_SITE_OTHER): Payer: Medicare Other | Admitting: Gastroenterology

## 2022-10-31 VITALS — BP 189/83 | HR 90 | Temp 97.5°F | Ht 70.0 in | Wt 246.7 lb

## 2022-10-31 DIAGNOSIS — R634 Abnormal weight loss: Secondary | ICD-10-CM | POA: Insufficient documentation

## 2022-10-31 DIAGNOSIS — D509 Iron deficiency anemia, unspecified: Secondary | ICD-10-CM | POA: Diagnosis not present

## 2022-10-31 MED ORDER — PEG 3350-KCL-NA BICARB-NACL 420 G PO SOLR: 4000.0000 mL | Freq: Once | ORAL | 0 refills | Status: AC

## 2022-10-31 NOTE — Patient Instructions (Signed)
We will get you scheduled for EGD and colonoscopy as discussed for further evaluation of your anemia Your blood pressure was elevated in office today, please touch base with your PCP regarding further management of this  Follow up 3 months

## 2022-10-31 NOTE — Addendum Note (Signed)
Addended by: Cheron Every on: 10/31/2022 11:08 AM   Modules accepted: Orders

## 2022-10-31 NOTE — Progress Notes (Addendum)
Referring Provider: Coral Spikes, DO Primary Care Physician:  Coral Spikes, DO Primary GI Physician: new   Chief Complaint  Patient presents with   Anemia    Patient being seen today due to anemia. Patient denies any sight of bright red blood or dark stools. He has some fatigue and less energy since he had radiation. Denies any shortness of breath.Hgb 11.4 on November 24,2023.   HPI:   Tony Powell is a 71 y.o. male with past medical history of HTN, DJD, HLD, prostate cancer, bladder tumor, COPD  Patient presenting today for anemia.   Last hgb was 11.4 In november, notably hgb around 9-11 range over the past 2 years. Iron studies in October with TIBC 436, Iron 64, iron sat 15, ferritin 35, folate and B12 WNL. CMP in November WNL as well.   History of prostate cancer, CT renal stone study in November with New enlarged right common iliac artery lymph node and new lytic lesion of the posterior body of T12, consistent with progressive metastatic disease.  Patient states that he is unsure why he is here, thought that he was coming to have a colonoscopy. Notably reports requiring a blood transfusion in April 2022 when he visited the ED, hgb was 8 at that time. He is unsure why he had low blood counts. Denies rectal bleeding or melena. Denies abdominal pain. No constipation or diarrhea.  Denies SOB or dizziness. He does endorse fatigue though this has been going on since he had radiation for his prostate cancer. Denies nausea or vomiting. Denies early satiety but appetite has decreased quite a bit over the past year and maybe lost about 10 pounds during that time as well. No presence of hematuria per patient.   NSAID use: aleve, usually 2-3 per night due to R flank pain, ongoing for years  Social hx: no otoh or tobacco  Fam hx: no CRC or liver disease  Last Colonoscopy: never Last Endoscopy: never  Recommendations:    Past Medical History:  Diagnosis Date   Abnormal stress test     Borderline hypertension    Chest discomfort    Atypical   DJD (degenerative joint disease)    Knees, lumbar sacral spine   Hyperlipidemia    Hypertension    Prostate cancer [facility])     Past Surgical History:  Procedure Laterality Date   BACK SURGERY     CYSTOSCOPY WITH FULGERATION  04/18/2021   Procedure: CYSTOSCOPY WITH FULGERATION of BLADDER NECK;  Surgeon: Cleon Gustin, MD;  Location: [facility];  Service: Urology;;   CYSTOSCOPY WITH INJECTION N/A 04/18/2021   Procedure: CYSTOSCOPY WITH INJECTION- Botox 100 units;  Surgeon: Cleon Gustin, MD;  Location: [facility];  Service: Urology;  Laterality: N/A;   TRANSURETHRAL RESECTION OF BLADDER TUMOR N/A 08/25/2020   Procedure: TRANSURETHRAL RESECTION OF BLADDER TUMOR (TURBT);  Surgeon: Cleon Gustin, MD;  Location: [facility];  Service: Urology;  Laterality: N/A;    Current Outpatient Medications  Medication Sig Dispense Refill   alfuzosin (UROXATRAL) 10 MG 24 hr tablet Take 10 mg by mouth daily with breakfast.     cephALEXin (KEFLEX) 500 MG capsule Take 500 mg by mouth as needed.     cyanocobalamin 1000 MCG tablet Take 1,000 mcg by mouth daily.     finasteride (PROSCAR) 5 MG tablet Take 1 tablet (5 mg total) by mouth daily. 90 tablet 3   megestrol (MEGACE) 20 MG tablet Take 20 mg by mouth 3 (  three) times daily. prn     Multiple Vitamin (MULTIVITAMIN) capsule Take 1 capsule by mouth daily.     oxybutynin (DITROPAN XL) 15 MG 24 hr tablet Take 15 mg by mouth at bedtime.     oxyCODONE-acetaminophen (PERCOCET/ROXICET) 5-325 MG tablet Take 1 tablet by mouth every 6 (six) hours as needed for severe pain. 30 tablet 0   rosuvastatin (CRESTOR) 20 MG tablet Take one tablet po mouth daily 90 tablet 1   sildenafil (VIAGRA) 100 MG tablet Take 1 tablet (100 mg total) by mouth daily as needed for erectile dysfunction. 30 tablet 5   tamsulosin (FLOMAX) 0.4 MG CAPS capsule Take 1 capsule (0.4 mg total) by mouth daily. 30 capsule 2   valsartan (DIOVAN)  160 MG tablet Take 1 tablet (160 mg total) by mouth daily. 90 tablet 3   Iron, Ferrous Sulfate, 325 (65 Fe) MG TABS Take 325 mg by mouth every other day. 45 tablet 1   No current facility-administered medications for this visit.    Allergies as of 10/31/2022 - Review Complete 10/31/2022  Allergen Reaction Noted   Celebrex [celecoxib]  10/03/2017    Family History  Problem Relation Age of Onset   Heart attack Mother    Breast cancer Mother        Mastectomy   Leukemia Father    Cancer Brother    Cancer Brother    Cancer Brother    Cancer Brother    Cancer Brother    Colon cancer Neg Hx    Prostate cancer Neg Hx    Pancreatic cancer Neg Hx     Social History   Socioeconomic History   Marital status: Divorced    Spouse name: Not on file   Number of children: 2   Years of education: Not on file   Highest education level: Not on file  Occupational History   Occupation: Long Production manager: [employer]  Tobacco Use   Smoking status: Former    Packs/day: 1.00    Years: 20.00    Total pack years: 20.00    Types: Cigarettes    Quit date: 11/13/1998    Years since quitting: 23.9   Smokeless tobacco: Never  Vaping Use   Vaping Use: Never used  Substance and Sexual Activity   Alcohol use: No   Drug use: Never   Sexual activity: Not Currently  Other Topics Concern   Not on file  Social History Narrative   Divorced   Social Determinants of Health   Financial Resource Strain: Low Risk  (09/14/2022)   Overall Financial Resource Strain (CARDIA)    Difficulty of Paying Living Expenses: Not hard at all  Food Insecurity: No Food Insecurity (09/14/2022)   Hunger Vital Sign    Worried About Running Out of Food in the Last Year: Never true    Ran Out of Food in the Last Year: Never true  Transportation Needs: Not on file  Physical Activity: Inactive (09/14/2022)   Exercise Vital Sign    Days of Exercise per Week: 0 days    Minutes of Exercise per  Session: 0 min  Stress: No Stress Concern Present (09/14/2022)   [employer] [place]    Feeling of Stress : Only a little  Social Connections: Socially Isolated (09/14/2022)   Social Connection and Isolation Panel [NHANES]    Frequency of Communication with Friends and Family: Twice a week    Frequency of Social  Gatherings with Friends and Family: Never    Attends Religious Services: 1 to 4 times per year    Active Member of [employer] or Organizations: No    Attends Music therapist: Never    Marital Status: Divorced   Review of systems General: negative for night sweats, fever, chills, +weight loss +fatigue Neck: Negative for lumps, goiter, pain and significant neck swelling Resp: Negative for cough, wheezing, dyspnea at rest CV: Negative for chest pain, leg swelling, palpitations, orthopnea GI: denies melena, hematochezia, nausea, vomiting, diarrhea, constipation, dysphagia, odyonophagia, early satiety +weight loss +decreased appetite  MSK: Negative for joint pain or swelling, back pain, and muscle pain. Derm: Negative for itching or rash Psych: Denies depression, anxiety, memory loss, confusion. No homicidal or suicidal ideation.  Heme: Negative for prolonged bleeding, bruising easily, and swollen nodes. Endocrine: Negative for cold or heat intolerance, polyuria, polydipsia and goiter. Neuro: negative for tremor, gait imbalance, syncope and seizures. The remainder of the review of systems is noncontributory.  Physical Exam: BP (!) 189/83 (BP Location: Left Arm, Patient Position: Sitting, Cuff Size: Large)   Pulse 90   Temp (!) 97.5 F (36.4 C) (Temporal)   Ht '5\' 10"'$  (1.778 m)   Wt 246 lb 11.2 oz (111.9 kg)   BMI 35.40 kg/m  General:   Alert and oriented. No distress noted. Pleasant and cooperative.  Head:  Normocephalic and atraumatic. Eyes:  Conjuctiva clear without scleral icterus. Mouth:  Oral mucosa pink and  moist. Good dentition. No lesions. Heart: Normal rate and rhythm, s1 and s2 heart sounds present.  Lungs: Clear lung sounds in all lobes. Respirations equal and unlabored. Abdomen:  +BS, soft, non-tender and non-distended. No rebound or guarding. No HSM or masses noted. Derm: No palmar erythema or jaundice Msk:  Symmetrical without gross deformities. Normal posture. Extremities:  Without edema. Neurologic:  Alert and  oriented x4 Psych:  Alert and cooperative. Normal mood and affect.  Invalid input(s): "6 MONTHS"   ASSESSMENT: Tony Powell is a 71 y.o. male presenting today as a new patient for anemia.   Patient with anemia over the past 2 years, notably requiring blood transfusion in April 2022 with hgb 8 at that time. No previous endoscopic evaluations. He denies rectal bleeding or melena. No changes in bowel habits. Approx 10 pounds weight loss, decreased appetite and fatigue since radiation for prostate cancer in 2021. He does note taking aleve daily for the past few years. No UGI symptoms. Iron studies were low normal, could be dealing with progression towards iron deficiency given ongoing anemia. Recommend proceeding with EGD and Colonoscopy for further evaluation as we cannot rule out PUD, gastritis, duodenitis, colon polyps, AVMs, or malignancy. Indications, risks and benefits of procedure discussed in detail with patient. Patient verbalized understanding and is in agreement to proceed with EGD/Colonoscopy at this time.   The patient was found to have elevated blood pressure when vital signs were checked in the office. The blood pressure was rechecked by the nursing staff and it was found be persistently elevated >140/90 mmHg. I personally advised to the patient to follow up closely with PCP for hypertension control.   PLAN:  Schedule colonoscopy + EGD, ASA III ENDO 3 2. Follow up with PCP regarding HTN   All questions were answered, patient verbalized understanding and is in  agreement with plan as outlined above.    Follow Up: 3 months   Gianmarco Roye L. Alver Sorrow, MSN, APRN, AGNP-C Adult-Gerontology Nurse Practitioner [facility] for GI  Dise[facility]  I have reviewed the note and agree with the APP's assessment as described in this progress note  Maylon Peppers, MD Gastroenterology and Hepatology [facility]

## 2022-11-03 ENCOUNTER — Ambulatory Visit: Payer: Medicare Other | Admitting: Urology

## 2022-11-15 ENCOUNTER — Ambulatory Visit (HOSPITAL_COMMUNITY)
Admission: RE | Admit: 2022-11-15 | Discharge: 2022-11-15 | Disposition: A | Discharge status: Home / Self Care | Payer: Medicare Other | Source: Ambulatory Visit | Attending: Urology | Admitting: Urology

## 2022-11-15 ENCOUNTER — Ambulatory Visit (INDEPENDENT_AMBULATORY_CARE_PROVIDER_SITE_OTHER): Payer: Medicare Other | Admitting: Urology

## 2022-11-15 VITALS — BP 146/82 | HR 86

## 2022-11-15 DIAGNOSIS — R300 Dysuria: Secondary | ICD-10-CM | POA: Diagnosis not present

## 2022-11-15 DIAGNOSIS — C61 Malignant neoplasm of prostate: Secondary | ICD-10-CM | POA: Diagnosis not present

## 2022-11-15 DIAGNOSIS — N200 Calculus of kidney: Secondary | ICD-10-CM

## 2022-11-15 LAB — URINALYSIS, ROUTINE W REFLEX MICROSCOPIC
Bilirubin, UA: NEGATIVE
Glucose, UA: NEGATIVE
Ketones, UA: NEGATIVE
Nitrite, UA: NEGATIVE
Specific Gravity, UA: 1.015 (ref 1.005–1.030)
Urobilinogen, Ur: 0.2 mg/dL (ref 0.2–1.0)
pH, UA: 6 (ref 5.0–7.5)

## 2022-11-15 LAB — MICROSCOPIC EXAMINATION
Bacteria, UA: NONE SEEN
WBC, UA: >30 /hpf — AB (ref 0–5)

## 2022-11-15 MED ORDER — OXYCODONE-ACETAMINOPHEN 5-325 MG PO TABS: 1.0000 | ORAL_TABLET | Freq: Four times a day (QID) | ORAL | 0 refills | Status: DC | PRN

## 2022-11-15 NOTE — Progress Notes (Signed)
11/15/2022 2:38 PM   Tony Powell 03/21/1951 [id number]  Referring provider: Coral Spikes, DO [place],  [place] [address]  Left flank pain   HPI: Tony Powell is a 72yo here for followup for nephrolithiasis and prostate cancer. He continues to have left flank pain. He doe snot know if he passed his left ureteral calculus. No recent imaging. The pain is sharp, intermittent, moderate to severe and nonraditing. He has worsening back pain also which is his lower back. He has not gotten his PSMA PET   PMH: Past Medical History:  Diagnosis Date   Abnormal stress test    Borderline hypertension    Chest discomfort    Atypical   DJD (degenerative joint disease)    Knees, lumbar sacral spine   Hyperlipidemia    Hypertension    Prostate cancer [facility])     Surgical History: Past Surgical History:  Procedure Laterality Date   BACK SURGERY     CYSTOSCOPY WITH FULGERATION  04/18/2021   Procedure: CYSTOSCOPY WITH FULGERATION of BLADDER NECK;  Surgeon: Cleon Gustin, MD;  Location: [facility];  Service: Urology;;   CYSTOSCOPY WITH INJECTION N/A 04/18/2021   Procedure: CYSTOSCOPY WITH INJECTION- Botox 100 units;  Surgeon: Cleon Gustin, MD;  Location: [facility];  Service: Urology;  Laterality: N/A;   TRANSURETHRAL RESECTION OF BLADDER TUMOR N/A 08/25/2020   Procedure: TRANSURETHRAL RESECTION OF BLADDER TUMOR (TURBT);  Surgeon: Cleon Gustin, MD;  Location: [facility];  Service: Urology;  Laterality: N/A;    Home Medications:  Allergies as of 11/15/2022       Reactions   Celebrex [celecoxib]    Eyes drooping and messed up skin on hands        Medication List        Accurate as of November 15, 2022  2:38 PM. If you have any questions, ask your nurse or doctor.          alfuzosin 10 MG 24 hr tablet Commonly known as: UROXATRAL Take 10 mg by mouth daily with breakfast.   cephALEXin 500 MG capsule Commonly known as: KEFLEX Take 500 mg by mouth as needed.    cyanocobalamin 1000 MCG tablet Take 1,000 mcg by mouth daily.   finasteride 5 MG tablet Commonly known as: PROSCAR Take 1 tablet (5 mg total) by mouth daily.   Iron (Ferrous Sulfate) 325 (65 Fe) MG Tabs Take 325 mg by mouth every other day.   megestrol 20 MG tablet Commonly known as: MEGACE Take 20 mg by mouth 3 (three) times daily. prn   multivitamin capsule Take 1 capsule by mouth daily.   oxybutynin 15 MG 24 hr tablet Commonly known as: DITROPAN XL Take 15 mg by mouth at bedtime.   oxyCODONE-acetaminophen 5-325 MG tablet Commonly known as: PERCOCET/ROXICET Take 1 tablet by mouth every 6 (six) hours as needed for severe pain.   rosuvastatin 20 MG tablet Commonly known as: Crestor Take one tablet po mouth daily   sildenafil 100 MG tablet Commonly known as: VIAGRA Take 1 tablet (100 mg total) by mouth daily as needed for erectile dysfunction.   tamsulosin 0.4 MG Caps capsule Commonly known as: Flomax Take 1 capsule (0.4 mg total) by mouth daily.   valsartan 160 MG tablet Commonly known as: DIOVAN Take 1 tablet (160 mg total) by mouth daily.        Allergies:  Allergies  Allergen Reactions   Celebrex [Celecoxib]     Eyes  drooping and messed up skin on hands    Family History: Family History  Problem Relation Age of Onset   Heart attack Mother    Breast cancer Mother        Mastectomy   Leukemia Father    Cancer Brother    Cancer Brother    Cancer Brother    Cancer Brother    Cancer Brother    Colon cancer Neg Hx    Prostate cancer Neg Hx    Pancreatic cancer Neg Hx     Social History:  reports that he quit smoking about 24 years ago. His smoking use included cigarettes. He has a 20.00 pack-year smoking history. He has never used smokeless tobacco. He reports that he does not drink alcohol and does not use drugs.  ROS: All other review of systems were reviewed and are negative except what is noted above in HPI  Physical Exam: BP (!) 146/82    Pulse 86   Constitutional:  Alert and oriented, No acute distress. HEENT: [place] AT, moist mucus membranes.  Trachea midline, no masses. Cardiovascular: No clubbing, cyanosis, or edema. Respiratory: Normal respiratory effort, no increased work of breathing. GI: Abdomen is soft, nontender, nondistended, no abdominal masses GU: No CVA tenderness.  Lymph: No cervical or inguinal lymphadenopathy. Skin: No rashes, bruises or suspicious lesions. Neurologic: Grossly intact, no focal deficits, moving all 4 extremities. Psychiatric: Normal mood and affect.  Laboratory Data: Lab Results  Component Value Date   WBC 5.5 10/06/2022   HGB 11.4 (L) 10/06/2022   HCT 35.5 (L) 10/06/2022   MCV 82.2 10/06/2022   PLT 218 10/06/2022    Lab Results  Component Value Date   CREATININE 1.11 10/06/2022    No results found for: "PSA"  No results found for: "TESTOSTERONE"  No results found for: "HGBA1C"  Urinalysis    Component Value Date/Time   COLORURINE STRAW (A) 10/06/2022 1144   APPEARANCEUR Clear 10/27/2022 1034   LABSPEC 1.005 10/06/2022 1144   PHURINE 7.0 10/06/2022 1144   GLUCOSEU Negative 10/27/2022 1034   HGBUR NEGATIVE 10/06/2022 1144   BILIRUBINUR Negative 10/27/2022 1034   KETONESUR NEGATIVE 10/06/2022 1144   PROTEINUR Negative 10/27/2022 1034   PROTEINUR NEGATIVE 10/06/2022 1144   NITRITE Negative 10/27/2022 1034   NITRITE NEGATIVE 10/06/2022 1144   LEUKOCYTESUR 1+ (A) 10/27/2022 1034   LEUKOCYTESUR NEGATIVE 10/06/2022 1144    Lab Results  Component Value Date   LABMICR See below: 10/27/2022   WBCUA 6-10 (A) 10/27/2022   LABEPIT 0-10 10/27/2022   MUCUS Present 11/09/2021   BACTERIA None seen 10/27/2022    Pertinent Imaging: CT 11/15/2022: Images reviewed and discussed with the patient  No results found for this or any previous visit.  No results found for this or any previous visit.  No results found for this or any previous visit.  No results found for this or any  previous visit.  No results found for this or any previous visit.  No valid procedures specified. No results found for this or any previous visit.  Results for orders placed during the hospital encounter of 10/06/22  CT Renal Stone Study  Narrative CLINICAL DATA:  Flank pain; * Tracking Code: BO *  EXAM: CT ABDOMEN AND PELVIS WITHOUT CONTRAST  TECHNIQUE: Multidetector CT imaging of the abdomen and pelvis was performed following the standard protocol without IV contrast.  RADIATION DOSE REDUCTION: This exam was performed according to the departmental dose-optimization program which includes automated exposure control, adjustment of  the mA and/or kV according to patient size and/or use of iterative reconstruction technique.  COMPARISON:  CT abdomen and pelvis dated September 22, 2020  FINDINGS: Lower chest: No acute abnormality.  Hepatobiliary: No focal liver abnormality is seen. No gallstones, gallbladder wall thickening, or biliary dilatation.  Pancreas: Unremarkable. No pancreatic ductal dilatation or surrounding inflammatory changes.  Spleen: Normal in size without focal abnormality.  Adrenals/Urinary Tract: Mild left hydronephrosis with a 2 mm stone seen in the distal left ureter on series 2, image 73 additional nonobstructing left renal stones are seen. Decreased circumferential masslike thickening of the bladder wall with residual mild wall thickening of the inferior bladder seen on series 2, image 79.  Stomach/Bowel: Stomach is within normal limits. Appendix appears normal. No evidence of bowel wall thickening, distention, or inflammatory changes.  Vascular/Lymphatic: Aortic atherosclerosis. New enlarged right common iliac artery lymph node measuring 1.7 cm in short axis series 2, image 53. Pelvic lymph nodes which enlarged on prior exam have decreased in size. Reference left external iliac lymph node measuring 6 mm in short axis on series 2, image 70,  previously 1.8 cm in short axis.  Reproductive: Prostate is unremarkable.  Other: No abdominal wall hernia or abnormality. No abdominopelvic ascites.  Musculoskeletal: Lipoma of the left gluteus musculature. New lytic lesion of the posterior body of T12.  IMPRESSION: 1. Mild left hydronephrosis with a 2 mm stone seen in the distal left ureter. 2. New enlarged right common iliac artery lymph node and new lytic lesion of the posterior body of T12, consistent with progressive metastatic disease. 3. Decreased circumferential masslike thickening of the bladder wall with residual mild wall thickening of the inferior bladder, compatible with history of bladder malignancy. 4. Pelvic lymph nodes which enlarged on prior September 22, 2020 exam have decreased in size. 5. Aortic Atherosclerosis (ICD10-I70.0).   Electronically Signed By: Yetta Glassman M.D. On: 10/06/2022 12:55   Assessment & Plan:    1. Kidney stones --We discussed the management of kidney stones. These options include observation, ureteroscopy, shockwave lithotripsy (ESWL) and percutaneous nephrolithotomy (PCNL). We discussed which options are relevant to the patient's stone(s). We discussed the natural history of kidney stones as well as the complications of untreated stones and the impact on quality of life without treatment as well as with each of the above listed treatments. We also discussed the efficacy of each treatment in its ability to clear the stone burden. With any of these management options I discussed the signs and symptoms of infection and the need for emergent treatment should these be experienced. For each option we discussed the ability of each procedure to clear the patient of their stone burden.   For observation I described the risks which include but are not limited to silent renal damage, life-threatening infection, need for emergent surgery, failure to pass stone and pain.   For ureteroscopy I  described the risks which include bleeding, infection, damage to contiguous structures, positioning injury, ureteral stricture, ureteral avulsion, ureteral injury, need for prolonged ureteral stent, inability to perform ureteroscopy, need for an interval procedure, inability to clear stone burden, stent discomfort/pain, heart attack, stroke, pulmonary embolus and the inherent risks with general anesthesia.   For shockwave lithotripsy I described the risks which include arrhythmia, kidney contusion, kidney hemorrhage, need for transfusion, pain, inability to adequately break up stone, inability to pass stone fragments, Steinstrasse, infection associated with obstructing stones, need for alternate surgical procedure, need for repeat shockwave lithotripsy, MI, CVA, PE and the  inherent risks with anesthesia/conscious sedation.   For PCNL I described the risks including positioning injury, pneumothorax, hydrothorax, need for chest tube, inability to clear stone burden, renal laceration, arterial venous fistula or malformation, need for embolization of kidney, loss of kidney or renal function, need for repeat procedure, need for prolonged nephrostomy tube, ureteral avulsion, MI, CVA, PE and the inherent risks of general anesthesia.   - The patient would like to proceed with medical expulsive therapy  2. Prostate cancer -PSMA PET   No follow-ups on file.  [name], MD  [facility] [place]

## 2022-11-17 ENCOUNTER — Telehealth: Payer: Self-pay

## 2022-11-17 NOTE — Telephone Encounter (Signed)
Verbal from Dr. Alyson Ingles to inform patient that he has a new stone on the L side and to continue pain meds and flomax at this time.  Patient voiced understanding and will keep upcoming apt to discuss CT further.

## 2022-11-17 NOTE — Telephone Encounter (Signed)
Patient called requesting an update on his CT completed on 01/03.  I informed him he is scheduled to follow up on 01/10 and at that time MD usually reviews results.  He would still like to know his results before this apt.  Can you review CT or call patient with results.

## 2022-11-21 ENCOUNTER — Encounter: Payer: Self-pay | Admitting: Urology

## 2022-11-21 NOTE — Patient Instructions (Signed)

## 2022-11-22 ENCOUNTER — Ambulatory Visit: Payer: Medicare Other | Admitting: Urology

## 2022-11-23 ENCOUNTER — Ambulatory Visit (HOSPITAL_COMMUNITY)
Admission: RE | Admit: 2022-11-23 | Discharge: 2022-11-23 | Disposition: A | Discharge status: Home / Self Care | Payer: Medicare Other | Source: Ambulatory Visit | Attending: Urology | Admitting: Urology

## 2022-11-23 ENCOUNTER — Telehealth: Payer: Self-pay

## 2022-11-23 ENCOUNTER — Encounter (HOSPITAL_COMMUNITY): Payer: Self-pay

## 2022-11-23 DIAGNOSIS — C61 Malignant neoplasm of prostate: Secondary | ICD-10-CM | POA: Insufficient documentation

## 2022-11-23 NOTE — Telephone Encounter (Signed)
Nuclear Medicine called advising that they were unable to complete the PSMA Pet scan today. They advised they were unable to get the correct dosage. Patient has been rescheduled but he advised that he is pain. Patient wanted to know if pain medication could be called into pharmacy.    Thank you

## 2022-11-24 ENCOUNTER — Ambulatory Visit: Payer: Medicare Other | Admitting: Urology

## 2022-11-29 ENCOUNTER — Telehealth: Payer: Self-pay

## 2022-11-29 DIAGNOSIS — N200 Calculus of kidney: Secondary | ICD-10-CM

## 2022-11-29 MED ORDER — OXYCODONE-ACETAMINOPHEN 5-325 MG PO TABS: 1.0000 | ORAL_TABLET | Freq: Four times a day (QID) | ORAL | 0 refills | Status: DC | PRN

## 2022-11-29 NOTE — Telephone Encounter (Signed)
Made patient aware that his rx was sent to his pharmacy and patient voiced understanding.

## 2022-11-29 NOTE — Telephone Encounter (Signed)
Patient called advising his back was causing him pain and wanted the medication below called into pharmacy.   Medication: oxyCODONE-acetaminophen (PERCOCET/ROXICET) 5-325 MG tablet   Pharmacy: [facility] at [place], [address]    Thank you

## 2022-11-30 ENCOUNTER — Ambulatory Visit (HOSPITAL_COMMUNITY)
Admission: RE | Admit: 2022-11-30 | Discharge: 2022-11-30 | Disposition: A | Discharge status: Home / Self Care | Payer: Medicare Other | Source: Ambulatory Visit | Attending: Urology | Admitting: Urology

## 2022-11-30 DIAGNOSIS — I700 Atherosclerosis of aorta: Secondary | ICD-10-CM | POA: Diagnosis not present

## 2022-11-30 DIAGNOSIS — C61 Malignant neoplasm of prostate: Secondary | ICD-10-CM | POA: Insufficient documentation

## 2022-11-30 DIAGNOSIS — C7951 Secondary malignant neoplasm of bone: Secondary | ICD-10-CM | POA: Diagnosis not present

## 2022-11-30 MED ORDER — PIFLIFOLASTAT F 18 (PYLARIFY) INJECTION
9.0000 | Freq: Once | INTRAVENOUS | Status: AC
  Administered 2022-11-30: 8.99 via INTRAVENOUS

## 2022-12-04 ENCOUNTER — Ambulatory Visit (INDEPENDENT_AMBULATORY_CARE_PROVIDER_SITE_OTHER): Payer: Medicare Other | Admitting: Urology

## 2022-12-04 ENCOUNTER — Other Ambulatory Visit: Payer: Self-pay | Admitting: Urology

## 2022-12-04 VITALS — BP 156/91 | HR 106

## 2022-12-04 DIAGNOSIS — C775 Secondary and unspecified malignant neoplasm of intrapelvic lymph nodes: Secondary | ICD-10-CM

## 2022-12-04 DIAGNOSIS — C61 Malignant neoplasm of prostate: Secondary | ICD-10-CM

## 2022-12-04 DIAGNOSIS — N200 Calculus of kidney: Secondary | ICD-10-CM

## 2022-12-04 MED ORDER — OXYCODONE-ACETAMINOPHEN 5-325 MG PO TABS: 1.0000 | ORAL_TABLET | Freq: Four times a day (QID) | ORAL | 0 refills | Status: DC | PRN

## 2022-12-04 MED ORDER — DEGARELIX ACETATE(240 MG DOSE) 120 MG/VIAL ~~LOC~~ SOLR
240.0000 mg | Freq: Once | SUBCUTANEOUS | Status: AC
  Administered 2022-12-04: 240 mg via SUBCUTANEOUS

## 2022-12-04 NOTE — Progress Notes (Signed)
Firmagon Sub Q Injection  Due to Prostate Cancer patient is present today for a Firmagon Injection.   Medication: Mills Koller (Degarelix)  Dose: '240mg'$  Location: right/ left upper abdomen Lot: [id number] Exp: 05/2024  Patient tolerated well, no complications were noted  Performed by: Levi Aland, CMA  Follow up: Follow up as scheduled.

## 2022-12-04 NOTE — Progress Notes (Signed)
12/04/2022 1:19 PM   EZZARD DITMER [birth year] [id number]  Referring provider: Coral Spikes, DO [place],  [place] [address]  Back pain   HPI: [name] is here for followup for prostate cancer and nephrolithiasis. PSMA PET showed iliac lymph nodes and T12, L2 metastasis. He does not know if he passed his left ureteral calculus. He denies nay worsening LUTS. He has moderate to severe back pain that is worse with sitting.   PMH: Past Medical History:  Diagnosis Date   Abnormal stress test    Borderline hypertension    Chest discomfort    Atypical   DJD (degenerative joint disease)    Knees, lumbar sacral spine   Hyperlipidemia    Hypertension    Prostate cancer [facility])     Surgical History: Past Surgical History:  Procedure Laterality Date   BACK SURGERY     CYSTOSCOPY WITH FULGERATION  04/18/2021   Procedure: CYSTOSCOPY WITH FULGERATION of BLADDER NECK;  Surgeon: Cleon Gustin, MD;  Location: [facility];  Service: Urology;;   CYSTOSCOPY WITH INJECTION N/A 04/18/2021   Procedure: CYSTOSCOPY WITH INJECTION- Botox 100 units;  Surgeon: Cleon Gustin, MD;  Location: [facility];  Service: Urology;  Laterality: N/A;   TRANSURETHRAL RESECTION OF BLADDER TUMOR N/A 08/25/2020   Procedure: TRANSURETHRAL RESECTION OF BLADDER TUMOR (TURBT);  Surgeon: Cleon Gustin, MD;  Location: [facility];  Service: Urology;  Laterality: N/A;    Home Medications:  Allergies as of 12/04/2022       Reactions   Celebrex [celecoxib]    Eyes drooping and messed up skin on hands        Medication List        Accurate as of December 04, 2022  1:19 PM. If you have any questions, ask your nurse or doctor.          alfuzosin 10 MG 24 hr tablet Commonly known as: UROXATRAL Take 10 mg by mouth daily with breakfast.   cephALEXin 500 MG capsule Commonly known as: KEFLEX Take 500 mg by mouth as needed.   cyanocobalamin 1000 MCG tablet Take 1,000 mcg by mouth daily.    finasteride 5 MG tablet Commonly known as: PROSCAR Take 1 tablet (5 mg total) by mouth daily.   Iron (Ferrous Sulfate) 325 (65 Fe) MG Tabs Take 325 mg by mouth every other day.   megestrol 20 MG tablet Commonly known as: MEGACE Take 20 mg by mouth 3 (three) times daily. prn   multivitamin capsule Take 1 capsule by mouth daily.   oxybutynin 15 MG 24 hr tablet Commonly known as: DITROPAN XL Take 15 mg by mouth at bedtime.   oxyCODONE-acetaminophen 5-325 MG tablet Commonly known as: PERCOCET/ROXICET Take 1 tablet by mouth every 6 (six) hours as needed for severe pain.   rosuvastatin 20 MG tablet Commonly known as: Crestor Take one tablet po mouth daily   sildenafil 100 MG tablet Commonly known as: VIAGRA Take 1 tablet (100 mg total) by mouth daily as needed for erectile dysfunction.   tamsulosin 0.4 MG Caps capsule Commonly known as: Flomax Take 1 capsule (0.4 mg total) by mouth daily.   valsartan 160 MG tablet Commonly known as: DIOVAN Take 1 tablet (160 mg total) by mouth daily.        Allergies:  Allergies  Allergen Reactions   Celebrex [Celecoxib]     Eyes drooping and messed up skin on hands    Family History: Family History  Problem Relation Age of Onset   Heart attack Mother    Breast cancer Mother        Mastectomy   Leukemia Father    Cancer Brother    Cancer Brother    Cancer Brother    Cancer Brother    Cancer Brother    Colon cancer Neg Hx    Prostate cancer Neg Hx    Pancreatic cancer Neg Hx     Social History:  reports that he quit smoking about 24 years ago. His smoking use included cigarettes. He has a 20.00 pack-year smoking history. He has never used smokeless tobacco. He reports that he does not drink alcohol and does not use drugs.  ROS: All other review of systems were reviewed and are negative except what is noted above in HPI  Physical Exam: BP (!) 156/91   Pulse (!) 106   Constitutional:  Alert and oriented, No acute  distress. HEENT: [place] AT, moist mucus membranes.  Trachea midline, no masses. Cardiovascular: No clubbing, cyanosis, or edema. Respiratory: Normal respiratory effort, no increased work of breathing. GI: Abdomen is soft, nontender, nondistended, no abdominal masses GU: No CVA tenderness.  Lymph: No cervical or inguinal lymphadenopathy. Skin: No rashes, bruises or suspicious lesions. Neurologic: Grossly intact, no focal deficits, moving all 4 extremities. Psychiatric: Normal mood and affect.  Laboratory Data: Lab Results  Component Value Date   WBC 5.5 10/06/2022   HGB 11.4 (L) 10/06/2022   HCT 35.5 (L) 10/06/2022   MCV 82.2 10/06/2022   PLT 218 10/06/2022    Lab Results  Component Value Date   CREATININE 1.11 10/06/2022    No results found for: "PSA"  No results found for: "TESTOSTERONE"  No results found for: "HGBA1C"  Urinalysis    Component Value Date/Time   COLORURINE STRAW (A) 10/06/2022 1144   APPEARANCEUR Cloudy (A) 11/15/2022 1451   LABSPEC 1.005 10/06/2022 1144   PHURINE 7.0 10/06/2022 1144   GLUCOSEU Negative 11/15/2022 1451   HGBUR NEGATIVE 10/06/2022 1144   BILIRUBINUR Negative 11/15/2022 1451   KETONESUR NEGATIVE 10/06/2022 1144   PROTEINUR 1+ (A) 11/15/2022 1451   PROTEINUR NEGATIVE 10/06/2022 1144   NITRITE Negative 11/15/2022 1451   NITRITE NEGATIVE 10/06/2022 1144   LEUKOCYTESUR 2+ (A) 11/15/2022 1451   LEUKOCYTESUR NEGATIVE 10/06/2022 1144    Lab Results  Component Value Date   LABMICR See below: 11/15/2022   WBCUA >30 (A) 11/15/2022   LABEPIT 0-10 11/15/2022   MUCUS Present 11/09/2021   BACTERIA None seen 11/15/2022    Pertinent Imaging: PSMA PET: Images reviewed and discussed with the patient  No results found for this or any previous visit.  No results found for this or any previous visit.  No results found for this or any previous visit.  No results found for this or any previous visit.  No results found for this or any previous  visit.  No valid procedures specified. No results found for this or any previous visit.  Results for orders placed in visit on 11/15/22  CT RENAL STONE STUDY  Narrative CLINICAL DATA:  History of urolithiasis with bilateral flank pain. History of prostate cancer.  * Tracking Code: BO *  EXAM: CT ABDOMEN AND PELVIS WITHOUT CONTRAST  TECHNIQUE: Multidetector CT imaging of the abdomen and pelvis was performed following the standard protocol without IV contrast.  RADIATION DOSE REDUCTION: This exam was performed according to the departmental dose-optimization program which includes automated exposure control, adjustment of the mA and/or kV  according to patient size and/or use of iterative reconstruction technique.  COMPARISON:  October 06, 2022  FINDINGS: Lower chest: Incidental imaging of the lung bases without effusion or sign of consolidative change. Node on scout images there are ovoid areas of increased density over lying the soft tissues of the LEFT chest wall measuring up to 15 mm. These are of uncertain significance not seen on scout imaging from October 06, 2022. Signs of coronary artery calcification of LEFT and RIGHT coronary circulation.  Hepatobiliary: Smooth hepatic contours. No pericholecystic stranding. No gross biliary duct distension. No visible lesion on noncontrast imaging.  Pancreas: Pancreas with normal contours, no signs of inflammation or peripancreatic fluid.  Spleen: Normal.  Adrenals/Urinary Tract: Adrenal glands are normal.  Mild bilateral perinephric stranding. Nephrolithiasis in the LEFT renal pelvis with a 3 mm calculus. No hydronephrosis.  Stranding about the urinary bladder which is under distended. Distal LEFT ureteral calculus 4 mm passage of the calculus that was present in the lower pole of the LEFT kidney into the distal LEFT ureter since the prior exam and the very small calculus seen in the distal LEFT ureter just proximal  to the LEFT UVJ on the previous study is no longer visualized.  Stomach/Bowel: No stranding adjacent to the stomach. No sign of small bowel obstruction or acute small bowel process. Colon with submucosal fat deposition indicative of prior inflammation not changed. Appendix not visible. No secondary signs of inflammation adjacent to the appendix.  Vascular/Lymphatic: Aortic atherosclerosis. No aneurysmal dilation. 2 cm RIGHT common iliac lymph node previously 1.8 cm. No retroperitoneal adenopathy or upper abdominal lymphadenopathy.  No new RIGHT pelvic adenopathy.  Reproductive: Heterogeneous prostate, nonspecific in this patient with history of prostate cancer.  Other: No ascites  Musculoskeletal: Lytic lesion at the T12 level with by lay shin of posterior cortex measures up to 2.6 cm greatest axial dimension. Posterior cortex less distinct than on previous imaging size in greatest dimension is similar but short axis is increased from 1.1-1.6 cm. Spinal degenerative changes. Above lytic process in the spine with early encroachment along the ventral aspect of the central canal and LEFT lateral recess.  IMPRESSION: 1. 4 mm LEFT distal ureteral calculus new from previous imaging with passage of the previously seen smaller distal ureteral calculus. 2. Additional calculus versus is vascular calcification in the LEFT renal hilum. 3. No hydronephrosis. 4. Stranding about the urinary bladder which is under distended. Correlate with any signs of cystitis. Bilateral perinephric stranding remains mild and unchanged 5. Slight interval enlargement of RIGHT common iliac lymph node indicative of metastatic disease. 6. Further vertebral body destruction at T12 with early extension of metastatic process into the anterior central canal and likely LEFT lateral recess, not well assessed. Correlate with any radicular symptoms with MRI as warranted on follow-up for further evaluation. 7. 2 cm  RIGHT common iliac lymph node previously 1.8 cm. 8. Ovoid areas on scout imaging over the low LEFT axilla or inferior scapula, these could be external to the patient or could represent dense lymph nodes or soft tissue lesions. Correlate with physical exam and consider dedicated chest radiography in the absence of obvious material external to the patient to confirm persistence. 9. Signs of coronary artery calcification of LEFT and RIGHT coronary circulation. 10. Aortic atherosclerosis.  Aortic Atherosclerosis (ICD10-I70.0).   Electronically Signed By: Zetta Bills M.D. On: 11/15/2022 16:26   Assessment & Plan:    1. Prostate cancer ([place]) Firmagon '240mg'$  today. Followup 1 month with PSA and  testosterone - Urinalysis, Routine w reflex microscopic  2. Prostate cancer metastatic to intrapelvic lymph node ([facility]) -firmagon '240mg'$  today  3. Nephrolithiasis -continue medical expulsive therapy.    No follow-ups on file.  [name], MD  [facility] [place]

## 2022-12-04 NOTE — Patient Instructions (Signed)
Prostate Cancer  The prostate is a small gland that produces fluid that makes up semen (seminal fluid). It is located below the bladder in men, in front of the rectum. Prostate cancer is the abnormal growth of cells in the prostate gland. What are the causes? The exact cause of this condition is not known. What increases the risk? You are more likely to develop this condition if: You are 72 years of age or older. You have a family history of prostate cancer. You have a family history of breast and ovarian cancer. You have genes that are passed from parent to child (inherited), such as BRCA1 and BRCA2. You have Lynch syndrome. African American men and men of African descent are diagnosed with prostate cancer at higher rates than other men. The reasons for this are not well understood and are likely due to a combination of genetic and environmental factors. What are the signs or symptoms? Symptoms of this condition include: Problems with urination. This may include: A weak or interrupted flow of urine. Trouble starting or stopping urination. Trouble emptying the bladder all the way. The need to urinate more often, especially at night. Blood in urine or semen. Persistent pain or discomfort in the lower back, lower abdomen, or hips. Trouble getting an erection. Weakness or numbness in the legs or feet. How is this diagnosed? This condition can be diagnosed with: A digital rectal exam. For this exam, a health care provider inserts a gloved finger into the rectum to feel the prostate gland. A blood test called a prostate-specific antigen (PSA) test. A procedure in which a sample of tissue is taken from the prostate and checked under a microscope (prostate biopsy). An imaging test called transrectal ultrasonography. Once the condition is diagnosed, tests will be done to determine how far the cancer has spread. This is called staging the cancer. Staging may involve imaging tests, such as a bone  scan, CT scan, PET scan, or MRI. Stages of prostate cancer The stages of prostate cancer are as follows: Stage 1 (I). At this stage, the cancer is found in the prostate only. The cancer is not visible on imaging tests, and it is usually found by accident, such as during prostate surgery. Stage 2 (II). At this stage, the cancer is more advanced than it is in stage 1, but the cancer has not spread outside the prostate. Stage 3 (III). At this stage, the cancer has spread beyond the outer layer of the prostate to nearby tissues. The cancer may be found in the seminal vesicles, which are near the bladder and the prostate. Stage 4 (IV). At this stage, the cancer has spread to other parts of the body, such as the lymph nodes, bones, bladder, rectum, liver, or lungs. Prostate cancer grading Prostate cancer is also graded according to how the cancer cells look under a microscope. This is called the Gleason score and the total score can range from 6-10, indicating how likely it is that the cancer will spread (metastasize) to other parts of the body. The higher the score, the greater the likelihood that the cancer will spread. Gleason 6 or lower: This indicates that the cancer cells look similar to normal prostate cells (well differentiated). Gleason 7: This indicates that the cancer cells look somewhat similar to normal prostate cells (moderately differentiated). Gleason 8, 9, or 10: This indicates that the cancer cells look very different than normal prostate cells (poorly differentiated). How is this treated? Treatment for this condition depends on several  factors, including the stage of the cancer, your age, personal preferences, and your overall health. Talk with your health care provider about treatment options that are recommended for you. Common treatments include: Observation for early stage prostate cancer (active surveillance). This involves having exams, blood tests, and in some cases, more biopsies.  For some men, this is the only treatment needed. Surgery. Types of surgeries include: Open surgery (radical prostatectomy). In this surgery, a larger incision is made to remove the prostate. A laparoscopic radical prostatectomy. This is a surgery to remove the prostate and lymph nodes through several small incisions. It is often referred to as a minimally invasive surgery. A robotic radical prostatectomy. This is laparoscopic surgery to remove the prostate and lymph nodes with the help of robotic arms that are controlled by the surgeon. Cryoablation. This is surgery to freeze and destroy cancer cells. Radiation treatment. Types of radiation treatment include: External beam radiation. This type aims beams of radiation from outside the body at the prostate to destroy cancerous cells. Brachytherapy. This type uses radioactive needles, seeds, wires, or tubes that are implanted into the prostate gland. Like external beam radiation, brachytherapy destroys cancerous cells. An advantage is that this type of radiation limits the damage to surrounding tissue and has fewer side effects. Chemotherapy. This treatment kills cancer cells or stops them from multiplying. It kills both cancer cells and normal cells. Targeted therapy. This treatment uses medicines to kill cancer cells without damaging normal cells. Hormone treatment. This treatment involves taking medicines that act on testosterone, one of the male hormones, by: Stopping your body from producing testosterone. Blocking testosterone from reaching cancer cells. Follow these instructions at home: Lifestyle Do not use any products that contain nicotine or tobacco. These products include cigarettes, chewing tobacco, and vaping devices, such as e-cigarettes. If you need help quitting, ask your health care provider. Eat a healthy diet. To do this: Eat foods that are high in fiber. These include beans, whole grains, and fresh fruits and vegetables. Limit  foods that are high in fat and sugar. These include fried or sweet foods. Treatment for prostate cancer may affect sexual function. If you have a partner, continue to have intimate moments. This may include touching, holding, hugging, and caressing your partner. Get plenty of sleep. Consider joining a support group for men who have prostate cancer. Meeting with a support group may help you learn to manage the stress of having cancer. General instructions Take over-the-counter and prescription medicines only as told by your health care provider. If you have to go to the hospital, notify your cancer specialist (oncologist). Keep all follow-up visits. This is important. Where to find more information American Cancer Society: www.cancer.[place] [employer] Clinical [employer]: www.cancer.net [employer]: www.cancer.gov Contact a health care provider if: You have new or increasing trouble urinating. You have new or increasing blood in your urine. You have new or increasing pain in your hips, back, or chest. Get help right away if: You have weakness or numbness in your legs. You cannot control urination or your bowel movements (incontinence). You have chills or a fever. Summary The prostate is a small gland that is involved in the production of semen. It is located below a man's bladder, in front of the rectum. Prostate cancer is the abnormal growth of cells in the prostate gland. Treatment for this condition depends on the stage of the cancer, your age, personal preferences, and your overall health. Talk with your health care provider about  treatment options that are recommended for you. Consider joining a support group for men who have prostate cancer. Meeting with a support group may help you learn to manage the stress of having cancer. This information is not intended to replace advice given to you by your health care provider. Make sure you discuss any questions you have with  your health care provider. Document Revised: 01/26/2021 Document Reviewed: 01/26/2021 Elsevier Patient Education  [place].

## 2022-12-11 ENCOUNTER — Encounter: Payer: Self-pay | Admitting: Urology

## 2022-12-14 ENCOUNTER — Other Ambulatory Visit: Payer: Self-pay

## 2022-12-14 DIAGNOSIS — N200 Calculus of kidney: Secondary | ICD-10-CM

## 2022-12-14 NOTE — Telephone Encounter (Signed)
Patient called and advised they needed a refill on medication below. Patient did advise that he was going to [facility] Emergency [facility] due to penile pain and frequent urination. I asked patient if he had a hx of UTI's and patient advised he did but he wanted to go to ED for now rather than dropping off a urine sample.    Medication: oxyCODONE-acetaminophen (PERCOCET/ROXICET) 5-325 MG tablet    Pharmacy: [facility] at [place], [address]    Thank you

## 2022-12-15 NOTE — Telephone Encounter (Signed)
Patient called and advised he need  a refill on medication. Patient states he is in a lot of pain in his lower back and side. Patient wants a refill on oxycodone-acetaminophen 5-'325mg'$ . Patient is aware that I will send a task to Dr. Alyson Ingles. Patient voiced understanding

## 2022-12-18 MED ORDER — OXYCODONE-ACETAMINOPHEN 5-325 MG PO TABS: 1.0000 | ORAL_TABLET | Freq: Four times a day (QID) | ORAL | 0 refills | Status: DC | PRN

## 2022-12-18 NOTE — Telephone Encounter (Signed)
Patient called requesting refill on pain medication. Please refill if clinically appropriate.

## 2022-12-21 NOTE — Patient Instructions (Signed)
Tony Powell  12/21/2022     '@PREFPERIOPPHARMACY'$ @   Your procedure is scheduled on  12/26/2022.   Report to [facility] at  0600  A.M.   Call this number if you have problems the morning of surgery:  [phone]  If you experience any cold or flu symptoms such as cough, fever, chills, shortness of breath, etc. between now and your scheduled surgery, please notify us at the above number.   Remember:  Follow the diet and prep instructions given to you by the office.    Take these medicines the morning of surgery with A SIP OF WATER        uroxatrol, proscar, ditropan, oxycodone(if needed).     Do not wear jewelry, make-up or nail polish.  Do not wear lotions, powders, or perfumes, or deodorant.  Do not shave 48 hours prior to surgery.  Men may shave face and neck.  Do not bring valuables to the hospital.  [facility] is not responsible for any belongings or valuables.  Contacts, dentures or bridgework may not be worn into surgery.  Leave your suitcase in the car.  After surgery it may be brought to your room.  For patients admitted to the hospital, discharge time will be determined by your treatment team.  Patients discharged the day of surgery will not be allowed to drive home and must have someone with them for 24 hours.    Special instructions:   DO NOT smoke tobacco or vape for 24 hours before your procedure.  Please read over the following fact sheets that you were given. Anesthesia Post-op Instructions and Care and Recovery After Surgery      Colonoscopy, Adult, Care After The following information offers guidance on how to care for yourself after your procedure. Your health care provider may also give you more specific instructions. If you have problems or questions, contact your health care provider. What can I expect after the procedure? After the procedure, it is common to have: A small amount of blood in your stool for 24 hours after the  procedure. Some gas. Mild cramping or bloating of your abdomen. Follow these instructions at home: Eating and drinking  Drink enough fluid to keep your urine pale yellow. Follow instructions from your health care provider about eating or drinking restrictions. Resume your normal diet as told by your health care provider. Avoid heavy or fried foods that are hard to digest. Activity Rest as told by your health care provider. Avoid sitting for a long time without moving. Get up to take short walks every 1-2 hours. This is important to improve blood flow and breathing. Ask for help if you feel weak or unsteady. Return to your normal activities as told by your health care provider. Ask your health care provider what activities are safe for you. Managing cramping and bloating  Try walking around when you have cramps or feel bloated. If directed, apply heat to your abdomen as told by your health care provider. Use the heat source that your health care provider recommends, such as a moist heat pack or a heating pad. Place a towel between your skin and the heat source. Leave the heat on for 20-30 minutes. Remove the heat if your skin turns bright red. This is especially important if you are unable to feel pain, heat, or cold. You have a greater risk of getting burned. General instructions If you were given a sedative during the procedure, it  can affect you for several hours. Do not drive or operate machinery until your health care provider says that it is safe. For the first 24 hours after the procedure: Do not sign important documents. Do not drink alcohol. Do your regular daily activities at a slower pace than normal. Eat soft foods that are easy to digest. Take over-the-counter and prescription medicines only as told by your health care provider. Keep all follow-up visits. This is important. Contact a health care provider if: You have blood in your stool 2-3 days after the procedure. Get  help right away if: You have more than a small spotting of blood in your stool. You have large blood clots in your stool. You have swelling of your abdomen. You have nausea or vomiting. You have a fever. You have increasing pain in your abdomen that is not relieved with medicine. These symptoms may be an emergency. Get help right away. Call 911. Do not wait to see if the symptoms will go away. Do not drive yourself to the hospital. Summary After the procedure, it is common to have a small amount of blood in your stool. You may also have mild cramping and bloating of your abdomen. If you were given a sedative during the procedure, it can affect you for several hours. Do not drive or operate machinery until your health care provider says that it is safe. Get help right away if you have a lot of blood in your stool, nausea or vomiting, a fever, or increased pain in your abdomen. This information is not intended to replace advice given to you by your health care provider. Make sure you discuss any questions you have with your health care provider. Document Revised: 06/22/2021 Document Reviewed: 06/22/2021 Elsevier Patient Education  [place] After The following information offers guidance on how to care for yourself after your procedure. Your health care provider may also give you more specific instructions. If you have problems or questions, contact your health care provider. What can I expect after the procedure? After the procedure, it is common to have: Tiredness. Little or no memory about what happened during or after the procedure. Impaired judgment when it comes to making decisions. Nausea or vomiting. Some trouble with balance. Follow these instructions at home: For the time period you were told by your health care provider:  Rest. Do not participate in activities where you could fall or become injured. Do not drive or use machinery. Do  not drink alcohol. Do not take sleeping pills or medicines that cause drowsiness. Do not make important decisions or sign legal documents. Do not take care of children on your own. Medicines Take over-the-counter and prescription medicines only as told by your health care provider. If you were prescribed antibiotics, take them as told by your health care provider. Do not stop using the antibiotic even if you start to feel better. Eating and drinking Follow instructions from your health care provider about what you may eat and drink. Drink enough fluid to keep your urine pale yellow. If you vomit: Drink clear fluids slowly and in small amounts as you are able. Clear fluids include water, ice chips, low-calorie sports drinks, and fruit juice that has water added to it (diluted fruit juice). Eat light and bland foods in small amounts as you are able. These foods include bananas, applesauce, rice, lean meats, toast, and crackers. General instructions  Have a responsible adult stay with you for the time you  are told. It is important to have someone help care for you until you are awake and alert. If you have sleep apnea, surgery and some medicines can increase your risk for breathing problems. Follow instructions from your health care provider about wearing your sleep device: When you are sleeping. This includes during daytime naps. While taking prescription pain medicines, sleeping medicines, or medicines that make you drowsy. Do not use any products that contain nicotine or tobacco. These products include cigarettes, chewing tobacco, and vaping devices, such as e-cigarettes. If you need help quitting, ask your health care provider. Contact a health care provider if: You feel nauseous or vomit every time you eat or drink. You feel light-headed. You are still sleepy or having trouble with balance after 24 hours. You get a rash. You have a fever. You have redness or swelling around the IV  site. Get help right away if: You have trouble breathing. You have new confusion after you get home. These symptoms may be an emergency. Get help right away. Call 911. Do not wait to see if the symptoms will go away. Do not drive yourself to the hospital. This information is not intended to replace advice given to you by your health care provider. Make sure you discuss any questions you have with your health care provider. Document Revised: 03/27/2022 Document Reviewed: 03/27/2022 Elsevier Patient Education  [place].

## 2022-12-22 ENCOUNTER — Encounter (HOSPITAL_COMMUNITY): Payer: Self-pay | Admitting: *Deleted

## 2022-12-22 ENCOUNTER — Other Ambulatory Visit: Payer: Self-pay | Admitting: Urology

## 2022-12-22 ENCOUNTER — Other Ambulatory Visit: Payer: Self-pay | Admitting: Family Medicine

## 2022-12-22 ENCOUNTER — Encounter (HOSPITAL_COMMUNITY)
Admission: RE | Admit: 2022-12-22 | Discharge: 2022-12-22 | Disposition: A | Discharge status: Home / Self Care | Payer: Medicare Other | Source: Ambulatory Visit | Attending: Gastroenterology | Admitting: Gastroenterology

## 2022-12-22 ENCOUNTER — Emergency Department (HOSPITAL_COMMUNITY)
Admission: EM | Admit: 2022-12-22 | Discharge: 2022-12-22 | Discharge status: Against Medical Advice | Payer: Medicare Other | Attending: Emergency Medicine | Admitting: Emergency Medicine

## 2022-12-22 ENCOUNTER — Other Ambulatory Visit: Payer: Self-pay

## 2022-12-22 VITALS — BP 155/90 | HR 93 | Temp 98.5°F | Resp 18 | Ht 70.0 in | Wt 246.7 lb

## 2022-12-22 DIAGNOSIS — R000 Tachycardia, unspecified: Secondary | ICD-10-CM | POA: Insufficient documentation

## 2022-12-22 DIAGNOSIS — I10 Essential (primary) hypertension: Secondary | ICD-10-CM | POA: Insufficient documentation

## 2022-12-22 DIAGNOSIS — I454 Nonspecific intraventricular block: Secondary | ICD-10-CM | POA: Insufficient documentation

## 2022-12-22 DIAGNOSIS — R300 Dysuria: Secondary | ICD-10-CM | POA: Diagnosis not present

## 2022-12-22 DIAGNOSIS — Z5321 Procedure and treatment not carried out due to patient leaving prior to being seen by health care provider: Secondary | ICD-10-CM | POA: Diagnosis not present

## 2022-12-22 DIAGNOSIS — R109 Unspecified abdominal pain: Secondary | ICD-10-CM | POA: Insufficient documentation

## 2022-12-22 DIAGNOSIS — Z01812 Encounter for preprocedural laboratory examination: Secondary | ICD-10-CM | POA: Insufficient documentation

## 2022-12-22 LAB — URINALYSIS, ROUTINE W REFLEX MICROSCOPIC
Bilirubin Urine: NEGATIVE
Glucose, UA: NEGATIVE mg/dL
Ketones, ur: NEGATIVE mg/dL
Nitrite: NEGATIVE
Protein, ur: NEGATIVE mg/dL
RBC / HPF: >50 RBC/hpf (ref 0–5)
Specific Gravity, Urine: 1.012 (ref 1.005–1.030)
WBC, UA: >50 WBC/hpf (ref 0–5)
pH: 6 (ref 5.0–8.0)

## 2022-12-22 NOTE — ED Notes (Signed)
Pt informed staff of leaving without being seen. Pt with steady gait. Pt refused to stay.

## 2022-12-22 NOTE — ED Triage Notes (Signed)
Pt here to do preop for colonoscopy but brought here to ED due to pt with left flank pain. Pt started today.  Denies any blood in urine. Burning with urination at times per pt.

## 2022-12-25 ENCOUNTER — Telehealth: Payer: Self-pay

## 2022-12-25 ENCOUNTER — Other Ambulatory Visit: Payer: Self-pay

## 2022-12-25 ENCOUNTER — Encounter (HOSPITAL_COMMUNITY)
Admit: 2022-12-25 | Discharge: 2022-12-25 | Disposition: A | Discharge status: Home / Self Care | Payer: Medicare Other | Attending: Gastroenterology | Admitting: Gastroenterology

## 2022-12-25 ENCOUNTER — Telehealth (INDEPENDENT_AMBULATORY_CARE_PROVIDER_SITE_OTHER): Payer: Self-pay | Admitting: *Deleted

## 2022-12-25 DIAGNOSIS — R1084 Generalized abdominal pain: Secondary | ICD-10-CM

## 2022-12-25 DIAGNOSIS — N200 Calculus of kidney: Secondary | ICD-10-CM

## 2022-12-25 NOTE — Progress Notes (Signed)
Open in error

## 2022-12-25 NOTE — Telephone Encounter (Signed)
Spoke with pt. He is aware it was cancelled for tomorrow. He already has called urology to get them to follow up on his pain

## 2022-12-25 NOTE — Progress Notes (Signed)
Patient was here for  PAT on Friday 12/22/2022.  He was having excruciating pain on his left side.  Transferred to the ER for evaluation with the patients consent. Followed up via phone call with the patient this morning and he states that he left the ER without being seen due to the long wait.  Patient has not been on clear liquids as instructed and continues to request pain meds for his excruciating pain.  Patient will be cancelled and rescheduled after follow up with his provider.

## 2022-12-25 NOTE — Addendum Note (Signed)
Addended by: Darcella Gasman R on: 12/25/2022 02:57 PM   Modules accepted: Orders

## 2022-12-25 NOTE — Transitions of Care (Post Inpatient/ED Visit) (Unsigned)
   12/25/2022  Name: Tony Powell MRN: [id number] DOB: 02/21/1951  Today's TOC FU Call Status: Today's TOC FU Call Status:: Unsuccessul Call (1st Attempt) Unsuccessful Call (1st Attempt) Date: 12/25/22 Patient lelf AMA Attempted to reach the patient regarding the most recent Inpatient/ED visit.  Follow Up Plan: Additional outreach attempts will be made to reach the patient to complete the Transitions of Care (Post Inpatient/ED visit) call.   Signature Juanda Crumble, [place] Direct Dial [phone]

## 2022-12-25 NOTE — Telephone Encounter (Signed)
Called pt. He stated as long as he is not sitting up or standing he feels okay. He reports no one can tell him what's going on. I asked if he has called Dr. Noland Fordyce office and he said they already know about his problem and he had a PET done last month and hasn't heard about the results. He said someone from the hospital already called him and told him his procedure was cancelled. He is wondering if Dr. Jenetta Downer had any recs?

## 2022-12-25 NOTE — Telephone Encounter (Signed)
-----   Message from Harvel Quale, MD sent at 12/22/2022  1:54 PM EST ----- Seems he had a history of urinary stones per Dr. Dani Gobble note, maybe he is passing one  Please call him Monday morning - hf he is not hospitalized and feels better we can do his case Tuesday ----- Message ----- From: Jacqulynn Cadet, RN Sent: 12/22/2022   1:35 PM EST To: Louann Sjogren, MD; Cheron Every, CMA; #  Patient presents to [place] for PAT work up for TCS/EGD on 12/26/22.  Pt has a history of prostate cancer and is having severe abdominal pain on left side. He was sent to the ER for an evaluation.  Do we need to cancel

## 2022-12-25 NOTE — Telephone Encounter (Signed)
It is hard to tell why someone has acute abdominal pain without examining him or having some recent labs - hence, we recommended him to go the ER to have further evaluation but he decided to leave before being examined. Needs to be seen soon by his PCP or maybe he can get an early appointment with [place], if not to be examined in th ER would be appropriate. Should try to reach Dr. Maxcine Ham regarding CT/PET results.

## 2022-12-25 NOTE — Telephone Encounter (Signed)
Open in error

## 2022-12-25 NOTE — Telephone Encounter (Signed)
Patient left voicemail to see if his colonoscopy has been rescheduled. He is not eating until he hears back but states he needs to eat. ( Mindy I saw the note where he told you someone at hospital canceled his procedure but it looks like still scheduled to me)   [phone]

## 2022-12-25 NOTE — Telephone Encounter (Signed)
Called pt and made aware of recs. He reports he left bc he couldn't stand the pain. Aware he needs to call PCP and urology office.

## 2022-12-25 NOTE — Telephone Encounter (Signed)
Patient called in today and express he is having lower flank pain. Made patient aware that a CT stone study order has been place and once we get an approval someone will call him back. Patient voiced understanding

## 2022-12-26 ENCOUNTER — Ambulatory Visit (HOSPITAL_COMMUNITY): Admission: RE | Admit: 2022-12-26 | Payer: Medicare Other | Source: Home / Self Care | Admitting: Gastroenterology

## 2022-12-26 ENCOUNTER — Encounter (HOSPITAL_COMMUNITY): Admission: RE | Payer: Self-pay | Source: Home / Self Care

## 2022-12-26 ENCOUNTER — Ambulatory Visit (HOSPITAL_COMMUNITY): Admission: RE | Admit: 2022-12-26 | Payer: Medicare Other | Source: Ambulatory Visit

## 2022-12-26 SURGERY — COLONOSCOPY WITH PROPOFOL
Anesthesia: Monitor Anesthesia Care

## 2022-12-27 NOTE — Transitions of Care (Post Inpatient/ED Visit) (Signed)
   12/27/2022  Name: Tony Powell MRN: [id number] DOB: January 09, 1951  Today's TOC FU Call Status: Today's TOC FU Call Status:: Unsuccessful Call (2nd Attempt) Unsuccessful Call (1st Attempt) Date: 12/25/22 Unsuccessful Call (2nd Attempt) Date: 12/27/22  Attempted to reach the patient regarding the most recent Inpatient/ED visit.  Follow Up Plan: Additional outreach attempts will be made to reach the patient to complete the Transitions of Care (Post Inpatient/ED visit) call.   Signature Juanda Crumble, [place] Direct Dial [phone]

## 2023-01-01 ENCOUNTER — Encounter (HOSPITAL_COMMUNITY): Payer: Self-pay

## 2023-01-01 ENCOUNTER — Emergency Department (HOSPITAL_COMMUNITY)
Admission: EM | Admit: 2023-01-01 | Discharge: 2023-01-01 | Disposition: A | Discharge status: Home / Self Care | Payer: Medicare Other | Attending: Student | Admitting: Student

## 2023-01-01 ENCOUNTER — Other Ambulatory Visit: Payer: Self-pay

## 2023-01-01 DIAGNOSIS — Z79899 Other long term (current) drug therapy: Secondary | ICD-10-CM | POA: Diagnosis not present

## 2023-01-01 DIAGNOSIS — I10 Essential (primary) hypertension: Secondary | ICD-10-CM | POA: Insufficient documentation

## 2023-01-01 DIAGNOSIS — R1032 Left lower quadrant pain: Secondary | ICD-10-CM | POA: Diagnosis present

## 2023-01-01 DIAGNOSIS — N201 Calculus of ureter: Secondary | ICD-10-CM | POA: Diagnosis not present

## 2023-01-01 DIAGNOSIS — Z8546 Personal history of malignant neoplasm of prostate: Secondary | ICD-10-CM | POA: Insufficient documentation

## 2023-01-01 LAB — COMPREHENSIVE METABOLIC PANEL
ALT: 21 U/L (ref 0–44)
AST: 20 U/L (ref 15–41)
Albumin: 4.2 g/dL (ref 3.5–5.0)
Alkaline Phosphatase: 75 U/L (ref 38–126)
Anion gap: 10 (ref 5–15)
BUN: 8 mg/dL (ref 8–23)
CO2: 23 mmol/L (ref 22–32)
Calcium: 9.3 mg/dL (ref 8.9–10.3)
Chloride: 106 mmol/L (ref 98–111)
Creatinine, Ser: 1.21 mg/dL (ref 0.61–1.24)
GFR, Estimated: >60 mL/min (ref >60)
Glucose, Bld: 110 mg/dL — ABNORMAL HIGH (ref 70–99)
Potassium: 3.7 mmol/L (ref 3.5–5.1)
Sodium: 139 mmol/L (ref 135–145)
Total Bilirubin: 1.2 mg/dL (ref 0.3–1.2)
Total Protein: 7.6 g/dL (ref 6.5–8.1)

## 2023-01-01 LAB — CBC
HCT: 39.6 % (ref 39.0–52.0)
Hemoglobin: 12.7 g/dL — ABNORMAL LOW (ref 13.0–17.0)
MCH: 26.5 pg (ref 26.0–34.0)
MCHC: 32.1 g/dL (ref 30.0–36.0)
MCV: 82.7 fL (ref 80.0–100.0)
Platelets: 215 10*3/uL (ref 150–400)
RBC: 4.79 MIL/uL (ref 4.22–5.81)
RDW: 15.1 % (ref 11.5–15.5)
WBC: 6.6 10*3/uL (ref 4.0–10.5)
nRBC: 0 % (ref 0.0–0.2)

## 2023-01-01 LAB — LIPASE, BLOOD: Lipase: 25 U/L (ref 11–51)

## 2023-01-01 MED ORDER — OXYCODONE-ACETAMINOPHEN 5-325 MG PO TABS: 1.0000 | ORAL_TABLET | ORAL | 0 refills | Status: DC | PRN

## 2023-01-01 MED ORDER — KETOROLAC TROMETHAMINE 30 MG/ML IJ SOLN
30.0000 mg | Freq: Once | INTRAMUSCULAR | Status: AC
  Administered 2023-01-01: 30 mg via INTRAMUSCULAR
  Filled 2023-01-01: qty 1

## 2023-01-01 NOTE — ED Triage Notes (Signed)
Possible hernia in left upper inguinal region.  Complains of pain over a month.  Reports now having difficulty having BM

## 2023-01-01 NOTE — Discharge Instructions (Addendum)
You have been prescribed pain medicine to help you as you are passing this kidney stone.  Continue to strain your urine so you will know when this passes.  Continue taking the Flomax that Dr. Alyson Ingles has prescribed you as well.  Do not drive within 4 hours of taking the oxycodone as this medication will make you drowsy.

## 2023-01-01 NOTE — ED Notes (Signed)
Patient called Ready to go home

## 2023-01-02 ENCOUNTER — Telehealth: Payer: Self-pay

## 2023-01-02 NOTE — Transitions of Care (Post Inpatient/ED Visit) (Unsigned)
   01/02/2023  Name: Tony Powell MRN: [id number] DOB: 23-Feb-1951  Today's TOC FU Call Status: Today's TOC FU Call Status:: Unsuccessul Call (1st Attempt) Unsuccessful Call (1st Attempt) Date: 01/02/23  Attempted to reach the patient regarding the most recent Inpatient/ED visit.  Follow Up Plan: Additional outreach attempts will be made to reach the patient to complete the Transitions of Care (Post Inpatient/ED visit) call.   Signature Juanda Crumble, [place] Direct Dial [phone]

## 2023-01-03 NOTE — ED Provider Notes (Signed)
[facility] Provider Note   CSN: [id number] Arrival date & time: 01/01/23  1405     History  Chief Complaint  Patient presents with   Hernia    Tony Powell is a 72 y.o. male with a history including hypertension, hyperlipidemia, history of prostate cancer under the care of Dr. Alyson Ingles with recent CT imaging confirming return of his cancer with metastatic involvement including his T12 and L2 vertebrae presenting with complaints of pain in his left lower abdomen which has been present for the past month or more.  This recent imaging also confirmed a 4 mm distal ureteral stone.  He is unaware if he has passed the stone since this CT study completed on 11/15/2022.  His pain is worse with movement, better at rest.  He was prescribed oxycodone by Dr. Alyson Ingles and has run out of this medication.  He is asking for refill of this medication today.  He does endorse having some harder than normal stools but is having a nearly daily bowel movement.  He denies fevers or chills, dysuria or hematuria.  No nausea or vomiting.    The history is provided by the patient.       Home Medications Prior to Admission medications   Medication Sig Start Date End Date Taking? Authorizing Provider  oxyCODONE-acetaminophen (PERCOCET/ROXICET) 5-325 MG tablet Take 1 tablet by mouth every 4 (four) hours as needed. 01/01/23  Yes Shalisha Clausing, Almyra Free, PA-C  alfuzosin (UROXATRAL) 10 MG 24 hr tablet Take 10 mg by mouth daily with breakfast.    [provider]  cephALEXin (KEFLEX) 500 MG capsule Take 500 mg by mouth 2 (two) times daily as needed (burning during urination).    [provider]  finasteride (PROSCAR) 5 MG tablet Take 1 tablet (5 mg total) by mouth daily. 05/12/22   McKenzie, Candee Furbish, MD  Iron, Ferrous Sulfate, 325 (65 Fe) MG TABS Take 325 mg by mouth every other day. 08/20/22   Coral Spikes, DO  megestrol (MEGACE) 20 MG tablet TAKE ONE (1) TABLET (20 MG  TOTAL) BY MOUTH THREE (3) (THREE) TIMES DAILY AS NEEDED. 12/25/22   McKenzie, Candee Furbish, MD  Multiple Vitamin (MULTIVITAMIN) capsule Take 1 capsule by mouth daily.    [provider]  oxybutynin (DITROPAN XL) 15 MG 24 hr tablet TAKE ONE (1) TABLET (15 MG TOTAL) BY MOUTH DAILY. 12/05/22   McKenzie, Candee Furbish, MD  rosuvastatin (CRESTOR) 20 MG tablet TAKE ONE TABLET BY MOUTH MOUTH DAILY 12/25/22   Coral Spikes, DO  sildenafil (VIAGRA) 100 MG tablet Take 1 tablet (100 mg total) by mouth daily as needed for erectile dysfunction. 08/25/22   Coral Spikes, DO  tamsulosin (FLOMAX) 0.4 MG CAPS capsule Take 1 capsule (0.4 mg total) by mouth daily. Patient not taking: Reported on 12/19/2022 10/27/22   Cleon Gustin, MD  valsartan (DIOVAN) 160 MG tablet Take 1 tablet (160 mg total) by mouth daily. 08/17/22   Coral Spikes, DO      Allergies    Celebrex [celecoxib]    Review of Systems   Review of Systems  Constitutional:  Negative for chills and fever.  HENT:  Negative for congestion and sore throat.   Eyes: Negative.   Respiratory:  Negative for chest tightness and shortness of breath.   Cardiovascular:  Negative for chest pain.  Gastrointestinal:  Positive for abdominal pain and constipation. Negative for nausea and vomiting.  Genitourinary: Negative.  Negative  for dysuria, scrotal swelling and testicular pain.  Musculoskeletal:  Positive for back pain. Negative for arthralgias, joint swelling and neck pain.  Skin: Negative.  Negative for rash and wound.  Neurological:  Negative for dizziness, weakness, light-headedness, numbness and headaches.  Psychiatric/Behavioral: Negative.      Physical Exam Updated Vital Signs BP (!) 195/84   Pulse 65   Temp 98.7 F (37.1 C) (Oral)   Resp 16   Ht '5\' 10"'$  (1.778 m)   Wt 111.6 kg   SpO2 100%   BMI 35.30 kg/m  Physical Exam Vitals and nursing note reviewed.  Constitutional:      Appearance: He is well-developed.  HENT:     Head:  Normocephalic and atraumatic.  Eyes:     Conjunctiva/sclera: Conjunctivae normal.  Cardiovascular:     Rate and Rhythm: Normal rate and regular rhythm.     Heart sounds: Normal heart sounds.  Pulmonary:     Effort: Pulmonary effort is normal.     Breath sounds: Normal breath sounds. No wheezing.  Abdominal:     General: Bowel sounds are normal.     Palpations: Abdomen is soft.     Tenderness: There is no abdominal tenderness. There is no guarding or rebound.     Hernia: No hernia is present.  Musculoskeletal:        General: No swelling or deformity. Normal range of motion.     Cervical back: Normal range of motion.  Skin:    General: Skin is warm and dry.  Neurological:     Mental Status: He is alert.     ED Results / Procedures / Treatments   Labs (all labs ordered are listed, but only abnormal results are displayed) Labs Reviewed  COMPREHENSIVE METABOLIC PANEL - Abnormal; Notable for the following components:      Result Value   Glucose, Bld 110 (*)    All other components within normal limits  CBC - Abnormal; Notable for the following components:   Hemoglobin 12.7 (*)    All other components within normal limits  LIPASE, BLOOD    EKG None  Radiology No results found.  Procedures Procedures    Medications Ordered in ED Medications  ketorolac (TORADOL) 30 MG/ML injection 30 mg (30 mg Intramuscular Given 01/01/23 1902)    ED Course/ Medical Decision Making/ A&P                             Medical Decision Making Patient presenting with left lower pelvic pain present for over a month, small left ureteral stone diagnosed early January, patient is unsure if he has passed the stone.  He also has a new diagnosis of recurring and now metastatic prostate cancer, under the care of Dr. Alyson Ingles.  Documented metastatic involvement of his T12 and L2 vertebrae and along his left ribs.  He is out of his pain medication and is here for a refill of this medicine.  He denies  fevers or chills.  His basic labs are reassuring, however he has not provided [place] with a urine sample.  When asked to provide this he stated he has just urinated and no one asked him to collect a sample.  He is unwilling to try and give another specimen, stating he has already been here too long and just wants some pain medicine.  He denies dysuria.  I explained to him that if he has a UTI and a retained kidney  stone, this could make him very ill.  He is still refusing to provide another urine specimen.  I will give him a small course of oxycodone given his known metastatic disease.  He was advised that he needs close follow-up with Dr. Alyson Ingles and he needs to return here if he develops worsening symptoms including fevers, nausea or vomiting.  He was also advised that this pain medicine can cause constipation and he should be on a stool softener while on pain medications.  Amount and/or Complexity of Data Reviewed Labs: ordered.    Details: CBC c-Met and lipase reviewed, reassuring.  He does have a hemoglobin of 12.7His last hemoglobin 3 months ago was 11.4.  Risk Prescription drug management. Risk Details: [place] narcotic database was reviewed prior to prescribing medication.           Final Clinical Impression(s) / ED Diagnoses Final diagnoses:  Ureteral stone    Rx / DC Orders ED Discharge Orders          Ordered    oxyCODONE-acetaminophen (PERCOCET/ROXICET) 5-325 MG tablet  Every 4 hours PRN        01/01/23 1849              Evalee Jefferson, PA-C 01/05/23 0050    Kommor, Debe Coder, MD 01/05/23 [phone]

## 2023-01-03 NOTE — Transitions of Care (Post Inpatient/ED Visit) (Signed)
   01/03/2023  Name: Tony Powell MRN: [id number] DOB: 02/09/1951  Today's TOC FU Call Status: Today's TOC FU Call Status:: Unsuccessful Call (2nd Attempt) Unsuccessful Call (1st Attempt) Date: 01/02/23 Unsuccessful Call (2nd Attempt) Date: 01/03/23  Attempted to reach the patient regarding the most recent Inpatient/ED visit.  Follow Up Plan: No further outreach attempts will be made at this time. We have been unable to contact the patient.  Signature Juanda Crumble, [place] Direct Dial [phone]

## 2023-01-08 ENCOUNTER — Ambulatory Visit: Payer: Self-pay | Admitting: *Deleted

## 2023-01-08 NOTE — Chronic Care Management (AMB) (Signed)
   01/08/2023  HAIRO DOOLY May 11, 1951 [id number]   ERROR   please disregard  Jacqlyn Larsen [facility], BSN RN Case Manager [phone]

## 2023-01-10 ENCOUNTER — Emergency Department (HOSPITAL_COMMUNITY): Payer: Medicare Other

## 2023-01-10 ENCOUNTER — Emergency Department (HOSPITAL_COMMUNITY)
Admission: EM | Admit: 2023-01-10 | Discharge: 2023-01-10 | Disposition: A | Discharge status: Home / Self Care | Payer: Medicare Other | Attending: Emergency Medicine | Admitting: Emergency Medicine

## 2023-01-10 ENCOUNTER — Encounter (HOSPITAL_COMMUNITY): Payer: Self-pay

## 2023-01-10 ENCOUNTER — Telehealth (HOSPITAL_COMMUNITY): Payer: Self-pay | Admitting: Emergency Medicine

## 2023-01-10 ENCOUNTER — Ambulatory Visit: Payer: Medicare Other | Admitting: Urology

## 2023-01-10 DIAGNOSIS — N200 Calculus of kidney: Secondary | ICD-10-CM

## 2023-01-10 DIAGNOSIS — C61 Malignant neoplasm of prostate: Secondary | ICD-10-CM

## 2023-01-10 DIAGNOSIS — R1032 Left lower quadrant pain: Secondary | ICD-10-CM | POA: Diagnosis present

## 2023-01-10 LAB — COMPREHENSIVE METABOLIC PANEL
ALT: 25 U/L (ref 0–44)
AST: 29 U/L (ref 15–41)
Albumin: 4.1 g/dL (ref 3.5–5.0)
Alkaline Phosphatase: 74 U/L (ref 38–126)
Anion gap: 9 (ref 5–15)
BUN: 6 mg/dL — ABNORMAL LOW (ref 8–23)
CO2: 20 mmol/L — ABNORMAL LOW (ref 22–32)
Calcium: 9.1 mg/dL (ref 8.9–10.3)
Chloride: 107 mmol/L (ref 98–111)
Creatinine, Ser: 1.27 mg/dL — ABNORMAL HIGH (ref 0.61–1.24)
GFR, Estimated: >60 mL/min (ref >60)
Glucose, Bld: 118 mg/dL — ABNORMAL HIGH (ref 70–99)
Potassium: 3.5 mmol/L (ref 3.5–5.1)
Sodium: 136 mmol/L (ref 135–145)
Total Bilirubin: 1 mg/dL (ref 0.3–1.2)
Total Protein: 7 g/dL (ref 6.5–8.1)

## 2023-01-10 LAB — CBC WITH DIFFERENTIAL/PLATELET
Abs Immature Granulocytes: 0.02 10*3/uL (ref 0.00–0.07)
Basophils Absolute: 0 10*3/uL (ref 0.0–0.1)
Basophils Relative: 0 %
Eosinophils Absolute: 0.1 10*3/uL (ref 0.0–0.5)
Eosinophils Relative: 1 %
HCT: 38.8 % — ABNORMAL LOW (ref 39.0–52.0)
Hemoglobin: 12.5 g/dL — ABNORMAL LOW (ref 13.0–17.0)
Immature Granulocytes: 0 %
Lymphocytes Relative: 16 %
Lymphs Abs: 1.1 10*3/uL (ref 0.7–4.0)
MCH: 26.5 pg (ref 26.0–34.0)
MCHC: 32.2 g/dL (ref 30.0–36.0)
MCV: 82.4 fL (ref 80.0–100.0)
Monocytes Absolute: 0.5 10*3/uL (ref 0.1–1.0)
Monocytes Relative: 8 %
Neutro Abs: 5.1 10*3/uL (ref 1.7–7.7)
Neutrophils Relative %: 75 %
Platelets: 241 10*3/uL (ref 150–400)
RBC: 4.71 MIL/uL (ref 4.22–5.81)
RDW: 15.4 % (ref 11.5–15.5)
WBC: 6.8 10*3/uL (ref 4.0–10.5)
nRBC: 0 % (ref 0.0–0.2)

## 2023-01-10 LAB — URINALYSIS, ROUTINE W REFLEX MICROSCOPIC
Bilirubin Urine: NEGATIVE
Glucose, UA: NEGATIVE mg/dL
Ketones, ur: NEGATIVE mg/dL
Nitrite: NEGATIVE
Protein, ur: NEGATIVE mg/dL
Specific Gravity, Urine: 1.017 (ref 1.005–1.030)
WBC, UA: >50 WBC/hpf (ref 0–5)
pH: 5 (ref 5.0–8.0)

## 2023-01-10 LAB — LIPASE, BLOOD: Lipase: 33 U/L (ref 11–51)

## 2023-01-10 MED ORDER — OXYCODONE-ACETAMINOPHEN 5-325 MG PO TABS
1.0000 | ORAL_TABLET | Freq: Once | ORAL | Status: AC
  Administered 2023-01-10: 1 via ORAL
  Filled 2023-01-10: qty 1

## 2023-01-10 MED ORDER — OXYCODONE HCL 5 MG PO TABS
5.0000 mg | ORAL_TABLET | Freq: Once | ORAL | Status: AC
  Administered 2023-01-10: 5 mg via ORAL
  Filled 2023-01-10: qty 1

## 2023-01-10 MED ORDER — OXYCODONE HCL 5 MG PO TABS: 5.0000 mg | ORAL_TABLET | Freq: Four times a day (QID) | ORAL | 0 refills | Status: DC | PRN

## 2023-01-10 NOTE — ED Provider Triage Note (Signed)
Emergency Medicine Provider Triage Evaluation Note  Tony Powell , a 72 y.o. male  was evaluated in triage.  Pt complains of left flank pain onset 6 weeks.  Notes he has had associated nausea and vomiting and dysuria.  Has tried his prescription narcotics.  Notes that he has been evaluated by urology.  Denies abdominal pain, hematuria, vomiting.  Denies recent injury or heavy lifting.  Notes that he fell this morning against the door frame and hit his left sided back.  Denies any his head or passing out.  No anticoagulant use.  Per patient chart review: Patient was evaluated for similar concerns on 01/01/2023.  At that time was diagnosed with a ureteral stone.  Review of Systems  Positive:  Negative:   Physical Exam  BP (!) 150/89 (BP Location: Left Arm)   Pulse 80   Temp 98.9 F (37.2 C) (Oral)   Resp 16   Ht '5\' 10"'$  (1.778 m)   Wt 104.3 kg   SpO2 100%   BMI 33.00 kg/m  Gen:   Awake, no distress   Resp:  Normal effort  MSK:   Moves extremities without difficulty  Other:  Left CVA tenderness to palpation.  No tenderness to palpation noted to abdomen.  Medical Decision Making  Medically screening exam initiated at 12:11 PM.  Appropriate orders placed.  Tony Powell was informed that the remainder of the evaluation will be completed by another provider, this initial triage assessment does not replace that evaluation, and the importance of remaining in the ED until their evaluation is complete.  Work-up initiated.    Tony Powell A, PA-C 01/10/23 1218

## 2023-01-10 NOTE — ED Notes (Signed)
Patient reports his pain is easing off some.  He is alert  family is at bedside

## 2023-01-10 NOTE — ED Triage Notes (Signed)
Pt to ED c/o left flank pain radiating to back, reports ongoing x 6 weeks, reports evaluated for the same,  but doesn't remember what' was wrong.

## 2023-01-10 NOTE — ED Provider Notes (Signed)
[facility] Provider Note   CSN: [id number] Arrival date & time: 01/10/23  1157     History {Add pertinent medical, surgical, social history, OB history to HPI:1} Chief Complaint  Patient presents with   Flank Pain        Back Pain    Tony Powell is a 72 y.o. male.  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 Alyson Ingles presents to the emergency department with left-sided back/flank pain.  Patient was recently diagnosed with a 4 mm distal ureteral stone on the left side.  It is unclear if he passed the stone.  He presents today with worsening pain involving the left lower back area.  He is currently out of any pain medicine and nothing has relieved his discomfort.  He describes dysuria and discoloration to the urine.  No nausea/vomiting, fever, diarrhea.  Home Medications Prior to Admission medications   Medication Sig Start Date End Date Taking? Authorizing Provider  alfuzosin (UROXATRAL) 10 MG 24 hr tablet Take 10 mg by mouth daily with breakfast.    [provider]  cephALEXin (KEFLEX) 500 MG capsule Take 500 mg by mouth 2 (two) times daily as needed (burning during urination).    [provider]  finasteride (PROSCAR) 5 MG tablet Take 1 tablet (5 mg total) by mouth daily. 05/12/22   McKenzie, Candee Furbish, MD  Iron, Ferrous Sulfate, 325 (65 Fe) MG TABS Take 325 mg by mouth every other day. 08/20/22   Coral Spikes, DO  megestrol (MEGACE) 20 MG tablet TAKE ONE (1) TABLET (20 MG TOTAL) BY MOUTH THREE (3) (THREE) TIMES DAILY AS NEEDED. 12/25/22   McKenzie, Candee Furbish, MD  Multiple Vitamin (MULTIVITAMIN) capsule Take 1 capsule by mouth daily.    [provider]  oxybutynin (DITROPAN XL) 15 MG 24 hr tablet TAKE ONE (1) TABLET (15 MG TOTAL) BY MOUTH DAILY. 12/05/22   McKenzie, Candee Furbish, MD   oxyCODONE-acetaminophen (PERCOCET/ROXICET) 5-325 MG tablet Take 1 tablet by mouth every 4 (four) hours as needed. 01/01/23   Evalee Jefferson, PA-C  rosuvastatin (CRESTOR) 20 MG tablet TAKE ONE TABLET BY MOUTH MOUTH DAILY 12/25/22   Coral Spikes, DO  sildenafil (VIAGRA) 100 MG tablet Take 1 tablet (100 mg total) by mouth daily as needed for erectile dysfunction. 08/25/22   Coral Spikes, DO  tamsulosin (FLOMAX) 0.4 MG CAPS capsule Take 1 capsule (0.4 mg total) by mouth daily. Patient not taking: Reported on 12/19/2022 10/27/22   Cleon Gustin, MD  valsartan (DIOVAN) 160 MG tablet Take 1 tablet (160 mg total) by mouth daily. 08/17/22   Coral Spikes, DO      Allergies    Celebrex [celecoxib]    Review of Systems   Review of Systems  Constitutional:  Negative for fever.  Respiratory:  Negative for shortness of breath.   Cardiovascular:  Negative for chest pain.  Gastrointestinal:  Negative for abdominal pain, diarrhea and vomiting.  Genitourinary:  Positive for difficulty urinating, dysuria, flank pain and frequency. Negative for penile discharge, penile pain, penile swelling, scrotal swelling and testicular pain.       + dark urine  Skin:  Negative for rash.  Neurological:  Negative for headaches.    Physical Exam Updated Vital Signs BP (!) 196/64 (BP Location: Left Arm)   Pulse 76   Temp 98.5  F (36.9 C) (Oral)   Resp 20   Ht '5\' 10"'$  (1.778 m)   Wt 104.3 kg   SpO2 100%   BMI 33.00 kg/m  Physical Exam Vitals and nursing note reviewed.  Constitutional:      General: He is not in acute distress.    Appearance: Normal appearance. He is not ill-appearing.  HENT:     Head: Normocephalic.     Mouth/Throat:     Mouth: Mucous membranes are moist.  Cardiovascular:     Rate and Rhythm: Normal rate.  Pulmonary:     Effort: Pulmonary effort is normal. No respiratory distress.  Abdominal:     Palpations: Abdomen is soft.     Tenderness: There is no abdominal tenderness. There is no  guarding or rebound.  Musculoskeletal:     Comments: TTP of left lower back, no skin changes  Skin:    General: Skin is warm.  Neurological:     Mental Status: He is alert and oriented to person, place, and time. Mental status is at baseline.  Psychiatric:        Mood and Affect: Mood normal.     ED Results / Procedures / Treatments   Labs (all labs ordered are listed, but only abnormal results are displayed) Labs Reviewed  CBC WITH DIFFERENTIAL/PLATELET - Abnormal; Notable for the following components:      Result Value   Hemoglobin 12.5 (*)    HCT 38.8 (*)    All other components within normal limits  COMPREHENSIVE METABOLIC PANEL - Abnormal; Notable for the following components:   CO2 20 (*)    Glucose, Bld 118 (*)    BUN 6 (*)    Creatinine, Ser 1.27 (*)    All other components within normal limits  URINALYSIS, ROUTINE W REFLEX MICROSCOPIC - Abnormal; Notable for the following components:   APPearance HAZY (*)    Hgb urine dipstick SMALL (*)    Leukocytes,Ua MODERATE (*)    Bacteria, UA RARE (*)    All other components within normal limits  LIPASE, BLOOD    EKG None  Radiology No results found.  Procedures Procedures  {Document cardiac monitor, telemetry assessment procedure when appropriate:1}  Medications Ordered in ED Medications  oxyCODONE-acetaminophen (PERCOCET/ROXICET) 5-325 MG per tablet 1 tablet (1 tablet Oral Given 01/10/23 1602)    ED Course/ Medical Decision Making/ A&P   CLINICAL DATA: Left flank and abdominal pain.  EXAM: CT ABDOMEN AND PELVIS WITHOUT CONTRAST  TECHNIQUE: Multidetector CT imaging of the abdomen and pelvis was performed following the standard protocol without IV contrast.  RADIATION DOSE REDUCTION: This exam was performed according to the departmental dose-optimization program which includes automated exposure control, adjustment of the mA and/or kV according to patient size and/or use of iterative reconstruction  technique.  COMPARISON: 11/15/2022  FINDINGS: Lower chest: The lung bases are clear of acute process. No pleural effusion or pulmonary lesions. The heart is normal in size. No pericardial effusion. Stable aortic and coronary artery calcifications. The distal esophagus and aorta are unremarkable.  Hepatobiliary: Small scattered calcified granulomas but no worrisome hepatic lesions or intrahepatic biliary dilatation. The gallbladder is unremarkable. No common bile duct dilatation.  Pancreas: No mass, inflammation or ductal dilatation.  Spleen: Normal size. No focal lesions.  Adrenals/Urinary Tract: The adrenal glands are normal there is mild left-sided hydroureteronephrosis down to 80 4 mm calculus in the distal left ureter. Small bilateral renal calculi. No right-sided ureteral calculi. No bladder calculi. No obvious renal  or bladder lesions without contrast.  Thick wall bladder is unchanged.  Stomach/Bowel: The stomach, duodenum, small and colon are unremarkable. No acute inflammatory process, mass lesions obstructive findings. There are mild diffuse fibrofatty infiltrative type changes involving the colon suggesting prior inflammatory bowel disease. The terminal ileum and appendix are.  Vascular/Lymphatic: Advanced atherosclerotic calcification involving the aorta and iliac arteries but no aneurysm. Stable 2 cm right common iliac lymph node consistent with known prostate cancer.. Stable small left external iliac nodes also..  Reproductive: The prostate gland and seminal vesicles are unremarkable.  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  Musculoskeletal: Stable lytic destructive bone lesions most notably involving T12 and L2.  IMPRESSION: 1. 4 mm distal left ureteral calculus with mild left-sided hydroureteronephrosis. 2. Small bilateral renal calculi. 3. Stable 2 cm right common iliac lymph node consistent with known prostate  cancer. 4. Stable lytic destructive bone lesions most notably involving T12 and L2. 5. Stable thick wall bladder. 6. Aortic atherosclerosis.  Aortic Atherosclerosis (ICD10-I70.0).   Electronically Signed By: Marijo Sanes M.D. On: [id number]  {   Click here for ABCD2, HEART and other calculatorsREFRESH Note before signing :1}                          Medical Decision Making Amount and/or Complexity of Data Reviewed Radiology: ordered.  Risk Prescription drug management.   ***  {Document critical care time when appropriate:1} {Document review of labs and clinical decision tools ie heart score, Chads2Vasc2 etc:1}  {Document your independent review of radiology images, and any outside records:1} {Document your discussion with family members, caretakers, and with consultants:1} {Document social determinants of health affecting pt's care:1} {Document your decision making why or why not admission, treatments were needed:1} Final Clinical Impression(s) / ED Diagnoses Final diagnoses:  None    Rx / DC Orders ED Discharge Orders     None

## 2023-01-10 NOTE — Discharge Instructions (Addendum)
You have been seen and discharged from the emergency department.  Your imaging again demonstrates a 4 mm distal left kidney stone.  No signs of acute infection in the urine/blood.  Your kidney function is normal for you.  It is extremely important that you follow-up with your urologist as an outpatient for further guidance and treatment.  You have been prescribed a medication called Flomax which will help with the passage of the stone as well as additional pain medicine.  Do not mix this medication with alcohol or other sedating medications. Do not drive or do heavy physical activity until you know how this medication affects you.  It may cause drowsiness.  Take home medications as prescribed. If you have any worsening symptoms or further concerns for your health please return to an emergency department for further evaluation.

## 2023-01-10 NOTE — Telephone Encounter (Signed)
Patient contacted the department, the oxycodone was unavailable at the [facility] that was originally prescribed that.  This prescription has been canceled and a new prescription has been sent to his pharmacy of choice.

## 2023-01-10 NOTE — ED Notes (Signed)
Patient has been drinking water.  He was only able to provide 50 cc urine, amber in color

## 2023-01-10 NOTE — ED Notes (Signed)
Patient bp value communicated with MD, patient is now ready for d/c home.  Patient and family verbalized understanding of discharge instructions and reasons to return to the ED

## 2023-01-11 ENCOUNTER — Telehealth: Payer: Self-pay

## 2023-01-11 NOTE — Transitions of Care (Post Inpatient/ED Visit) (Signed)
   01/11/2023  Name: Tony Powell MRN: [id number] DOB: 20-Oct-1951  Today's TOC FU Call Status: Today's TOC FU Call Status:: Successful TOC FU Call Competed TOC FU Call Complete Date: 01/11/23  Transition Care Management Follow-up Telephone Call Date of Discharge: 01/10/23 [facility]) Type of Discharge: Emergency Department Reason for ED Visit: Other: (kidney stone) How have you been since you were released from the hospital?: Same Any questions or concerns?: No  Items Reviewed: Did you receive and understand the discharge instructions provided?: Yes Medications obtained and verified?: Yes (Medications Reviewed) Any new allergies since your discharge?: No Dietary orders reviewed?: Yes Do you have support at home?: No  Home Care and Equipment/Supplies: [place] Ordered?: NA Any new equipment or medical supplies ordered?: NA  Functional Questionnaire: Do you need assistance with bathing/showering or dressing?: No Do you need assistance with meal preparation?: No Do you need assistance with eating?: No Do you have difficulty maintaining continence: No Do you need assistance with getting out of bed/getting out of a chair/moving?: No Do you have difficulty managing or taking your medications?: No  Folllow up appointments reviewed: PCP Follow-up appointment confirmed?: NA Specialist Hospital Follow-up appointment confirmed?: Yes Date of Specialist follow-up appointment?: 01/12/23 Follow-Up Specialty Provider:: [name] Do you need transportation to your follow-up appointment?: No Do you understand care options if your condition(s) worsen?: Yes-patient verbalized understanding    SIGNATURE Juanda Crumble, [place] Nurse Health Advisor Direct Dial [phone]

## 2023-01-12 ENCOUNTER — Ambulatory Visit (INDEPENDENT_AMBULATORY_CARE_PROVIDER_SITE_OTHER): Payer: Medicare Other | Admitting: Urology

## 2023-01-12 ENCOUNTER — Encounter: Payer: Self-pay | Admitting: Urology

## 2023-01-12 VITALS — BP 173/93 | HR 83

## 2023-01-12 DIAGNOSIS — N200 Calculus of kidney: Secondary | ICD-10-CM

## 2023-01-12 DIAGNOSIS — C61 Malignant neoplasm of prostate: Secondary | ICD-10-CM

## 2023-01-12 MED ORDER — OXYCODONE HCL 5 MG PO TABS: 5.0000 mg | ORAL_TABLET | Freq: Four times a day (QID) | ORAL | 0 refills | Status: DC | PRN

## 2023-01-12 MED ORDER — DEGARELIX ACETATE 80 MG ~~LOC~~ SOLR
80.0000 mg | Freq: Once | SUBCUTANEOUS | Status: AC
  Administered 2023-01-12: 80 mg via SUBCUTANEOUS

## 2023-01-12 NOTE — Progress Notes (Signed)
Firmagon Sub Q Injection  Due to Prostate Cancer patient is present today for a Firmagon Injection.   Medication: Mills Koller (Degarelix)  Dose: '80mg'$  Location: left upper abdomen Lot: [id number] Exp: [id number]  Patient tolerated well, no complications were noted  Performed by: Marisue Brooklyn, CMA  Follow up: Keep office visit

## 2023-01-12 NOTE — H&P (View-Only) (Signed)
 01/12/2023 9:56 AM   Tony Powell 06/23/1951 [id number]  Referring provider: Cook, Jayce G, DO [address] [place],  [place] [address]  Followup nephrolithiasis and prostate cancer   HPI: Tony Powell is a 72yo here for followup for nephrolithiasis and prostate cancer. He has not passed his calculus. CT from 2/28 shows a persistent 4mm left distal ureteral calculus. He has sharp, intermittent, moderate left flank pain. He uses oxycodone prn which improves his left flank pain. He has metastatic prostate cancer and is due for ADT. No recent PSA.    PMH: Past Medical History:  Diagnosis Date   Abnormal stress test    Borderline hypertension    Chest discomfort    Atypical   DJD (degenerative joint disease)    Knees, lumbar sacral spine   Hyperlipidemia    Hypertension    Prostate cancer (HCC)     Surgical History: Past Surgical History:  Procedure Laterality Date   BACK SURGERY     CYSTOSCOPY WITH FULGERATION  04/18/2021   Procedure: CYSTOSCOPY WITH FULGERATION of BLADDER NECK;  Surgeon: Lanell Dubie L, MD;  Location: [facility];  Service: Urology;;   CYSTOSCOPY WITH INJECTION N/A 04/18/2021   Procedure: CYSTOSCOPY WITH INJECTION- Botox 100 units;  Surgeon: Ruweyda Macknight L, MD;  Location: [facility];  Service: Urology;  Laterality: N/A;   TRANSURETHRAL RESECTION OF BLADDER TUMOR N/A 08/25/2020   Procedure: TRANSURETHRAL RESECTION OF BLADDER TUMOR (TURBT);  Surgeon: Nela Bascom L, MD;  Location: [facility];  Service: Urology;  Laterality: N/A;    Home Medications:  Allergies as of 01/12/2023       Reactions   Celebrex [celecoxib]    Eyes drooping and messed up skin on hands        Medication List        Accurate as of January 12, 2023  9:56 AM. If you have any questions, ask your nurse or doctor.          alfuzosin 10 MG 24 hr tablet Commonly known as: UROXATRAL Take 10 mg by mouth daily with breakfast.   atorvastatin 40 MG tablet Commonly known as:  LIPITOR Take 40 mg by mouth daily.   cephALEXin 500 MG capsule Commonly known as: KEFLEX Take 500 mg by mouth 2 (two) times daily as needed (burning during urination).   finasteride 5 MG tablet Commonly known as: PROSCAR Take 1 tablet (5 mg total) by mouth daily.   Iron (Ferrous Sulfate) 325 (65 Fe) MG Tabs Take 325 mg by mouth every other day.   megestrol 20 MG tablet Commonly known as: MEGACE TAKE ONE (1) TABLET (20 MG TOTAL) BY MOUTH THREE (3) (THREE) TIMES DAILY AS NEEDED.   multivitamin capsule Take 1 capsule by mouth daily.   oxybutynin 15 MG 24 hr tablet Commonly known as: DITROPAN XL TAKE ONE (1) TABLET (15 MG TOTAL) BY MOUTH DAILY.   oxyCODONE 5 MG immediate release tablet Commonly known as: Roxicodone Take 1 tablet (5 mg total) by mouth every 6 (six) hours as needed for severe pain.   oxyCODONE-acetaminophen 5-325 MG tablet Commonly known as: PERCOCET/ROXICET Take 1 tablet by mouth every 4 (four) hours as needed.   rosuvastatin 20 MG tablet Commonly known as: CRESTOR TAKE ONE TABLET BY MOUTH MOUTH DAILY   sildenafil 100 MG tablet Commonly known as: VIAGRA Take 1 tablet (100 mg total) by mouth daily as needed for erectile dysfunction.   tamsulosin 0.4 MG Caps capsule Commonly known as: Flomax Take   1 capsule (0.4 mg total) by mouth daily.   valsartan 160 MG tablet Commonly known as: DIOVAN Take 1 tablet (160 mg total) by mouth daily.        Allergies:  Allergies  Allergen Reactions   Celebrex [Celecoxib]     Eyes drooping and messed up skin on hands    Family History: Family History  Problem Relation Age of Onset   Heart attack Mother    Breast cancer Mother        Mastectomy   Leukemia Father    Cancer Brother    Cancer Brother    Cancer Brother    Cancer Brother    Cancer Brother    Colon cancer Neg Hx    Prostate cancer Neg Hx    Pancreatic cancer Neg Hx     Social History:  reports that he quit smoking about 24 years ago. His  smoking use included cigarettes. He has a 20.00 pack-year smoking history. He has never used smokeless tobacco. He reports that he does not drink alcohol and does not use drugs.  ROS: All other review of systems were reviewed and are negative except what is noted above in HPI  Physical Exam: BP (!) 173/93   Pulse 83   Constitutional:  Alert and oriented, No acute distress. HEENT: [place] AT, moist mucus membranes.  Trachea midline, no masses. Cardiovascular: No clubbing, cyanosis, or edema. Respiratory: Normal respiratory effort, no increased work of breathing. GI: Abdomen is soft, nontender, nondistended, no abdominal masses GU: No CVA tenderness.  Lymph: No cervical or inguinal lymphadenopathy. Skin: No rashes, bruises or suspicious lesions. Neurologic: Grossly intact, no focal deficits, moving all 4 extremities. Psychiatric: Normal mood and affect.  Laboratory Data: Lab Results  Component Value Date   WBC 6.8 01/10/2023   HGB 12.5 (L) 01/10/2023   HCT 38.8 (L) 01/10/2023   MCV 82.4 01/10/2023   PLT 241 01/10/2023    Lab Results  Component Value Date   CREATININE 1.27 (H) 01/10/2023    No results found for: "PSA"  No results found for: "TESTOSTERONE"  No results found for: "HGBA1C"  Urinalysis    Component Value Date/Time   COLORURINE YELLOW 01/10/2023 1157   APPEARANCEUR HAZY (A) 01/10/2023 1157   APPEARANCEUR Cloudy (A) 11/15/2022 1451   LABSPEC 1.017 01/10/2023 1157   PHURINE 5.0 01/10/2023 1157   GLUCOSEU NEGATIVE 01/10/2023 1157   HGBUR SMALL (A) 01/10/2023 1157   BILIRUBINUR NEGATIVE 01/10/2023 1157   BILIRUBINUR Negative 11/15/2022 1451   KETONESUR NEGATIVE 01/10/2023 1157   PROTEINUR NEGATIVE 01/10/2023 1157   NITRITE NEGATIVE 01/10/2023 1157   LEUKOCYTESUR MODERATE (A) 01/10/2023 1157    Lab Results  Component Value Date   LABMICR See below: 11/15/2022   WBCUA >30 (A) 11/15/2022   LABEPIT 0-10 11/15/2022   MUCUS Present 11/09/2021   BACTERIA RARE  (A) 01/10/2023    Pertinent Imaging: CT 01/10/2023: Images reviewed and discussed with the patient  No results found for this or any previous visit.  No results found for this or any previous visit.  No results found for this or any previous visit.  No results found for this or any previous visit.  No results found for this or any previous visit.  No valid procedures specified. No results found for this or any previous visit.  Results for orders placed during the hospital encounter of 01/10/23  CT Renal Stone Study  Narrative CLINICAL DATA:  Left flank and abdominal pain.  EXAM: CT ABDOMEN   AND PELVIS WITHOUT CONTRAST  TECHNIQUE: Multidetector CT imaging of the abdomen and pelvis was performed following the standard protocol without IV contrast.  RADIATION DOSE REDUCTION: This exam was performed according to the departmental dose-optimization program which includes automated exposure control, adjustment of the mA and/or kV according to patient size and/or use of iterative reconstruction technique.  COMPARISON:  11/15/2022  FINDINGS: Lower chest: The lung bases are clear of acute process. No pleural effusion or pulmonary lesions. The heart is normal in size. No pericardial effusion. Stable aortic and coronary artery calcifications. The distal esophagus and aorta are unremarkable.  Hepatobiliary: Small scattered calcified granulomas but no worrisome hepatic lesions or intrahepatic biliary dilatation. The gallbladder is unremarkable. No common bile duct dilatation.  Pancreas: No mass, inflammation or ductal dilatation.  Spleen: Normal size.  No focal lesions.  Adrenals/Urinary Tract: The adrenal glands are normal there is mild left-sided hydroureteronephrosis down to 80 4 mm calculus in the distal left ureter. Small bilateral renal calculi. No right-sided ureteral calculi. No bladder calculi. No obvious renal or bladder lesions without contrast.  Thick wall  bladder is unchanged.  Stomach/Bowel: The stomach, duodenum, small and colon are unremarkable. No acute inflammatory process, mass lesions obstructive findings. There are mild diffuse fibrofatty infiltrative type changes involving the colon suggesting prior inflammatory bowel disease. The terminal ileum and appendix are.  Vascular/Lymphatic: Advanced atherosclerotic calcification involving the aorta and iliac arteries but no aneurysm. Stable 2 cm right common iliac lymph node consistent with known prostate cancer. Stable small left external iliac nodes also.  Reproductive: The prostate gland and seminal vesicles are unremarkable.  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  Musculoskeletal: Stable lytic destructive bone lesions most notably involving T12 and L2.  IMPRESSION: 1. 4 mm distal left ureteral calculus with mild left-sided hydroureteronephrosis. 2. Small bilateral renal calculi. 3. Stable 2 cm right common iliac lymph node consistent with known prostate cancer. 4. Stable lytic destructive bone lesions most notably involving T12 and L2. 5. Stable thick wall bladder. 6. Aortic atherosclerosis.  Aortic Atherosclerosis (ICD10-I70.0).   Electronically Signed By: P.  Gallerani M.D. On: 01/10/2023 17:07   Assessment & Plan:    1. Kidney stones --We discussed the management of kidney stones. These options include observation, ureteroscopy, shockwave lithotripsy (ESWL) and percutaneous nephrolithotomy (PCNL). We discussed which options are relevant to the patient's stone(s). We discussed the natural history of kidney stones as well as the complications of untreated stones and the impact on quality of life without treatment as well as with each of the above listed treatments. We also discussed the efficacy of each treatment in its ability to clear the stone burden. With any of these management options I discussed the signs and symptoms of  infection and the need for emergent treatment should these be experienced. For each option we discussed the ability of each procedure to clear the patient of their stone burden.   For observation I described the risks which include but are not limited to silent renal damage, life-threatening infection, need for emergent surgery, failure to pass stone and pain.   For ureteroscopy I described the risks which include bleeding, infection, damage to contiguous structures, positioning injury, ureteral stricture, ureteral avulsion, ureteral injury, need for prolonged ureteral stent, inability to perform ureteroscopy, need for an interval procedure, inability to clear stone burden, stent discomfort/pain, heart attack, stroke, pulmonary embolus and the inherent risks with general anesthesia.   For shockwave lithotripsy I described the risks which include   arrhythmia, kidney contusion, kidney hemorrhage, need for transfusion, pain, inability to adequately break up stone, inability to pass stone fragments, Steinstrasse, infection associated with obstructing stones, need for alternate surgical procedure, need for repeat shockwave lithotripsy, MI, CVA, PE and the inherent risks with anesthesia/conscious sedation.   For PCNL I described the risks including positioning injury, pneumothorax, hydrothorax, need for chest tube, inability to clear stone burden, renal laceration, arterial venous fistula or malformation, need for embolization of kidney, loss of kidney or renal function, need for repeat procedure, need for prolonged nephrostomy tube, ureteral avulsion, MI, CVA, PE and the inherent risks of general anesthesia.   - The patient would like to proceed with left ureteroscopic stone extraction - Urinalysis, Routine w reflex microscopic  2. Prostate cancer (HCC) -firmagon 80mg today -PSA today   No follow-ups on file.  Braya Habermehl, MD  [facility]   

## 2023-01-12 NOTE — Patient Instructions (Signed)
Ureteroscopy  Ureteroscopy is a procedure to check for and treat problems inside part of the urinary tract. In this procedure, a long rigid or flexible tube with a lens and light at the end (ureteroscope) is used to look at the inside of the kidneys and the ureters. The ureters are the tubes that carry urine from the kidneys to the bladder. The ureteroscope is inserted into one or both of the ureters. You may need this procedure if you have frequent urinary tract infections (UTIs), blood in your urine, or a stone in one or both of your ureters. A ureteroscopy can be done: To find the cause of urine blockage in a ureter and to evaluate other abnormalities inside the ureters or kidneys. To remove stones. To remove or treat growths of tissue (polyps), abnormal tissue, and some types of tumors. To remove a tissue sample and check it for disease under a microscope (biopsy). Tell a health care provider about: Any allergies you have. All medicines you are taking, including vitamins, herbs, eye drops, creams, and over-the-counter medicines. Any problems you or family members have had with anesthetic medicines. Any bleeding problems you have. Any surgeries you have had. Any medical conditions you have. Whether you are pregnant or may be pregnant. What are the risks? Your health care provider will talk with you about risks. These may include: Abdominal pain or a burning feeling or pain while urinating. Abnormal bleeding. A UTI. Allergic reactions to medicines. Scarring that narrows the ureter (stricture) or swelling. Creating a hole (perforation) in the ureter. Damage to other structures or organs, such as the part of your body that drains urine from your bladder (urethra), your bladder, or your uterus. What happens before the procedure? When to stop eating and drinking  8 hours before your procedure Stop eating most foods. Do not eat meat, fried foods, or fatty foods. Eat only light foods, such  as toast or crackers. All liquids are okay except energy drinks and alcohol. 6 hours before your procedure Stop eating. Drink only clear liquids, such as water, clear fruit juice, black coffee, plain tea, and sports drinks. Do not drink energy drinks or alcohol. 2 hours before your procedure Stop drinking all liquids. You may be allowed to take medicines with small sips of water. Medicines Ask your health care provider about: Changing or stopping your regular medicines. These include any diabetes medicines or blood thinners you take. Taking medicines such as aspirin and ibuprofen. These medicines can thin your blood. Do not take these medicines unless your health care provider tells you to. Taking over-the-counter medicines, vitamins, herbs, and supplements. General instructions Do not use any products that contain nicotine or tobacco for at least 4 weeks before the procedure. These products include cigarettes, chewing tobacco, and vaping devices, such as e-cigarettes. If you need help quitting, ask your health care provider. If you will be going home right after the procedure, plan to have a responsible adult: Take you home from the hospital or clinic. You will not be allowed to drive. Care for you for the time you are told. Ask your health care provider what steps will be taken to help prevent infection. These may include: Washing skin with a soap that kills germs. Receiving antibiotic medicine. Tests You may have an exam or testing. You may have a urine sample taken to check for infection. What happens during the procedure? An IV will be inserted into one of your veins. You may be given: A sedative. This helps  you relax. Anesthesia. This will: Numb certain areas of your body. Make you fall asleep for surgery. Your urethra will be cleaned with a germ-killing solution. The ureteroscope will be passed through your urethra into your bladder. A salt-water solution will be sent through  the ureteroscope to fill your bladder. This will help the health care provider see the openings of your ureters more clearly. The ureteroscope will be passed into your ureter. If a growth is found, a biopsy may be done. If a stone is found, it may be removed through the ureteroscope, or the stone may be broken up using a laser, shock waves, or electrical energy. In some cases, if the ureter is too small, a tube may be inserted that keeps the ureter open (ureteral stent). The stent may be left in place for 1 or 2 weeks, and then the ureteroscopy procedure will be done again. The scope will be removed, and your bladder will be emptied. The procedure may vary among health care providers and hospitals. What happens after the procedure? Your blood pressure, heart rate, breathing rate, and blood oxygen level will be monitored until you leave the hospital or clinic. It is up to you to get the results of your procedure. Ask your health care provider, or the department that is doing the procedure, when your results will be ready. Summary Ureteroscopy is a procedure used to look at the inside of the kidneys and the ureters. You may need this procedure if you have frequent urinary tract infections (UTIs), blood in your urine, or a stone in one or both of your ureters. Follow instructions from your health care provider about eating and drinking. In some cases, if the ureter is too small, a tube may be inserted that keeps the ureter open (ureteral stent). The stent may be left in place for 1 or 2 weeks to keep the ureter open, and then the ureteroscopy procedure will be done again. This information is not intended to replace advice given to you by your health care provider. Make sure you discuss any questions you have with your health care provider. Document Revised: 02/24/2022 Document Reviewed: 02/24/2022 Elsevier Patient Education  [place].

## 2023-01-12 NOTE — Progress Notes (Signed)
01/12/2023 9:56 AM   Tony Powell 12-14-1950 [id number]  Referring provider: Coral Spikes, DO [place],  [place] [address]  Followup nephrolithiasis and prostate cancer   HPI: Tony Powell is a 72yo here for followup for nephrolithiasis and prostate cancer. He has not passed his calculus. CT from 2/28 shows a persistent 58m left distal ureteral calculus. He has sharp, intermittent, moderate left flank pain. He uses oxycodone prn which improves his left flank pain. He has metastatic prostate cancer and is due for ADT. No recent PSA.    PMH: Past Medical History:  Diagnosis Date   Abnormal stress test    Borderline hypertension    Chest discomfort    Atypical   DJD (degenerative joint disease)    Knees, lumbar sacral spine   Hyperlipidemia    Hypertension    Prostate cancer ([facility]     Surgical History: Past Surgical History:  Procedure Laterality Date   BACK SURGERY     CYSTOSCOPY WITH FULGERATION  04/18/2021   Procedure: CYSTOSCOPY WITH FULGERATION of BLADDER NECK;  Surgeon: MCleon Gustin MD;  Location: [facility];  Service: Urology;;   CYSTOSCOPY WITH INJECTION N/A 04/18/2021   Procedure: CYSTOSCOPY WITH INJECTION- Botox 100 units;  Surgeon: MCleon Gustin MD;  Location: [facility];  Service: Urology;  Laterality: N/A;   TRANSURETHRAL RESECTION OF BLADDER TUMOR N/A 08/25/2020   Procedure: TRANSURETHRAL RESECTION OF BLADDER TUMOR (TURBT);  Surgeon: MCleon Gustin MD;  Location: [facility];  Service: Urology;  Laterality: N/A;    Home Medications:  Allergies as of 01/12/2023       Reactions   Celebrex [celecoxib]    Eyes drooping and messed up skin on hands        Medication List        Accurate as of January 12, 2023  9:56 AM. If you have any questions, ask your nurse or doctor.          alfuzosin 10 MG 24 hr tablet Commonly known as: UROXATRAL Take 10 mg by mouth daily with breakfast.   atorvastatin 40 MG tablet Commonly known as:  LIPITOR Take 40 mg by mouth daily.   cephALEXin 500 MG capsule Commonly known as: KEFLEX Take 500 mg by mouth 2 (two) times daily as needed (burning during urination).   finasteride 5 MG tablet Commonly known as: PROSCAR Take 1 tablet (5 mg total) by mouth daily.   Iron (Ferrous Sulfate) 325 (65 Fe) MG Tabs Take 325 mg by mouth every other day.   megestrol 20 MG tablet Commonly known as: MEGACE TAKE ONE (1) TABLET (20 MG TOTAL) BY MOUTH THREE (3) (THREE) TIMES DAILY AS NEEDED.   multivitamin capsule Take 1 capsule by mouth daily.   oxybutynin 15 MG 24 hr tablet Commonly known as: DITROPAN XL TAKE ONE (1) TABLET (15 MG TOTAL) BY MOUTH DAILY.   oxyCODONE 5 MG immediate release tablet Commonly known as: Roxicodone Take 1 tablet (5 mg total) by mouth every 6 (six) hours as needed for severe pain.   oxyCODONE-acetaminophen 5-325 MG tablet Commonly known as: PERCOCET/ROXICET Take 1 tablet by mouth every 4 (four) hours as needed.   rosuvastatin 20 MG tablet Commonly known as: CRESTOR TAKE ONE TABLET BY MOUTH MOUTH DAILY   sildenafil 100 MG tablet Commonly known as: VIAGRA Take 1 tablet (100 mg total) by mouth daily as needed for erectile dysfunction.   tamsulosin 0.4 MG Caps capsule Commonly known as: Flomax Take  1 capsule (0.4 mg total) by mouth daily.   valsartan 160 MG tablet Commonly known as: DIOVAN Take 1 tablet (160 mg total) by mouth daily.        Allergies:  Allergies  Allergen Reactions   Celebrex [Celecoxib]     Eyes drooping and messed up skin on hands    Family History: Family History  Problem Relation Age of Onset   Heart attack Mother    Breast cancer Mother        Mastectomy   Leukemia Father    Cancer Brother    Cancer Brother    Cancer Brother    Cancer Brother    Cancer Brother    Colon cancer Neg Hx    Prostate cancer Neg Hx    Pancreatic cancer Neg Hx     Social History:  reports that he quit smoking about 24 years ago. His  smoking use included cigarettes. He has a 20.00 pack-year smoking history. He has never used smokeless tobacco. He reports that he does not drink alcohol and does not use drugs.  ROS: All other review of systems were reviewed and are negative except what is noted above in HPI  Physical Exam: BP (!) 173/93   Pulse 83   Constitutional:  Alert and oriented, No acute distress. HEENT: [place] AT, moist mucus membranes.  Trachea midline, no masses. Cardiovascular: No clubbing, cyanosis, or edema. Respiratory: Normal respiratory effort, no increased work of breathing. GI: Abdomen is soft, nontender, nondistended, no abdominal masses GU: No CVA tenderness.  Lymph: No cervical or inguinal lymphadenopathy. Skin: No rashes, bruises or suspicious lesions. Neurologic: Grossly intact, no focal deficits, moving all 4 extremities. Psychiatric: Normal mood and affect.  Laboratory Data: Lab Results  Component Value Date   WBC 6.8 01/10/2023   HGB 12.5 (L) 01/10/2023   HCT 38.8 (L) 01/10/2023   MCV 82.4 01/10/2023   PLT 241 01/10/2023    Lab Results  Component Value Date   CREATININE 1.27 (H) 01/10/2023    No results found for: "PSA"  No results found for: "TESTOSTERONE"  No results found for: "HGBA1C"  Urinalysis    Component Value Date/Time   COLORURINE YELLOW 01/10/2023 1157   APPEARANCEUR HAZY (A) 01/10/2023 1157   APPEARANCEUR Cloudy (A) 11/15/2022 1451   LABSPEC 1.017 01/10/2023 1157   PHURINE 5.0 01/10/2023 1157   GLUCOSEU NEGATIVE 01/10/2023 1157   HGBUR SMALL (A) 01/10/2023 1157   BILIRUBINUR NEGATIVE 01/10/2023 1157   BILIRUBINUR Negative 11/15/2022 1451   KETONESUR NEGATIVE 01/10/2023 1157   PROTEINUR NEGATIVE 01/10/2023 1157   NITRITE NEGATIVE 01/10/2023 1157   LEUKOCYTESUR MODERATE (A) 01/10/2023 1157    Lab Results  Component Value Date   LABMICR See below: 11/15/2022   WBCUA >30 (A) 11/15/2022   LABEPIT 0-10 11/15/2022   MUCUS Present 11/09/2021   BACTERIA RARE  (A) 01/10/2023    Pertinent Imaging: CT 01/10/2023: Images reviewed and discussed with the patient  No results found for this or any previous visit.  No results found for this or any previous visit.  No results found for this or any previous visit.  No results found for this or any previous visit.  No results found for this or any previous visit.  No valid procedures specified. No results found for this or any previous visit.  Results for orders placed during the hospital encounter of 01/10/23  CT Renal Stone Study  Narrative CLINICAL DATA:  Left flank and abdominal pain.  EXAM: CT ABDOMEN  AND PELVIS WITHOUT CONTRAST  TECHNIQUE: Multidetector CT imaging of the abdomen and pelvis was performed following the standard protocol without IV contrast.  RADIATION DOSE REDUCTION: This exam was performed according to the departmental dose-optimization program which includes automated exposure control, adjustment of the mA and/or kV according to patient size and/or use of iterative reconstruction technique.  COMPARISON:  11/15/2022  FINDINGS: Lower chest: The lung bases are clear of acute process. No pleural effusion or pulmonary lesions. The heart is normal in size. No pericardial effusion. Stable aortic and coronary artery calcifications. The distal esophagus and aorta are unremarkable.  Hepatobiliary: Small scattered calcified granulomas but no worrisome hepatic lesions or intrahepatic biliary dilatation. The gallbladder is unremarkable. No common bile duct dilatation.  Pancreas: No mass, inflammation or ductal dilatation.  Spleen: Normal size.  No focal lesions.  Adrenals/Urinary Tract: The adrenal glands are normal there is mild left-sided hydroureteronephrosis down to 80 4 mm calculus in the distal left ureter. Small bilateral renal calculi. No right-sided ureteral calculi. No bladder calculi. No obvious renal or bladder lesions without contrast.  Thick wall  bladder is unchanged.  Stomach/Bowel: The stomach, duodenum, small and colon are unremarkable. No acute inflammatory process, mass lesions obstructive findings. There are mild diffuse fibrofatty infiltrative type changes involving the colon suggesting prior inflammatory bowel disease. The terminal ileum and appendix are.  Vascular/Lymphatic: Advanced atherosclerotic calcification involving the aorta and iliac arteries but no aneurysm. Stable 2 cm right common iliac lymph node consistent with known prostate cancer. Stable small left external iliac nodes also.  Reproductive: The prostate gland and seminal vesicles are unremarkable.  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  Musculoskeletal: Stable lytic destructive bone lesions most notably involving T12 and L2.  IMPRESSION: 1. 4 mm distal left ureteral calculus with mild left-sided hydroureteronephrosis. 2. Small bilateral renal calculi. 3. Stable 2 cm right common iliac lymph node consistent with known prostate cancer. 4. Stable lytic destructive bone lesions most notably involving T12 and L2. 5. Stable thick wall bladder. 6. Aortic atherosclerosis.  Aortic Atherosclerosis (ICD10-I70.0).   Electronically Signed By: Marijo Sanes M.D. On: 01/10/2023 17:07   Assessment & Plan:    1. Kidney stones --We discussed the management of kidney stones. These options include observation, ureteroscopy, shockwave lithotripsy (ESWL) and percutaneous nephrolithotomy (PCNL). We discussed which options are relevant to the patient's stone(s). We discussed the natural history of kidney stones as well as the complications of untreated stones and the impact on quality of life without treatment as well as with each of the above listed treatments. We also discussed the efficacy of each treatment in its ability to clear the stone burden. With any of these management options I discussed the signs and symptoms of  infection and the need for emergent treatment should these be experienced. For each option we discussed the ability of each procedure to clear the patient of their stone burden.   For observation I described the risks which include but are not limited to silent renal damage, life-threatening infection, need for emergent surgery, failure to pass stone and pain.   For ureteroscopy I described the risks which include bleeding, infection, damage to contiguous structures, positioning injury, ureteral stricture, ureteral avulsion, ureteral injury, need for prolonged ureteral stent, inability to perform ureteroscopy, need for an interval procedure, inability to clear stone burden, stent discomfort/pain, heart attack, stroke, pulmonary embolus and the inherent risks with general anesthesia.   For shockwave lithotripsy I described the risks which include  arrhythmia, kidney contusion, kidney hemorrhage, need for transfusion, pain, inability to adequately break up stone, inability to pass stone fragments, Steinstrasse, infection associated with obstructing stones, need for alternate surgical procedure, need for repeat shockwave lithotripsy, MI, CVA, PE and the inherent risks with anesthesia/conscious sedation.   For PCNL I described the risks including positioning injury, pneumothorax, hydrothorax, need for chest tube, inability to clear stone burden, renal laceration, arterial venous fistula or malformation, need for embolization of kidney, loss of kidney or renal function, need for repeat procedure, need for prolonged nephrostomy tube, ureteral avulsion, MI, CVA, PE and the inherent risks of general anesthesia.   - The patient would like to proceed with left ureteroscopic stone extraction - Urinalysis, Routine w reflex microscopic  2. Prostate cancer ([place]) -firmagon '80mg'$  today -PSA today   No follow-ups on file.  [name], MD  [facility] [place]

## 2023-01-15 ENCOUNTER — Telehealth: Payer: Self-pay

## 2023-01-15 NOTE — Telephone Encounter (Signed)
I spoke with Tony Powell. We have discussed possible surgery dates and 02/01/2023 was agreed upon by all parties. Patient given information about surgery date, what to expect pre-operatively and post operatively.    We discussed that a pre-op nurse will be calling to set up the pre-op visit that will take place prior to surgery. Informed patient that our office will communicate any additional care to be provided after surgery.    Patients questions or concerns were discussed during our call. Advised to call our office should there be any additional information, questions or concerns that arise. Patient verbalized understanding.

## 2023-01-15 NOTE — Telephone Encounter (Signed)
Patient called and left a am this morning requesting a call back regarding the possible date and time of surgery.     Thank you

## 2023-01-22 ENCOUNTER — Telehealth: Payer: Self-pay

## 2023-01-22 NOTE — Telephone Encounter (Signed)
Patient called and advised they needed a refill on medication below.   Medication: oxyCODONE (ROXICODONE) 5 MG immediate release tablet    Pharmacy: [facility], [address]    Thank you

## 2023-01-23 ENCOUNTER — Other Ambulatory Visit: Payer: Self-pay | Admitting: Urology

## 2023-01-23 MED ORDER — OXYCODONE HCL 5 MG PO TABS: 5.0000 mg | ORAL_TABLET | Freq: Four times a day (QID) | ORAL | 0 refills | Status: DC | PRN

## 2023-01-29 ENCOUNTER — Encounter (HOSPITAL_COMMUNITY)
Admission: RE | Admit: 2023-01-29 | Discharge: 2023-01-29 | Disposition: A | Discharge status: Home / Self Care | Payer: Medicare Other | Source: Ambulatory Visit | Attending: Urology | Admitting: Urology

## 2023-01-29 ENCOUNTER — Encounter (HOSPITAL_COMMUNITY): Payer: Self-pay

## 2023-01-29 ENCOUNTER — Other Ambulatory Visit: Payer: Self-pay

## 2023-02-01 ENCOUNTER — Ambulatory Visit (HOSPITAL_COMMUNITY): Payer: Medicare Other | Admitting: Anesthesiology

## 2023-02-01 ENCOUNTER — Ambulatory Visit (HOSPITAL_BASED_OUTPATIENT_CLINIC_OR_DEPARTMENT_OTHER): Payer: Medicare Other | Admitting: Anesthesiology

## 2023-02-01 ENCOUNTER — Ambulatory Visit (HOSPITAL_COMMUNITY)
Admission: RE | Admit: 2023-02-01 | Discharge: 2023-02-01 | Disposition: A | Discharge status: Home / Self Care | Payer: Medicare Other | Attending: Urology | Admitting: Urology

## 2023-02-01 ENCOUNTER — Ambulatory Visit (HOSPITAL_COMMUNITY): Payer: Medicare Other

## 2023-02-01 ENCOUNTER — Encounter (HOSPITAL_COMMUNITY)
Admission: RE | Disposition: A | Discharge status: Home / Self Care | Payer: Self-pay | Source: Home / Self Care | Attending: Urology

## 2023-02-01 ENCOUNTER — Encounter (HOSPITAL_COMMUNITY): Payer: Self-pay | Admitting: Urology

## 2023-02-01 DIAGNOSIS — Z87442 Personal history of urinary calculi: Secondary | ICD-10-CM | POA: Insufficient documentation

## 2023-02-01 DIAGNOSIS — Z8546 Personal history of malignant neoplasm of prostate: Secondary | ICD-10-CM | POA: Insufficient documentation

## 2023-02-01 DIAGNOSIS — Z87891 Personal history of nicotine dependence: Secondary | ICD-10-CM | POA: Diagnosis not present

## 2023-02-01 DIAGNOSIS — J449 Chronic obstructive pulmonary disease, unspecified: Secondary | ICD-10-CM | POA: Insufficient documentation

## 2023-02-01 DIAGNOSIS — N201 Calculus of ureter: Secondary | ICD-10-CM

## 2023-02-01 DIAGNOSIS — I10 Essential (primary) hypertension: Secondary | ICD-10-CM | POA: Diagnosis not present

## 2023-02-01 DIAGNOSIS — N132 Hydronephrosis with renal and ureteral calculous obstruction: Secondary | ICD-10-CM | POA: Diagnosis not present

## 2023-02-01 DIAGNOSIS — Z79899 Other long term (current) drug therapy: Secondary | ICD-10-CM | POA: Insufficient documentation

## 2023-02-01 DIAGNOSIS — M1990 Unspecified osteoarthritis, unspecified site: Secondary | ICD-10-CM | POA: Insufficient documentation

## 2023-02-01 HISTORY — PX: CYSTOSCOPY WITH RETROGRADE PYELOGRAM, URETEROSCOPY AND STENT PLACEMENT: SHX5789

## 2023-02-01 HISTORY — PX: STONE EXTRACTION WITH BASKET: SHX5318

## 2023-02-01 SURGERY — CYSTOURETEROSCOPY, WITH RETROGRADE PYELOGRAM AND STENT INSERTION
Anesthesia: General | Site: Ureter | Laterality: Left

## 2023-02-01 MED ORDER — DIATRIZOATE MEGLUMINE 30 % UR SOLN
URETHRAL | Status: AC
  Filled 2023-02-01: qty 100

## 2023-02-01 MED ORDER — LIDOCAINE HCL (PF) 2 % IJ SOLN
INTRAMUSCULAR | Status: AC
  Filled 2023-02-01: qty 5

## 2023-02-01 MED ORDER — FENTANYL CITRATE (PF) 100 MCG/2ML IJ SOLN
INTRAMUSCULAR | Status: AC
  Filled 2023-02-01: qty 2

## 2023-02-01 MED ORDER — PROPOFOL 10 MG/ML IV BOLUS
INTRAVENOUS | Status: DC | PRN
  Administered 2023-02-01: 200 mg via INTRAVENOUS

## 2023-02-01 MED ORDER — LIDOCAINE HCL (CARDIAC) PF 50 MG/5ML IV SOSY
PREFILLED_SYRINGE | INTRAVENOUS | Status: DC | PRN
  Administered 2023-02-01: 80 mg via INTRAVENOUS

## 2023-02-01 MED ORDER — CEFAZOLIN SODIUM-DEXTROSE 2-4 GM/100ML-% IV SOLN
INTRAVENOUS | Status: AC
  Filled 2023-02-01: qty 100

## 2023-02-01 MED ORDER — OXYCODONE-ACETAMINOPHEN 5-325 MG PO TABS
ORAL_TABLET | ORAL | Status: AC
  Filled 2023-02-01: qty 2

## 2023-02-01 MED ORDER — CEFAZOLIN SODIUM-DEXTROSE 2-4 GM/100ML-% IV SOLN
2.0000 g | INTRAVENOUS | Status: AC
  Administered 2023-02-01: 2 g via INTRAVENOUS

## 2023-02-01 MED ORDER — ORAL CARE MOUTH RINSE: 15.0000 mL | Freq: Once | OROMUCOSAL | Status: AC

## 2023-02-01 MED ORDER — LACTATED RINGERS IV SOLN: INTRAVENOUS | Status: DC

## 2023-02-01 MED ORDER — ONDANSETRON HCL 4 MG/2ML IJ SOLN
4.0000 mg | Freq: Once | INTRAMUSCULAR | Status: AC | PRN
  Administered 2023-02-01: 4 mg via INTRAVENOUS
  Filled 2023-02-01: qty 2

## 2023-02-01 MED ORDER — HYDROMORPHONE HCL 1 MG/ML IJ SOLN
INTRAMUSCULAR | Status: AC
  Filled 2023-02-01: qty 0.5

## 2023-02-01 MED ORDER — WATER FOR IRRIGATION, STERILE IR SOLN
Status: DC | PRN
  Administered 2023-02-01: 500 mL

## 2023-02-01 MED ORDER — OXYCODONE HCL 5 MG PO TABS: 5.0000 mg | ORAL_TABLET | ORAL | 0 refills | Status: DC | PRN

## 2023-02-01 MED ORDER — PROPOFOL 10 MG/ML IV BOLUS
INTRAVENOUS | Status: AC
  Filled 2023-02-01: qty 20

## 2023-02-01 MED ORDER — FENTANYL CITRATE (PF) 100 MCG/2ML IJ SOLN
INTRAMUSCULAR | Status: DC | PRN
  Administered 2023-02-01 (×2): 25 ug via INTRAVENOUS
  Administered 2023-02-01: 50 ug via INTRAVENOUS

## 2023-02-01 MED ORDER — HYDROMORPHONE HCL 1 MG/ML IJ SOLN
0.2500 mg | INTRAMUSCULAR | Status: DC | PRN
  Administered 2023-02-01 (×3): 0.5 mg via INTRAVENOUS
  Filled 2023-02-01 (×3): qty 0.5

## 2023-02-01 MED ORDER — DIATRIZOATE MEGLUMINE 30 % UR SOLN
URETHRAL | Status: DC | PRN
  Administered 2023-02-01: 5 mL via URETHRAL

## 2023-02-01 MED ORDER — CHLORHEXIDINE GLUCONATE 0.12 % MT SOLN
15.0000 mL | Freq: Once | OROMUCOSAL | Status: AC
  Administered 2023-02-01: 15 mL via OROMUCOSAL

## 2023-02-01 MED ORDER — HYDROMORPHONE HCL 1 MG/ML IJ SOLN
0.5000 mg | Freq: Once | INTRAMUSCULAR | Status: AC
  Administered 2023-02-01: 0.5 mg via INTRAVENOUS

## 2023-02-01 MED ORDER — OXYCODONE-ACETAMINOPHEN 5-325 MG PO TABS
2.0000 | ORAL_TABLET | ORAL | Status: DC | PRN
  Administered 2023-02-01: 2 via ORAL

## 2023-02-01 MED ORDER — SODIUM CHLORIDE 0.9 % IR SOLN
Status: DC | PRN
  Administered 2023-02-01: 3000 mL

## 2023-02-01 SURGICAL SUPPLY — 22 items
BAG DRAIN URO TABLE W/ADPT NS (BAG) ×3 IMPLANT
BAG DRN 8 ADPR NS SKTRN CSTL (BAG) ×3
BAG HAMPER (MISCELLANEOUS) ×3 IMPLANT
CATH INTERMIT  6FR 70CM (CATHETERS) ×3 IMPLANT
CLOTH BEACON ORANGE TIMEOUT ST (SAFETY) ×3 IMPLANT
EXTRACTOR STONE NITINOL NGAGE (UROLOGICAL SUPPLIES) ×1 IMPLANT
GLOVE BIO SURGEON STRL SZ8 (GLOVE) ×3 IMPLANT
GLOVE BIOGEL PI IND STRL 7.0 (GLOVE) ×6 IMPLANT
GOWN STRL REUS W/TWL LRG LVL3 (GOWN DISPOSABLE) ×3 IMPLANT
GOWN STRL REUS W/TWL XL LVL3 (GOWN DISPOSABLE) ×3 IMPLANT
GUIDEWIRE STR DUAL SENSOR (WIRE) ×3 IMPLANT
GUIDEWIRE STR ZIPWIRE 035X150 (MISCELLANEOUS) ×3 IMPLANT
IV NS IRRIG 3000ML ARTHROMATIC (IV SOLUTION) ×6 IMPLANT
KIT TURNOVER CYSTO (KITS) ×3 IMPLANT
MANIFOLD NEPTUNE II (INSTRUMENTS) ×3 IMPLANT
PACK CYSTO (CUSTOM PROCEDURE TRAY) ×3 IMPLANT
PAD ARMBOARD 7.5X6 YLW CONV (MISCELLANEOUS) ×3 IMPLANT
STENT URET 6FRX26 CONTOUR (STENTS) ×1 IMPLANT
SYR 10ML LL (SYRINGE) ×3 IMPLANT
SYR CONTROL 10ML LL (SYRINGE) ×3 IMPLANT
TOWEL OR 17X26 4PK STRL BLUE (TOWEL DISPOSABLE) ×3 IMPLANT
WATER STERILE IRR 500ML POUR (IV SOLUTION) ×3 IMPLANT

## 2023-02-01 NOTE — Transfer of Care (Signed)
Immediate Anesthesia Transfer of Care Note  Patient: Tony Powell  Procedure(s) Performed: CYSTOSCOPY WITH RETROGRADE PYELOGRAM, URETEROSCOPY AND STENT PLACEMENT (Left: Renal) STONE EXTRACTION WITH BASKET (Left: Ureter)  Patient Location: PACU  Anesthesia Type:General  Level of Consciousness: awake  Airway & Oxygen Therapy: Patient Spontanous Breathing  Post-op Assessment: Report given to RN  Post vital signs: Reviewed and stable  Last Vitals:  Vitals Value Taken Time  BP 175/84 02/01/23 1045  Temp    Pulse 81 02/01/23 1045  Resp 5 02/01/23 1046  SpO2 100 % 02/01/23 1045  Vitals shown include unvalidated device data.  Last Pain:  Vitals:   02/01/23 0933  PainSc: 10-Worst pain ever         Complications: No notable events documented.

## 2023-02-01 NOTE — Anesthesia Preprocedure Evaluation (Signed)
Anesthesia Evaluation  Patient identified by MRN, date of birth, ID band Patient awake    Reviewed: Allergy & Precautions, H&P , NPO status , Patient's Chart, lab work & pertinent test results  Airway Mallampati: II  TM Distance: >3 FB Neck ROM: Full    Dental  (+) Dental Advisory Given, Missing   Pulmonary COPD,  COPD inhaler, former smoker   Pulmonary exam normal breath sounds clear to auscultation       Cardiovascular Exercise Tolerance: Poor hypertension, Pt. on medications Normal cardiovascular exam Rhythm:Regular Rate:Normal     Neuro/Psych negative neurological ROS  negative psych ROS   GI/Hepatic negative GI ROS, Neg liver ROS,,,  Endo/Other  negative endocrine ROS    Renal/GU negative Renal ROS Bladder dysfunction (prostate cancer, bladder umor)      Musculoskeletal  (+) Arthritis , Osteoarthritis,    Abdominal   Peds negative pediatric ROS (+)  Hematology  (+) Blood dyscrasia, anemia   Anesthesia Other Findings   Reproductive/Obstetrics negative OB ROS                             Anesthesia Physical Anesthesia Plan  ASA: 3  Anesthesia Plan: General   Post-op Pain Management: Dilaudid IV   Induction: Intravenous  PONV Risk Score and Plan: 4 or greater and Ondansetron and Dexamethasone  Airway Management Planned: LMA  Additional Equipment:   Intra-op Plan:   Post-operative Plan: Extubation in OR  Informed Consent: I have reviewed the patients History and Physical, chart, labs and discussed the procedure including the risks, benefits and alternatives for the proposed anesthesia with the patient or authorized representative who has indicated his/her understanding and acceptance.     Dental advisory given  Plan Discussed with: CRNA and Surgeon  Anesthesia Plan Comments:         Anesthesia Quick Evaluation

## 2023-02-01 NOTE — Op Note (Signed)
.  Preoperative diagnosis: Left ureteral stone  Postoperative diagnosis: Same  Procedure: 1 cystoscopy 2. Left retrograde pyelography 3.  Intraoperative fluoroscopy, under one hour, with interpretation 4.  Left ureteroscopic stone manipulation with basket extraction 5.  Left 6 x 26 JJ stent placement  Attending: Rosie Fate  Anesthesia: General  Estimated blood loss: None  Drains: Left 6 x 26 JJ ureteral stent with tether  Specimens: stone for analysis  Antibiotics: ancef  Findings: left distal ureteral stone. Mild hydronephrosis. No masses/lesions in the bladder. Ureteral orifices in normal anatomic location.  Indications: Patient is a 72 year old male with a history of left ureteral stone and who has persistent left flank pain.  After discussing treatment options, he decided proceed with left ureteroscopic stone manipulation.  Procedure in detail: The patient was brought to the operating room and a brief timeout was done to ensure correct patient, correct procedure, correct site.  General anesthesia was administered patient was placed in dorsal lithotomy position.  Her genitalia was then prepped and draped in usual sterile fashion.  A rigid 50 French cystoscope was passed in the urethra and the bladder.  Bladder was inspected free masses or lesions.  the ureteral orifices were in the normal orthotopic locations.  a 6 french ureteral catheter was then instilled into the left ureteral orifice.  a gentle retrograde was obtained and findings noted above.  we then placed a zip wire through the ureteral catheter and advanced up to the renal pelvis.  we then removed the cystoscope and cannulated the left ureteral orifice with a semirigid ureteroscope.  We located 2 stones in the distal ureter which were removed with an NGage basket. Once all stones were removed we then placed a 6 x 26 double-j ureteral stent over the original zip wire.  We removed the wire and good coil was noted in the the  renal pelvis under fluoroscopy and the bladder under direct vision .the stone fragments were then removed from the bladder and sent for analysis.   the bladder was then drained and this concluded the procedure which was well tolerated by patient.  Complications: None  Condition: Stable, extubated, transferred to PACU  Plan: Patient is to be discharged home as to follow-up in one week. He is to remove his stent in 72 hours by pulling the tether

## 2023-02-01 NOTE — Interval H&P Note (Signed)
History and Physical Interval Note:  02/01/2023 9:30 AM  Tony Powell  has presented today for surgery, with the diagnosis of left ureteral calculus.  The various methods of treatment have been discussed with the patient and family. After consideration of risks, benefits and other options for treatment, the patient has consented to  Procedure(s): CYSTOSCOPY WITH RETROGRADE PYELOGRAM, URETEROSCOPY AND STENT PLACEMENT (Left) HOLMIUM LASER APPLICATION (Left) as a surgical intervention.  The patient's history has been reviewed, patient examined, no change in status, stable for surgery.  I have reviewed the patient's chart and labs.  Questions were answered to the patient's satisfaction.     [name]

## 2023-02-01 NOTE — Progress Notes (Signed)
Awake. Restless. Continues c/o postop pain and need to void. Dressed with assistance. D/C instructions reviewed with son and pt. Voiced understanding. D/C to home in good condition. Medications in plastic bag given to son.

## 2023-02-01 NOTE — Anesthesia Postprocedure Evaluation (Signed)
Anesthesia Post Note  Patient: THURL KRISTIANSEN  Procedure(s) Performed: CYSTOSCOPY WITH RETROGRADE PYELOGRAM, URETEROSCOPY AND STENT PLACEMENT (Left: Renal) STONE EXTRACTION WITH BASKET (Left: Ureter)  Patient location during evaluation: Phase II Anesthesia Type: General Level of consciousness: awake and alert and oriented Pain management: pain level controlled Vital Signs Assessment: post-procedure vital signs reviewed and stable Respiratory status: spontaneous breathing, nonlabored ventilation and respiratory function stable Cardiovascular status: blood pressure returned to baseline and stable Postop Assessment: no apparent nausea or vomiting Anesthetic complications: no  No notable events documented.   Last Vitals:  Vitals:   02/01/23 1115 02/01/23 1145  BP: (!) 191/88 (!) 180/88  Pulse: 98 94  Resp: 16   Temp:    SpO2: 99% 100%    Last Pain:  Vitals:   02/01/23 1105  PainSc: 10-Worst pain ever                 Hurman Ketelsen C Juston Goheen

## 2023-02-01 NOTE — Anesthesia Procedure Notes (Signed)
Procedure Name: LMA Insertion Date/Time: 02/01/2023 10:11 AM  Performed by: Ollen Bowl, CRNAPre-anesthesia Checklist: Patient identified, Patient being monitored, Emergency Drugs available, Timeout performed and Suction available Patient Re-evaluated:Patient Re-evaluated prior to induction Oxygen Delivery Method: Circle System Utilized Preoxygenation: Pre-oxygenation with 100% oxygen Induction Type: IV induction Ventilation: Mask ventilation without difficulty LMA: LMA inserted LMA Size: 4.0 Number of attempts: 1 Placement Confirmation: positive ETCO2 and breath sounds checked- equal and bilateral

## 2023-02-06 ENCOUNTER — Encounter (INDEPENDENT_AMBULATORY_CARE_PROVIDER_SITE_OTHER): Payer: Self-pay | Admitting: Gastroenterology

## 2023-02-06 ENCOUNTER — Ambulatory Visit (INDEPENDENT_AMBULATORY_CARE_PROVIDER_SITE_OTHER): Payer: Medicare Other | Admitting: Gastroenterology

## 2023-02-06 VITALS — BP 189/99 | HR 76 | Temp 98.4°F | Ht 70.0 in | Wt 240.8 lb

## 2023-02-06 DIAGNOSIS — D509 Iron deficiency anemia, unspecified: Secondary | ICD-10-CM

## 2023-02-06 NOTE — Progress Notes (Addendum)
Referring Provider: Coral Spikes, DO Primary Care Physician:  Coral Spikes, DO Primary GI Physician: Jenetta Downer   Chief Complaint  Patient presents with   Anemia    Follow up on anemia. Patient missed colonoscopy and would like to reschedule. Unsure of what meds he is taking and not sure if he is taking iron.    HPI:   Tony Powell is a 72 y.o. male with past medical history of  HTN, DJD, HLD, prostate cancer, bladder tumor, COPD   Patient presenting today for anemia.  Last seen December 2023, at that time, here for anemia, though unsure why he was here, though he was coming for colonoscopy, endorsed fatigue, no rectal bleeding or melena. Labs around that time 11.4 In november, notably hgb around 9-11 range over the past 2 years. Iron studies in October with TIBC 436, Iron 64, iron sat 15, ferritin 35, folate and B12 WNL. CMP in November WNL as well.   Recommended colonoscopy and EGD, these were scheduled for 2/13 but were cancelled,  Present: Patient states he had recent cystoscopy for kidney stones. He is still having a rough time from this with a lot of flank pain. He had to cancel his EGD/colonoscopy due to other health issues. He is unsure what medications he is taking currently. Denies any rectal bleeding or melena. He reports appetite is good. Weight is stable. No sob, dizziness or syncope.   Last hgb 12.5 on 01/10/23, he is unsure if he is on iron pills or not.   Last Colonoscopy: never  Last Endoscopy: never  Recommendations:    Past Medical History:  Diagnosis Date   Abnormal stress test    Borderline hypertension    Chest discomfort    Atypical   DJD (degenerative joint disease)    Knees, lumbar sacral spine   Hyperlipidemia    Hypertension    Prostate cancer [facility])     Past Surgical History:  Procedure Laterality Date   BACK SURGERY     CYSTOSCOPY WITH FULGERATION  04/18/2021   Procedure: CYSTOSCOPY WITH FULGERATION of BLADDER NECK;  Surgeon: Cleon Gustin, MD;  Location: [facility];  Service: Urology;;   CYSTOSCOPY WITH INJECTION N/A 04/18/2021   Procedure: CYSTOSCOPY WITH INJECTION- Botox 100 units;  Surgeon: Cleon Gustin, MD;  Location: [facility];  Service: Urology;  Laterality: N/A;   TRANSURETHRAL RESECTION OF BLADDER TUMOR N/A 08/25/2020   Procedure: TRANSURETHRAL RESECTION OF BLADDER TUMOR (TURBT);  Surgeon: Cleon Gustin, MD;  Location: [facility];  Service: Urology;  Laterality: N/A;    Current Outpatient Medications  Medication Sig Dispense Refill   alfuzosin (UROXATRAL) 10 MG 24 hr tablet Take 10 mg by mouth daily with breakfast.     atorvastatin (LIPITOR) 40 MG tablet Take 40 mg by mouth daily.     cephALEXin (KEFLEX) 500 MG capsule Take 500 mg by mouth 2 (two) times daily as needed (burning during urination).     finasteride (PROSCAR) 5 MG tablet Take 1 tablet (5 mg total) by mouth daily. 90 tablet 3   Iron, Ferrous Sulfate, 325 (65 Fe) MG TABS Take 325 mg by mouth every other day. 45 tablet 1   megestrol (MEGACE) 20 MG tablet TAKE ONE (1) TABLET (20 MG TOTAL) BY MOUTH THREE (3) (THREE) TIMES DAILY AS NEEDED. 90 tablet 11   Multiple Vitamin (MULTIVITAMIN) capsule Take 1 capsule by mouth daily.     naproxen sodium (ALEVE) 220 MG tablet Take 220 mg  by mouth once.     oxybutynin (DITROPAN XL) 15 MG 24 hr tablet TAKE ONE (1) TABLET (15 MG TOTAL) BY MOUTH DAILY. 90 tablet 3   oxyCODONE (ROXICODONE) 5 MG immediate release tablet Take 1 tablet (5 mg total) by mouth every 4 (four) hours as needed for severe pain. 30 tablet 0   oxyCODONE-acetaminophen (PERCOCET/ROXICET) 5-325 MG tablet Take 1 tablet by mouth every 4 (four) hours as needed. 20 tablet 0   rosuvastatin (CRESTOR) 20 MG tablet TAKE ONE TABLET BY MOUTH MOUTH DAILY 90 tablet 1   sildenafil (VIAGRA) 100 MG tablet Take 1 tablet (100 mg total) by mouth daily as needed for erectile dysfunction. 30 tablet 5   tamsulosin (FLOMAX) 0.4 MG CAPS capsule Take 1 capsule (0.4 mg total)  by mouth daily. 30 capsule 2   valsartan (DIOVAN) 160 MG tablet Take 1 tablet (160 mg total) by mouth daily. 90 tablet 3   No current facility-administered medications for this visit.    Allergies as of 02/06/2023 - Review Complete 02/06/2023  Allergen Reaction Noted   Celebrex [celecoxib]  10/03/2017    Family History  Problem Relation Age of Onset   Heart attack Mother    Breast cancer Mother        Mastectomy   Leukemia Father    Cancer Brother    Cancer Brother    Cancer Brother    Cancer Brother    Cancer Brother    Colon cancer Neg Hx    Prostate cancer Neg Hx    Pancreatic cancer Neg Hx     Social History   Socioeconomic History   Marital status: Divorced    Spouse name: Not on file   Number of children: 2   Years of education: Not on file   Highest education level: Not on file  Occupational History   Occupation: Long Production manager: [employer]  Tobacco Use   Smoking status: Former    Packs/day: 1.00    Years: 20.00    Additional pack years: 0.00    Total pack years: 20.00    Types: Cigarettes    Quit date: 11/13/1998    Years since quitting: 24.2    Passive exposure: Current   Smokeless tobacco: Never  Vaping Use   Vaping Use: Never used  Substance and Sexual Activity   Alcohol use: No   Drug use: Never   Sexual activity: Not Currently  Other Topics Concern   Not on file  Social History Narrative   Divorced   Social Determinants of Health   Financial Resource Strain: Low Risk  (09/14/2022)   Overall Financial Resource Strain (CARDIA)    Difficulty of Paying Living Expenses: Not hard at all  Food Insecurity: No Food Insecurity (09/14/2022)   Hunger Vital Sign    Worried About Running Out of Food in the Last Year: Never true    Ran Out of Food in the Last Year: Never true  Transportation Needs: Not on file  Physical Activity: Inactive (09/14/2022)   Exercise Vital Sign    Days of Exercise per Week: 0 days    Minutes of  Exercise per Session: 0 min  Stress: No Stress Concern Present (09/14/2022)   [place]    Feeling of Stress : Only a little  Social Connections: Socially Isolated (09/14/2022)   Social Connection and Isolation Panel [NHANES]    Frequency of Communication with Friends and Family: Twice  a week    Frequency of Social Gatherings with Friends and Family: Never    Attends Religious Services: 1 to 4 times per year    Active Member of [employer] or Organizations: No    Attends Music therapist: Never    Marital Status: Divorced    Review of systems General: negative for malaise, night sweats, fever, chills, weight loss Neck: Negative for lumps, goiter, pain and significant neck swelling Resp: Negative for cough, wheezing, dyspnea at rest CV: Negative for chest pain, leg swelling, palpitations, orthopnea GI: denies melena, hematochezia, nausea, vomiting, diarrhea, constipation, dysphagia, odyonophagia, early satiety or unintentional weight loss.  MSK: Negative for joint pain or swelling, back pain, and muscle pain. Derm: Negative for itching or rash Psych: Denies depression, anxiety, memory loss, confusion. No homicidal or suicidal ideation.  Heme: Negative for prolonged bleeding, bruising easily, and swollen nodes. Endocrine: Negative for cold or heat intolerance, polyuria, polydipsia and goiter. Neuro: negative for tremor, gait imbalance, syncope and seizures. The remainder of the review of systems is noncontributory.  Physical Exam: BP (!) 198/91 (BP Location: Left Arm, Patient Position: Bed low/side rails up, Cuff Size: Large)   Pulse 77   Temp 98.4 F (36.9 C) (Oral)   Ht 5\' 10"  (1.778 m)   Wt 240 lb 12.8 oz (109.2 kg)   BMI 34.55 kg/m  General:   Alert and oriented. No distress noted. Pleasant and cooperative.  Head:  Normocephalic and atraumatic. Eyes:  Conjuctiva clear without scleral icterus. Mouth:   Oral mucosa pink and moist. Good dentition. No lesions. Heart: Normal rate and rhythm, s1 and s2 heart sounds present.  Lungs: Clear lung sounds in all lobes. Respirations equal and unlabored. Abdomen:  +BS, soft, non-tender and non-distended. No rebound or guarding. No HSM or masses noted. Derm: No palmar erythema or jaundice Msk:  Symmetrical without gross deformities. Normal posture. Extremities:  Without edema. Neurologic:  Alert and  oriented x4 Psych:  Alert and cooperative. Normal mood and affect.  Invalid input(s): "6 MONTHS"   ASSESSMENT: Tony Powell is a 72 y.o. male presenting today for follow up of anemia.   IDA over the past 2 years, requiring blood transfusion in April 2022 with hgb 8 at that time, no previous endoscopic evaluations. No rectal bleeding or melena. Previous iron studies were low normal, though there was suspicion for progression to IDA. Recommended EGD/Colonoscopy at last visit for further evaluation though he was scheduled for these and had to cancel. He has had a lot of health issues recently with kidney stones and is currently in a lot of pain. Encouraged still to proceed with endoscopic evaluation of his anemia. He should continue with daily oral iron. He would like to call us once he is ready to schedule procedures after his other health issues have calmed down some. He was encouraged to make me aware of rectal bleeding, melena, fatigue, shortness of breath, syncope.   PLAN:  Continue oral iron daily 2. Pt to call once ready to schedule EGD/Colonoscopy (made note to have our team reach out in mid April to see if patient is ready to schedule) 3. Will make me aware of rectal bleeding, melena, fatigue, sob, syncope  All questions were answered, patient verbalized understanding and is in agreement with plan as outlined above.    Follow Up: TBD after procedures   Tessica Cupo L. Alver Sorrow, MSN, APRN, AGNP-C Adult-Gerontology Nurse Practitioner [facility]  for GI Diseases  I have reviewed the note and  agree with the APP's assessment as described in this progress note  Maylon Peppers, MD Gastroenterology and Hepatology [facility] Gastroenterology

## 2023-02-06 NOTE — Patient Instructions (Signed)
Please continue to take oral iron pills Please let us know once you are ready to schedule colonoscopy and upper endoscopy for further evaluation of your anemia If you have rectal bleeding, black stools, shortness of breath, severe fatigue or you pass out, please make me aware  Follow up will be determined after your procedures  It was a pleasure to see you today. I want to create trusting relationships with patients and provide genuine, compassionate, and quality care. I truly value your feedback! please be on the lookout for a survey regarding your visit with me today. I appreciate your input about our visit and your time in completing this!    Delyla Sandeen L. Alver Sorrow, MSN, APRN, AGNP-C Adult-Gerontology Nurse Practitioner [facility] Gastroenterology at [facility]

## 2023-02-07 ENCOUNTER — Encounter: Payer: Self-pay | Admitting: Urology

## 2023-02-07 ENCOUNTER — Ambulatory Visit (INDEPENDENT_AMBULATORY_CARE_PROVIDER_SITE_OTHER): Payer: Medicare Other | Admitting: Urology

## 2023-02-07 VITALS — BP 202/101 | HR 67

## 2023-02-07 DIAGNOSIS — C775 Secondary and unspecified malignant neoplasm of intrapelvic lymph nodes: Secondary | ICD-10-CM

## 2023-02-07 DIAGNOSIS — C61 Malignant neoplasm of prostate: Secondary | ICD-10-CM | POA: Diagnosis not present

## 2023-02-07 DIAGNOSIS — N200 Calculus of kidney: Secondary | ICD-10-CM

## 2023-02-07 MED ORDER — CYCLOBENZAPRINE HCL 5 MG PO TABS: 5.0000 mg | ORAL_TABLET | Freq: Three times a day (TID) | ORAL | 0 refills | Status: DC | PRN

## 2023-02-07 MED ORDER — OXYCODONE HCL 5 MG PO TABS: 5.0000 mg | ORAL_TABLET | ORAL | 0 refills | Status: DC | PRN

## 2023-02-07 NOTE — Progress Notes (Unsigned)
02/07/2023 1:27 PM   Tony Powell 02/24/1951 [id number]  Referring provider: Coral Spikes, DO [place],  [place] [address]  Followup nephrolithiasis and prostate cancer   HPI: [name] is a 72yo here for followup for prostate cancer and nephrolithiasis. He underwent left ureteroscopic stone extraction last week. He removed his stent without issue. He continues to have spine pain which is likely related to his metastatic prostate cancer.    PMH: Past Medical History:  Diagnosis Date   Abnormal stress test    Borderline hypertension    Chest discomfort    Atypical   DJD (degenerative joint disease)    Knees, lumbar sacral spine   Hyperlipidemia    Hypertension    Prostate cancer [facility])     Surgical History: Past Surgical History:  Procedure Laterality Date   BACK SURGERY     CYSTOSCOPY WITH FULGERATION  04/18/2021   Procedure: CYSTOSCOPY WITH FULGERATION of BLADDER NECK;  Surgeon: Cleon Gustin, MD;  Location: [facility];  Service: Urology;;   CYSTOSCOPY WITH INJECTION N/A 04/18/2021   Procedure: CYSTOSCOPY WITH INJECTION- Botox 100 units;  Surgeon: Cleon Gustin, MD;  Location: [facility];  Service: Urology;  Laterality: N/A;   TRANSURETHRAL RESECTION OF BLADDER TUMOR N/A 08/25/2020   Procedure: TRANSURETHRAL RESECTION OF BLADDER TUMOR (TURBT);  Surgeon: Cleon Gustin, MD;  Location: [facility];  Service: Urology;  Laterality: N/A;    Home Medications:  Allergies as of 02/07/2023       Reactions   Celebrex [celecoxib]    Eyes drooping and messed up skin on hands        Medication List        Accurate as of February 07, 2023  1:27 PM. If you have any questions, ask your nurse or doctor.          alfuzosin 10 MG 24 hr tablet Commonly known as: UROXATRAL Take 10 mg by mouth daily with breakfast.   atorvastatin 40 MG tablet Commonly known as: LIPITOR Take 40 mg by mouth daily.   cephALEXin 500 MG capsule Commonly known as:  KEFLEX Take 500 mg by mouth 2 (two) times daily as needed (burning during urination).   finasteride 5 MG tablet Commonly known as: PROSCAR Take 1 tablet (5 mg total) by mouth daily.   Iron (Ferrous Sulfate) 325 (65 Fe) MG Tabs Take 325 mg by mouth every other day.   megestrol 20 MG tablet Commonly known as: MEGACE TAKE ONE (1) TABLET (20 MG TOTAL) BY MOUTH THREE (3) (THREE) TIMES DAILY AS NEEDED.   multivitamin capsule Take 1 capsule by mouth daily.   naproxen sodium 220 MG tablet Commonly known as: ALEVE Take 220 mg by mouth once.   oxybutynin 15 MG 24 hr tablet Commonly known as: DITROPAN XL TAKE ONE (1) TABLET (15 MG TOTAL) BY MOUTH DAILY.   oxyCODONE 5 MG immediate release tablet Commonly known as: Roxicodone Take 1 tablet (5 mg total) by mouth every 4 (four) hours as needed for severe pain.   oxyCODONE-acetaminophen 5-325 MG tablet Commonly known as: PERCOCET/ROXICET Take 1 tablet by mouth every 4 (four) hours as needed.   rosuvastatin 20 MG tablet Commonly known as: CRESTOR TAKE ONE TABLET BY MOUTH MOUTH DAILY   sildenafil 100 MG tablet Commonly known as: VIAGRA Take 1 tablet (100 mg total) by mouth daily as needed for erectile dysfunction.   tamsulosin 0.4 MG Caps capsule Commonly known as: Flomax Take 1 capsule (  0.4 mg total) by mouth daily.   valsartan 160 MG tablet Commonly known as: DIOVAN Take 1 tablet (160 mg total) by mouth daily.        Allergies:  Allergies  Allergen Reactions   Celebrex [Celecoxib]     Eyes drooping and messed up skin on hands    Family History: Family History  Problem Relation Age of Onset   Heart attack Mother    Breast cancer Mother        Mastectomy   Leukemia Father    Cancer Brother    Cancer Brother    Cancer Brother    Cancer Brother    Cancer Brother    Colon cancer Neg Hx    Prostate cancer Neg Hx    Pancreatic cancer Neg Hx     Social History:  reports that he quit smoking about 24 years ago. His  smoking use included cigarettes. He has a 20.00 pack-year smoking history. He has been exposed to tobacco smoke. He has never used smokeless tobacco. He reports that he does not drink alcohol and does not use drugs.  ROS: All other review of systems were reviewed and are negative except what is noted above in HPI  Physical Exam: BP (!) 200/83   Pulse 67   Constitutional:  Alert and oriented, No acute distress. HEENT: [place] AT, moist mucus membranes.  Trachea midline, no masses. Cardiovascular: No clubbing, cyanosis, or edema. Respiratory: Normal respiratory effort, no increased work of breathing. GI: Abdomen is soft, nontender, nondistended, no abdominal masses GU: No CVA tenderness.  Lymph: No cervical or inguinal lymphadenopathy. Skin: No rashes, bruises or suspicious lesions. Neurologic: Grossly intact, no focal deficits, moving all 4 extremities. Psychiatric: Normal mood and affect.  Laboratory Data: Lab Results  Component Value Date   WBC 6.8 01/10/2023   HGB 12.5 (L) 01/10/2023   HCT 38.8 (L) 01/10/2023   MCV 82.4 01/10/2023   PLT 241 01/10/2023    Lab Results  Component Value Date   CREATININE 1.27 (H) 01/10/2023    No results found for: "PSA"  No results found for: "TESTOSTERONE"  No results found for: "HGBA1C"  Urinalysis    Component Value Date/Time   COLORURINE YELLOW 01/10/2023 1157   APPEARANCEUR HAZY (A) 01/10/2023 1157   APPEARANCEUR Cloudy (A) 11/15/2022 1451   LABSPEC 1.017 01/10/2023 1157   PHURINE 5.0 01/10/2023 1157   GLUCOSEU NEGATIVE 01/10/2023 1157   HGBUR SMALL (A) 01/10/2023 1157   BILIRUBINUR NEGATIVE 01/10/2023 1157   BILIRUBINUR Negative 11/15/2022 1451   KETONESUR NEGATIVE 01/10/2023 1157   PROTEINUR NEGATIVE 01/10/2023 1157   NITRITE NEGATIVE 01/10/2023 1157   LEUKOCYTESUR MODERATE (A) 01/10/2023 1157    Lab Results  Component Value Date   LABMICR See below: 11/15/2022   WBCUA >30 (A) 11/15/2022   LABEPIT 0-10 11/15/2022    MUCUS Present 11/09/2021   BACTERIA RARE (A) 01/10/2023    Pertinent Imaging:  No results found for this or any previous visit.  No results found for this or any previous visit.  No results found for this or any previous visit.  No results found for this or any previous visit.  No results found for this or any previous visit.  No valid procedures specified. No results found for this or any previous visit.  Results for orders placed during the hospital encounter of 01/10/23  CT Renal Stone Study  Narrative CLINICAL DATA:  Left flank and abdominal pain.  EXAM: CT ABDOMEN AND PELVIS WITHOUT CONTRAST  TECHNIQUE: Multidetector CT imaging of the abdomen and pelvis was performed following the standard protocol without IV contrast.  RADIATION DOSE REDUCTION: This exam was performed according to the departmental dose-optimization program which includes automated exposure control, adjustment of the mA and/or kV according to patient size and/or use of iterative reconstruction technique.  COMPARISON:  11/15/2022  FINDINGS: Lower chest: The lung bases are clear of acute process. No pleural effusion or pulmonary lesions. The heart is normal in size. No pericardial effusion. Stable aortic and coronary artery calcifications. The distal esophagus and aorta are unremarkable.  Hepatobiliary: Small scattered calcified granulomas but no worrisome hepatic lesions or intrahepatic biliary dilatation. The gallbladder is unremarkable. No common bile duct dilatation.  Pancreas: No mass, inflammation or ductal dilatation.  Spleen: Normal size.  No focal lesions.  Adrenals/Urinary Tract: The adrenal glands are normal there is mild left-sided hydroureteronephrosis down to 80 4 mm calculus in the distal left ureter. Small bilateral renal calculi. No right-sided ureteral calculi. No bladder calculi. No obvious renal or bladder lesions without contrast.  Thick wall bladder is  unchanged.  Stomach/Bowel: The stomach, duodenum, small and colon are unremarkable. No acute inflammatory process, mass lesions obstructive findings. There are mild diffuse fibrofatty infiltrative type changes involving the colon suggesting prior inflammatory bowel disease. The terminal ileum and appendix are.  Vascular/Lymphatic: Advanced atherosclerotic calcification involving the aorta and iliac arteries but no aneurysm. Stable 2 cm right common iliac lymph node consistent with known prostate cancer. Stable small left external iliac nodes also.  Reproductive: The prostate gland and seminal vesicles are unremarkable.  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  Musculoskeletal: Stable lytic destructive bone lesions most notably involving T12 and L2.  IMPRESSION: 1. 4 mm distal left ureteral calculus with mild left-sided hydroureteronephrosis. 2. Small bilateral renal calculi. 3. Stable 2 cm right common iliac lymph node consistent with known prostate cancer. 4. Stable lytic destructive bone lesions most notably involving T12 and L2. 5. Stable thick wall bladder. 6. Aortic atherosclerosis.  Aortic Atherosclerosis (ICD10-I70.0).   Electronically Signed By: Marijo Sanes M.D. On: 01/10/2023 17:07   Assessment & Plan:    1. Prostate cancer metastatic to intrapelvic lymph node ([place]) -referral to medical oncology - Urinalysis, Routine w reflex microscopic  2. Nephrolithiasis -followup 6 weeks with renal US   No follow-ups on file.  [name], MD  [facility] Urology [place]

## 2023-02-07 NOTE — Patient Instructions (Signed)

## 2023-02-08 LAB — CALCULI, WITH PHOTOGRAPH (CLINICAL LAB)
Calcium Oxalate Dihydrate: 20 %
Calcium Oxalate Monohydrate: 80 %
Weight Calculi: 10 mg

## 2023-02-09 NOTE — Addendum Note (Signed)
Addended by: Harvel Quale on: 02/09/2023 07:21 PM   Modules accepted: Level of Service

## 2023-02-12 ENCOUNTER — Ambulatory Visit: Payer: Medicare Other

## 2023-02-13 ENCOUNTER — Encounter (HOSPITAL_COMMUNITY): Payer: Self-pay | Admitting: Urology

## 2023-02-14 NOTE — Progress Notes (Signed)
[facility] [address] [place] [address]   Clinic Day:  02/15/2023  Referring physician: Cleon Gustin, MD  Patient Care Team: Coral Spikes, DO as PCP - General (Family Medicine) Derek Jack, MD as Medical Oncologist (Medical Oncology) Brien Mates, RN as Oncology Nurse Navigator (Medical Oncology)   ASSESSMENT & PLAN:   Assessment:  1.  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 Rhunette Croft in [place] restarted on 12/04/2022, second dose 01/12/2023 - PSMA PET (11/30/2022): Radiotracer avid lesions involving left posteromedial ninth rib, T12 and L2 vertebral bodies.  Retroperitoneal and iliac side chain lymph nodes.  2.  Social/family history: -He lives at home with his son Fannie Knee.  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 - Father had cancer, type not known to the patient.  Mother had breast cancer and died in her 73s.  19 brother had prostate cancer and another brother had pancreatic cancer.  1 other brother had cancer, type unknown to the patient.  Plan:  1.  Metastatic CRPC to the bones and lymph nodes: - I have reviewed PET scan images with the patient and his son. - He appears to have a tumor that is producing low PSA levels.  I do not have most recent PSA.  We will send a PSA and testosterone levels today. - I have recommended biopsy of the rib lesion for tissue diagnosis as well as NGS testing. - Recommend germline  mutation testing. - The prostatic adenocarcinoma confirmed on biopsy, discussed triple therapy with ADT, docetaxel and darolutamide based on ARASENS trial. - Recommend port placement. - Recommend that he keep appointment with Dr. Alyson Ingles for [place].  2.  Left back pain radiating to the left groin: - He reports this pain present for the last 3 months, gotten worse in the last 3 weeks. - 02/01/2023: Left 6 x 26 JJ stent placement after left ureteroscopic stone manipulation with basket extraction. - He has been taking oxycodone 5 mg but is not helping. - We have given Toradol 60 mg IM in the office today. - We will send oxycodone 5 mg 2 tablets every 6 hours as needed #112. - Recommend MRI of the thoracic and lumbar spine with and without contrast. - Recommend radiation oncology evaluation for pain control.  3.  Bone health: - I have briefly talked about initiating denosumab to decrease SRE.  I will talk in detail about it at next visit.   Orders Placed This Encounter  Procedures   MR Thoracic Spine W Wo Contrast    Standing Status:   Future    Standing Expiration Date:   02/15/2024    Order Specific Question:   GRA to provide read?    Answer:   Yes    Order Specific Question:   If indicated for the ordered procedure, I authorize the administration of contrast media per Radiology protocol    Answer:   Yes    Order Specific Question:   What is the patient's sedation requirement?    Answer:   No  Sedation    Order Specific Question:   Use SRS Protocol?    Answer:   No    Order Specific Question:   Does the patient have a pacemaker or implanted devices?    Answer:   No    Order Specific Question:   Preferred imaging location?    Answer:   [facility] (table limit [phone])    Order Specific Question:   Release to patient    Answer:   Immediate    Order Specific Question:   Call Results- Best Contact Number?    Answer:   [phone] / do not hold patient   MR Lumbar Spine W Wo  Contrast    Standing Status:   Future    Standing Expiration Date:   02/15/2024    Order Specific Question:   If indicated for the ordered procedure, I authorize the administration of contrast media per Radiology protocol    Answer:   Yes    Order Specific Question:   What is the patient's sedation requirement?    Answer:   No Sedation    Order Specific Question:   Does the patient have a pacemaker or implanted devices?    Answer:   No    Order Specific Question:   Use SRS Protocol?    Answer:   No    Order Specific Question:   Preferred imaging location?    Answer:   [facility] (table limit [phone])    Order Specific Question:   Release to patient    Answer:   Immediate   IR IMAGING GUIDED PORT INSERTION    Standing Status:   Future    Standing Expiration Date:   02/15/2024    Order Specific Question:   Reason for Exam (SYMPTOM  OR DIAGNOSIS REQUIRED)    Answer:   Metastatic Prostate Cancer    Order Specific Question:   Preferred Imaging Location?    Answer:   [facility]    Order Specific Question:   Release to patient    Answer:   Immediate   IR Fluoro Guide Ndl Plmt / BX    Standing Status:   Future    Standing Expiration Date:   02/15/2024    Order Specific Question:   Reason for Exam (SYMPTOM  OR DIAGNOSIS REQUIRED)    Answer:   T12 and L2 Spinal Lesion    Order Specific Question:   Preferred Imaging Location?    Answer:   [facility]    Order Specific Question:   Release to patient    Answer:   Immediate   CBC with Differential    Standing Status:   Future    Number of Occurrences:   1    Standing Expiration Date:   02/15/2024   Comprehensive metabolic panel    Standing Status:   Future    Number of Occurrences:   1    Standing Expiration Date:   02/15/2024   PSA    Standing Status:   Future    Number of Occurrences:   1    Standing Expiration Date:   02/15/2024   Testosterone    Standing Status:   Future    Number of Occurrences:   1    Standing  Expiration Date:   02/15/2024   Ambulatory referral to Genetics    Referral Priority:   Routine    Referral Type:   Consultation    Referral Reason:  Specialty Services Required    Number of Visits Requested:   1      I,Katie Daubenspeck,acting as a scribe for Derek Jack, MD.,have documented all relevant documentation on the behalf of Derek Jack, MD,as directed by  Derek Jack, MD while in the presence of Derek Jack, MD.   I, Derek Jack MD, have reviewed the above documentation for accuracy and completeness, and I agree with the above.   Derek Jack, MD   4/4/20246:01 PM  CHIEF COMPLAINT/PURPOSE OF CONSULT:   Diagnosis: metastatic prostate cancer   Cancer Staging  Prostate cancer Staging form: Prostate, AJCC 8th Edition - Clinical stage from 08/25/2020: Stage IVB (cT1c, cN1, cM1c, PSA: 5.4, Grade Group: 5) - Unsigned    Prior Therapy: XRT to prostate/pelvic nodes 11/16/20 - 01/14/21  Current Therapy:  ADT   HISTORY OF PRESENT ILLNESS:   Oncology History   No history exists.      Tony Powell is a 72 y.o. male presenting to clinic today for evaluation of metastatic prostate cancer at the request of Dr. Alyson Ingles.  He was initially referred to urology in 05/2020 for worsening LUTS. He was prescribed finasteride for BPH in late 06/2020. He developed gross hematuria with associated dysuria and pelvic pain, and an in-office cystoscopy on 08/23/20 showed a 2 cm bladder tumor and a 1 cm intra-vesicle prostatic protrusion. He proceeded to TURBT on 08/25/20, and pathology revealed Gleason 4+5 prostatic adenocarcinoma. A PSA obtained a week later was 2.7, which would be 5.4 adjusted for finasteride. Staging CT A/P performed 09/22/20 showed bilateral pelvic adenopathy and nonspecific subpleural pulmonary nodules. He was started on ADT with firmagon on 09/30/20. He received radiation therapy to the prostate and pelvic nodes under Dr. Rhunette Croft in  [place], [place] from 11/16/20 through 01/14/21.   More recently, he was seen in the ED on 10/06/22 for flank pain. CT A/P renal stone protocol showed: 2 mm stone in distal left ureter; new enlarged right common iliac artery lymph node and new lytic lesion of posterior body of T12. Follow up CT on 11/15/22 showed: passage of previously seen distal ureteral calculus; new 4 mm left distal ureteral calculus, with additional calculus vs vascular calcification in left renal hilum; slight enlargement of right common iliac lymph node; further vertebral body destruction at T12 with early extension into anterior central canal. These findings prompted a PSMA PET scan on 11/30/22 showing: radiotracer-avid retroperitoneal and iliac side chain lymph nodes; radiotracer-avid osseous metastatic disease involving posteromedial 9th rib, T12 vertebral body, and L2 vertebral body.  He continues on firmagon, with his most recent injection on 02/01/23, as well as finasteride. His most recent PSA from 05/04/22 was 1 (2 adjusted for finasteride).   He continued to struggle with painful kidney stones and ultimately underwent extraction and stent placement on 02/01/23 with Dr. Alyson Ingles. He is also working with GI to be rescheduled for colonoscopy/EGD to evaluate anemia.  Today, he states that he is doing well overall. His appetite level is at 50%. His energy level is at 40%.  PAST MEDICAL HISTORY:   Past Medical History: Past Medical History:  Diagnosis Date   Abnormal stress test    Borderline hypertension    Chest discomfort    Atypical   DJD (degenerative joint disease)    Knees, lumbar sacral spine   Hyperlipidemia    Hypertension    Prostate cancer     Surgical History: Past Surgical History:  Procedure Laterality Date   BACK SURGERY     CYSTOSCOPY  WITH FULGERATION  04/18/2021   Procedure: CYSTOSCOPY WITH FULGERATION of BLADDER NECK;  Surgeon: Cleon Gustin, MD;  Location: [facility];  Service: Urology;;   CYSTOSCOPY  WITH INJECTION N/A 04/18/2021   Procedure: CYSTOSCOPY WITH INJECTION- Botox 100 units;  Surgeon: Cleon Gustin, MD;  Location: [facility];  Service: Urology;  Laterality: N/A;   CYSTOSCOPY WITH RETROGRADE PYELOGRAM, URETEROSCOPY AND STENT PLACEMENT Left 02/01/2023   Procedure: CYSTOSCOPY WITH RETROGRADE PYELOGRAM, URETEROSCOPY AND STENT PLACEMENT;  Surgeon: Cleon Gustin, MD;  Location: [facility];  Service: Urology;  Laterality: Left;   STONE EXTRACTION WITH BASKET Left 02/01/2023   Procedure: STONE EXTRACTION WITH BASKET;  Surgeon: Cleon Gustin, MD;  Location: [facility];  Service: Urology;  Laterality: Left;   TRANSURETHRAL RESECTION OF BLADDER TUMOR N/A 08/25/2020   Procedure: TRANSURETHRAL RESECTION OF BLADDER TUMOR (TURBT);  Surgeon: Cleon Gustin, MD;  Location: [facility];  Service: Urology;  Laterality: N/A;    Social History: Social History   Socioeconomic History   Marital status: Divorced    Spouse name: Not on file   Number of children: 2   Years of education: Not on file   Highest education level: Not on file  Occupational History   Occupation: Long distance Therapist, sports: [employer]  Tobacco Use   Smoking status: Former    Packs/day: 1.00    Years: 20.00    Additional pack years: 0.00    Total pack years: 20.00    Types: Cigarettes    Quit date: 11/13/1998    Years since quitting: 24.2    Passive exposure: Current   Smokeless tobacco: Never  Vaping Use   Vaping Use: Never used  Substance and Sexual Activity   Alcohol use: No   Drug use: Never   Sexual activity: Not Currently  Other Topics Concern   Not on file  Social History Narrative   Divorced   Social Determinants of Health   Financial Resource Strain: Low Risk  (09/14/2022)   Overall Financial Resource Strain (CARDIA)    Difficulty of Paying Living Expenses: Not hard at all  Food Insecurity: No Food Insecurity (02/15/2023)   Hunger Vital Sign    Worried About Running Out of Food in  the Last Year: Never true    [place] in the Last Year: Never true  Transportation Needs: No Transportation Needs (02/15/2023)   PRAPARE - Hydrologist (Medical): No    Lack of Transportation (Non-Medical): No  Physical Activity: Inactive (09/14/2022)   Exercise Vital Sign    Days of Exercise per Week: 0 days    Minutes of Exercise per Session: 0 min  Stress: No Stress Concern Present (09/14/2022)   [place]    Feeling of Stress : Only a little  Social Connections: Socially Isolated (09/14/2022)   Social Connection and Isolation Panel [NHANES]    Frequency of Communication with Friends and Family: Twice a week    Frequency of Social Gatherings with Friends and Family: Never    Attends Religious Services: 1 to 4 times per year    Active Member of [employer] or Organizations: No    Attends Archivist Meetings: Never    Marital Status: Divorced  Human resources officer Violence: Not At Risk (02/15/2023)   Humiliation, Afraid, Rape, and Kick questionnaire    Fear of Current or Ex-Partner: No    Emotionally Abused: No  Physically Abused: No    Sexually Abused: No    Family History: Family History  Problem Relation Age of Onset   Heart attack Mother    Breast cancer Mother        Mastectomy   Leukemia Father    Cancer Brother    Cancer Brother    Cancer Brother    Cancer Brother    Cancer Brother    Colon cancer Neg Hx    Prostate cancer Neg Hx    Pancreatic cancer Neg Hx     Current Medications:  Current Outpatient Medications:    alfuzosin (UROXATRAL) 10 MG 24 hr tablet, Take 10 mg by mouth daily with breakfast., Disp: , Rfl:    atorvastatin (LIPITOR) 40 MG tablet, Take 40 mg by mouth daily., Disp: , Rfl:    cephALEXin (KEFLEX) 500 MG capsule, Take 500 mg by mouth 2 (two) times daily as needed (burning during urination)., Disp: , Rfl:    cyclobenzaprine (FLEXERIL) 5 MG  tablet, Take 1 tablet (5 mg total) by mouth 3 (three) times daily as needed for muscle spasms., Disp: 30 tablet, Rfl: 0   darolutamide (NUBEQA) 300 MG tablet, Take 2 tablets (600 mg total) by mouth 2 (two) times daily with a meal., Disp: 120 tablet, Rfl: 3   finasteride (PROSCAR) 5 MG tablet, Take 1 tablet (5 mg total) by mouth daily., Disp: 90 tablet, Rfl: 3   Iron, Ferrous Sulfate, 325 (65 Fe) MG TABS, Take 325 mg by mouth every other day., Disp: 45 tablet, Rfl: 1   megestrol (MEGACE) 20 MG tablet, TAKE ONE (1) TABLET (20 MG TOTAL) BY MOUTH THREE (3) (THREE) TIMES DAILY AS NEEDED., Disp: 90 tablet, Rfl: 11   Multiple Vitamin (MULTIVITAMIN) capsule, Take 1 capsule by mouth daily., Disp: , Rfl:    naproxen sodium (ALEVE) 220 MG tablet, Take 220 mg by mouth once., Disp: , Rfl:    oxybutynin (DITROPAN XL) 15 MG 24 hr tablet, TAKE ONE (1) TABLET (15 MG TOTAL) BY MOUTH DAILY., Disp: 90 tablet, Rfl: 3   oxyCODONE-acetaminophen (PERCOCET/ROXICET) 5-325 MG tablet, Take 1 tablet by mouth every 4 (four) hours as needed., Disp: 20 tablet, Rfl: 0   rosuvastatin (CRESTOR) 20 MG tablet, TAKE ONE TABLET BY MOUTH MOUTH DAILY, Disp: 90 tablet, Rfl: 1   sildenafil (VIAGRA) 100 MG tablet, Take 1 tablet (100 mg total) by mouth daily as needed for erectile dysfunction., Disp: 30 tablet, Rfl: 5   tamsulosin (FLOMAX) 0.4 MG CAPS capsule, Take 1 capsule (0.4 mg total) by mouth daily., Disp: 30 capsule, Rfl: 2   valsartan (DIOVAN) 160 MG tablet, Take 1 tablet (160 mg total) by mouth daily., Disp: 90 tablet, Rfl: 3   oxyCODONE (ROXICODONE) 5 MG immediate release tablet, Take 2 tablets (10 mg total) by mouth every 6 (six) hours as needed for severe pain., Disp: 112 tablet, Rfl: 0   Allergies: Allergies  Allergen Reactions   Celebrex [Celecoxib]     Eyes drooping and messed up skin on hands    REVIEW OF SYSTEMS:   Review of Systems  Constitutional:  Negative for chills, fatigue and fever.  HENT:   Negative for  lump/mass, mouth sores, nosebleeds, sore throat and trouble swallowing.   Eyes:  Negative for eye problems.  Respiratory:  Negative for cough and shortness of breath.   Cardiovascular:  Negative for chest pain, leg swelling and palpitations.  Gastrointestinal:  Positive for nausea. Negative for abdominal pain, constipation, diarrhea and vomiting.  Genitourinary:  Negative for bladder incontinence, difficulty urinating, dysuria, frequency, hematuria and nocturia.   Musculoskeletal:  Negative for arthralgias, back pain, flank pain, myalgias and neck pain.       Left groin and testicular pain  Skin:  Negative for itching and rash.  Neurological:  Negative for dizziness, headaches and numbness.  Hematological:  Does not bruise/bleed easily.  Psychiatric/Behavioral:  Positive for sleep disturbance. Negative for depression and suicidal ideas. The patient is not nervous/anxious.   All other systems reviewed and are negative.    VITALS:   Blood pressure (!) 178/97, pulse 84, temperature 99.3 F (37.4 C), temperature source Oral, resp. rate 18, SpO2 100 %.  Wt Readings from Last 3 Encounters:  02/06/23 240 lb 12.8 oz (109.2 kg)  01/29/23 230 lb (104.3 kg)  01/10/23 230 lb (104.3 kg)    There is no height or weight on file to calculate BMI.  Performance status (ECOG): 1 - Symptomatic but completely ambulatory  PHYSICAL EXAM:   Physical Exam Vitals and nursing note reviewed. Exam conducted with a chaperone present.  Constitutional:      Appearance: Normal appearance.  Cardiovascular:     Rate and Rhythm: Normal rate and regular rhythm.     Pulses: Normal pulses.     Heart sounds: Normal heart sounds.  Pulmonary:     Effort: Pulmonary effort is normal.     Breath sounds: Normal breath sounds.  Abdominal:     Palpations: Abdomen is soft. There is no hepatomegaly, splenomegaly or mass.     Tenderness: There is no abdominal tenderness.  Musculoskeletal:     Right lower leg: No edema.      Left lower leg: No edema.  Lymphadenopathy:     Cervical: No cervical adenopathy.     Right cervical: No superficial, deep or posterior cervical adenopathy.    Left cervical: No superficial, deep or posterior cervical adenopathy.     Upper Body:     Right upper body: No supraclavicular or axillary adenopathy.     Left upper body: No supraclavicular or axillary adenopathy.  Neurological:     General: No focal deficit present.     Mental Status: He is alert and oriented to person, place, and time.  Psychiatric:        Mood and Affect: Mood normal.        Behavior: Behavior normal.     LABS:      Latest Ref Rng & Units 02/15/2023    2:42 PM 01/10/2023   12:24 PM 01/01/2023    2:46 PM  CBC  WBC 4.0 - 10.5 K/uL 6.9  6.8  6.6   Hemoglobin 13.0 - 17.0 g/dL 13.1  12.5  12.7   Hematocrit 39.0 - 52.0 % 40.2  38.8  39.6   Platelets 150 - 400 K/uL 231  241  215       Latest Ref Rng & Units 02/15/2023    2:42 PM 01/10/2023   12:24 PM 01/01/2023    2:46 PM  CMP  Glucose 70 - 99 mg/dL 107  118  110   BUN 8 - 23 mg/dL 8  6  8    Creatinine 0.61 - 1.24 mg/dL 1.18  1.27  1.21   Sodium 135 - 145 mmol/L 137  136  139   Potassium 3.5 - 5.1 mmol/L 3.2  3.5  3.7   Chloride 98 - 111 mmol/L 106  107  106   CO2 22 - 32 mmol/L 22  20  23   Calcium 8.9 - 10.3 mg/dL 9.1  9.1  9.3   Total Protein 6.5 - 8.1 g/dL 7.9  7.0  7.6   Total Bilirubin 0.3 - 1.2 mg/dL 1.0  1.0  1.2   Alkaline Phos 38 - 126 U/L 89  74  75   AST 15 - 41 U/L 22  29  20    ALT 0 - 44 U/L 20  25  21       No results found for: "CEA1", "CEA" / No results found for: "CEA1", "CEA" Lab Results  Component Value Date   PSA1 1.0 05/04/2022   No results found for: "[id number]" No results found for: "CAN125"  No results found for: "TOTALPROTELP", "ALBUMINELP", "A1GS", "A2GS", "BETS", "BETA2SER", "GAMS", "MSPIKE", "SPEI" Lab Results  Component Value Date   TIBC 436 08/17/2022   FERRITIN 35 08/17/2022   IRONPCTSAT 15 08/17/2022   No  results found for: "LDH"   STUDIES:   DG C-Arm 1-60 Min-No Report  Result Date: 02/01/2023 Fluoroscopy was utilized by the requesting physician.  No radiographic interpretation.

## 2023-02-15 ENCOUNTER — Encounter (HOSPITAL_COMMUNITY): Payer: Self-pay | Admitting: Neuroradiology

## 2023-02-15 ENCOUNTER — Other Ambulatory Visit: Payer: Self-pay

## 2023-02-15 ENCOUNTER — Other Ambulatory Visit: Payer: Medicare Other

## 2023-02-15 ENCOUNTER — Encounter: Payer: Self-pay | Admitting: Hematology

## 2023-02-15 ENCOUNTER — Inpatient Hospital Stay: Payer: Medicare Other | Attending: Hematology | Admitting: Hematology

## 2023-02-15 ENCOUNTER — Ambulatory Visit: Payer: Medicare Other

## 2023-02-15 ENCOUNTER — Ambulatory Visit (HOSPITAL_COMMUNITY)
Admission: RE | Admit: 2023-02-15 | Discharge: 2023-02-15 | Disposition: A | Discharge status: Home / Self Care | Payer: Medicare Other | Source: Ambulatory Visit | Attending: Urology | Admitting: Urology

## 2023-02-15 ENCOUNTER — Other Ambulatory Visit (HOSPITAL_COMMUNITY): Payer: Self-pay

## 2023-02-15 VITALS — BP 178/97 | HR 84 | Temp 99.3°F | Resp 18

## 2023-02-15 DIAGNOSIS — Z886 Allergy status to analgesic agent status: Secondary | ICD-10-CM | POA: Insufficient documentation

## 2023-02-15 DIAGNOSIS — Z79899 Other long term (current) drug therapy: Secondary | ICD-10-CM | POA: Diagnosis not present

## 2023-02-15 DIAGNOSIS — Z800 Family history of malignant neoplasm of digestive organs: Secondary | ICD-10-CM | POA: Diagnosis not present

## 2023-02-15 DIAGNOSIS — R110 Nausea: Secondary | ICD-10-CM | POA: Insufficient documentation

## 2023-02-15 DIAGNOSIS — I10 Essential (primary) hypertension: Secondary | ICD-10-CM | POA: Insufficient documentation

## 2023-02-15 DIAGNOSIS — C61 Malignant neoplasm of prostate: Secondary | ICD-10-CM | POA: Diagnosis present

## 2023-02-15 DIAGNOSIS — C778 Secondary and unspecified malignant neoplasm of lymph nodes of multiple regions: Secondary | ICD-10-CM | POA: Insufficient documentation

## 2023-02-15 DIAGNOSIS — C7951 Secondary malignant neoplasm of bone: Secondary | ICD-10-CM | POA: Diagnosis not present

## 2023-02-15 DIAGNOSIS — Z809 Family history of malignant neoplasm, unspecified: Secondary | ICD-10-CM | POA: Insufficient documentation

## 2023-02-15 DIAGNOSIS — Z5111 Encounter for antineoplastic chemotherapy: Secondary | ICD-10-CM | POA: Diagnosis present

## 2023-02-15 DIAGNOSIS — M549 Dorsalgia, unspecified: Secondary | ICD-10-CM | POA: Diagnosis not present

## 2023-02-15 DIAGNOSIS — N200 Calculus of kidney: Secondary | ICD-10-CM | POA: Diagnosis not present

## 2023-02-15 DIAGNOSIS — Z5189 Encounter for other specified aftercare: Secondary | ICD-10-CM | POA: Insufficient documentation

## 2023-02-15 DIAGNOSIS — Z8249 Family history of ischemic heart disease and other diseases of the circulatory system: Secondary | ICD-10-CM | POA: Diagnosis not present

## 2023-02-15 DIAGNOSIS — G479 Sleep disorder, unspecified: Secondary | ICD-10-CM | POA: Diagnosis not present

## 2023-02-15 DIAGNOSIS — E785 Hyperlipidemia, unspecified: Secondary | ICD-10-CM | POA: Diagnosis not present

## 2023-02-15 DIAGNOSIS — Z807 Family history of other malignant neoplasms of lymphoid, hematopoietic and related tissues: Secondary | ICD-10-CM | POA: Diagnosis not present

## 2023-02-15 DIAGNOSIS — Z803 Family history of malignant neoplasm of breast: Secondary | ICD-10-CM | POA: Diagnosis not present

## 2023-02-15 DIAGNOSIS — Z79633 Long term (current) use of mitotic inhibitor: Secondary | ICD-10-CM | POA: Insufficient documentation

## 2023-02-15 DIAGNOSIS — Z8042 Family history of malignant neoplasm of prostate: Secondary | ICD-10-CM | POA: Diagnosis not present

## 2023-02-15 DIAGNOSIS — Z87891 Personal history of nicotine dependence: Secondary | ICD-10-CM | POA: Insufficient documentation

## 2023-02-15 LAB — CBC WITH DIFFERENTIAL/PLATELET
Abs Immature Granulocytes: 0.02 10*3/uL (ref 0.00–0.07)
Basophils Absolute: 0 10*3/uL (ref 0.0–0.1)
Basophils Relative: 0 %
Eosinophils Absolute: 0.1 10*3/uL (ref 0.0–0.5)
Eosinophils Relative: 1 %
HCT: 40.2 % (ref 39.0–52.0)
Hemoglobin: 13.1 g/dL (ref 13.0–17.0)
Immature Granulocytes: 0 %
Lymphocytes Relative: 15 %
Lymphs Abs: 1 10*3/uL (ref 0.7–4.0)
MCH: 27 pg (ref 26.0–34.0)
MCHC: 32.6 g/dL (ref 30.0–36.0)
MCV: 82.9 fL (ref 80.0–100.0)
Monocytes Absolute: 0.4 10*3/uL (ref 0.1–1.0)
Monocytes Relative: 6 %
Neutro Abs: 5.3 10*3/uL (ref 1.7–7.7)
Neutrophils Relative %: 78 %
Platelets: 231 10*3/uL (ref 150–400)
RBC: 4.85 MIL/uL (ref 4.22–5.81)
RDW: 14.9 % (ref 11.5–15.5)
WBC: 6.9 10*3/uL (ref 4.0–10.5)
nRBC: 0 % (ref 0.0–0.2)

## 2023-02-15 LAB — COMPREHENSIVE METABOLIC PANEL
ALT: 20 U/L (ref 0–44)
AST: 22 U/L (ref 15–41)
Albumin: 4.2 g/dL (ref 3.5–5.0)
Alkaline Phosphatase: 89 U/L (ref 38–126)
Anion gap: 9 (ref 5–15)
BUN: 8 mg/dL (ref 8–23)
CO2: 22 mmol/L (ref 22–32)
Calcium: 9.1 mg/dL (ref 8.9–10.3)
Chloride: 106 mmol/L (ref 98–111)
Creatinine, Ser: 1.18 mg/dL (ref 0.61–1.24)
GFR, Estimated: >60 mL/min (ref >60)
Glucose, Bld: 107 mg/dL — ABNORMAL HIGH (ref 70–99)
Potassium: 3.2 mmol/L — ABNORMAL LOW (ref 3.5–5.1)
Sodium: 137 mmol/L (ref 135–145)
Total Bilirubin: 1 mg/dL (ref 0.3–1.2)
Total Protein: 7.9 g/dL (ref 6.5–8.1)

## 2023-02-15 LAB — PSA: Prostatic Specific Antigen: 3.61 ng/mL (ref 0.00–4.00)

## 2023-02-15 MED ORDER — KETOROLAC TROMETHAMINE 60 MG/2ML IM SOLN
60.0000 mg | Freq: Once | INTRAMUSCULAR | Status: AC
  Administered 2023-02-15: 60 mg via INTRAMUSCULAR
  Filled 2023-02-15: qty 2

## 2023-02-15 MED ORDER — OXYCODONE HCL 5 MG PO TABS: 10.0000 mg | ORAL_TABLET | Freq: Four times a day (QID) | ORAL | 0 refills | Status: DC | PRN

## 2023-02-15 MED ORDER — NUBEQA 300 MG PO TABS
600.0000 mg | ORAL_TABLET | Freq: Two times a day (BID) | ORAL | 3 refills | Status: DC
  Filled 2023-02-15 – 2023-02-20 (×2): qty 120, 30d supply, fill #0

## 2023-02-15 NOTE — Progress Notes (Signed)
START ON PATHWAY REGIMEN - Prostate     Darolutamide: A cycle is every 28 days:     Darolutamide    Docetaxel cycles 1 through 6: A cycle is every 21 days:     Docetaxel   **Always confirm dose/schedule in your pharmacy ordering system**  Patient Characteristics: Adenocarcinoma, Recurrent/New Systemic Disease (Including Biochemical Recurrence), Non-Castrate, M1 Histology: Adenocarcinoma Therapeutic Status: Recurrent/New Systemic Disease (Including Biochemical Recurrence) Intent of Therapy: Non-Curative / Palliative Intent, Discussed with Patient 

## 2023-02-15 NOTE — Patient Instructions (Addendum)
[facility]  Discharge Instructions  You were seen and examined today by Dr. Delton Coombes. Dr. Delton Coombes is a medical oncologist, meaning that he specializes in the treatment of cancer diagnoses. Dr. Delton Coombes discussed your past medical history, family history of cancers, and the events that led to you being here today.  You were referred to Dr. Delton Coombes for ongoing management of your prostate cancer.  You have Stage IV Prostate Cancer. The prostate cancer has spread to your lymph nodes and bones. Your back pain is likely related to the prostate cancer. Dr. Delton Coombes will refer you to Radiation Oncology to discuss palliative radiation therapy to relieve your pain.  Dr. Delton Coombes has recommended continuing Firmagon injections with your urologist. Dr. Delton Coombes has recommended adding to the Firmagon injections in order to better control the cancer.  Prior to that, Dr. Delton Coombes is going to order a biopsy and MRI of your spine.  You will be referred for Genetic Counseling.  Follow-up as scheduled.  Thank you for choosing [facility] to provide your oncology and hematology care.   To afford each patient quality time with our provider, please arrive at least 15 minutes before your scheduled appointment time. You may need to reschedule your appointment if you arrive late (10 or more minutes). Arriving late affects you and other patients whose appointments are after yours.  Also, if you miss three or more appointments without notifying the office, you may be dismissed from the clinic at the provider's discretion.    Again, thank you for choosing [facility].  Our hope is that these requests will decrease the amount of time that you wait before being seen by our physicians.   If you have a lab appointment with the [facility] - please note that after April 8th, all labs will be drawn in the cancer center.  You do not have to  check in or register with the main entrance as you have in the past but will complete your check-in at the cancer center.            _____________________________________________________________  Should you have questions after your visit to [facility], please contact our office at [phone] and follow the prompts.  Our office hours are 8:00 a.m. to 4:30 p.m. Monday - Thursday and 8:00 a.m. to 2:30 p.m. Friday.  Please note that voicemails left after 4:00 p.m. may not be returned until the following business day.  We are closed weekends and all major holidays.  You do have access to a nurse 24-7, just call the main number to the clinic [phone] and do not press any options, hold on the line and a nurse will answer the phone.    For prescription refill requests, have your pharmacy contact our office and allow 72 hours.    Masks are no longer required in the cancer centers. If you would like for your care team to wear a mask while they are taking care of you, please let them know. You may have one support person who is at least 72 years old accompany you for your appointments.

## 2023-02-16 ENCOUNTER — Other Ambulatory Visit: Payer: Self-pay | Admitting: Internal Medicine

## 2023-02-16 ENCOUNTER — Encounter: Payer: Self-pay | Admitting: General Practice

## 2023-02-16 ENCOUNTER — Other Ambulatory Visit (HOSPITAL_COMMUNITY): Payer: Self-pay

## 2023-02-16 ENCOUNTER — Other Ambulatory Visit: Payer: Self-pay

## 2023-02-16 ENCOUNTER — Ambulatory Visit (HOSPITAL_COMMUNITY)
Admission: RE | Admit: 2023-02-16 | Discharge: 2023-02-16 | Disposition: A | Discharge status: Home / Self Care | Payer: Medicare Other | Source: Ambulatory Visit | Attending: Hematology | Admitting: Hematology

## 2023-02-16 ENCOUNTER — Telehealth: Payer: Self-pay | Admitting: Licensed Clinical Social Worker

## 2023-02-16 ENCOUNTER — Telehealth: Payer: Self-pay

## 2023-02-16 DIAGNOSIS — C61 Malignant neoplasm of prostate: Secondary | ICD-10-CM | POA: Diagnosis present

## 2023-02-16 LAB — TESTOSTERONE: Testosterone: 7 ng/dL — ABNORMAL LOW (ref 264–916)

## 2023-02-16 MED ORDER — GADOBUTROL 1 MMOL/ML IV SOLN
10.0000 mL | Freq: Once | INTRAVENOUS | Status: AC | PRN
  Administered 2023-02-16: 10 mL via INTRAVENOUS

## 2023-02-16 NOTE — Progress Notes (Signed)
Patient for IR Port Placement on Mon 02/19/2023, I called and spoke with the patient's son, Tony Powell on the phone and gave pre-procedure instructions. Tony Powell was made aware to Have the pt here at 12:30p, NPO after MN prior to procedure as well as driver post procedure/recovery/discharge. Tony Powell stated understanding.  Called 02/16/2023

## 2023-02-16 NOTE — Telephone Encounter (Signed)
[facility] Multi-Cancer+RNA panel ordered 02/16/2023.  Lacy Duverney, MS, [facility] Genetic Counselor [email or web address] Phone: [phone]

## 2023-02-16 NOTE — Progress Notes (Signed)
Irish Lack, MD  Caroleen Hamman, NT Please send to [facility] and spine IR docs for review.       Previous Messages    ----- Message ----- From: Caroleen Hamman, NT Sent: 02/15/2023   2:41 PM EDT To: [facility] Procedure Requests Subject: CT Biopsy                                      Procedure: STAT CT Biopsy  Reason: T12 and L2 Spinal Lesion Dx: Prostate cancer  History: NM in chart  Procedure:  Doreatha Massed, MD  Contact:  [phone]

## 2023-02-16 NOTE — Telephone Encounter (Signed)
Oral Oncology Patient Advocate Encounter  New authorization   Received notification that prior authorization for Nubeqa is required.   PA submitted on 02/16/2023  Key [id number]  Status is pending     Ardeen Fillers, CPhT Oncology Pharmacy Patient Advocate  [facility]  [phone] (phone) [phone] (fax) 02/16/2023 8:13 AM

## 2023-02-16 NOTE — Telephone Encounter (Signed)
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      Ardeen Fillers, CPhT Oncology Pharmacy Patient Advocate  [facility]  [phone] (phone) [phone] (fax) 02/16/2023 9:41 AM

## 2023-02-19 ENCOUNTER — Other Ambulatory Visit (HOSPITAL_COMMUNITY): Payer: Self-pay

## 2023-02-19 ENCOUNTER — Telehealth (HOSPITAL_COMMUNITY): Payer: Self-pay | Admitting: Pharmacist

## 2023-02-19 ENCOUNTER — Ambulatory Visit
Admission: RE | Admit: 2023-02-19 | Discharge: 2023-02-19 | Disposition: A | Discharge status: Home / Self Care | Payer: Medicare Other | Source: Ambulatory Visit | Attending: Hematology | Admitting: Hematology

## 2023-02-19 DIAGNOSIS — Z87891 Personal history of nicotine dependence: Secondary | ICD-10-CM | POA: Insufficient documentation

## 2023-02-19 DIAGNOSIS — C7951 Secondary malignant neoplasm of bone: Secondary | ICD-10-CM | POA: Insufficient documentation

## 2023-02-19 DIAGNOSIS — C61 Malignant neoplasm of prostate: Secondary | ICD-10-CM | POA: Diagnosis present

## 2023-02-19 DIAGNOSIS — I10 Essential (primary) hypertension: Secondary | ICD-10-CM | POA: Insufficient documentation

## 2023-02-19 HISTORY — PX: IR IMAGING GUIDED PORT INSERTION: IMG5740

## 2023-02-19 MED ORDER — MIDAZOLAM HCL 5 MG/5ML IJ SOLN
INTRAMUSCULAR | Status: AC | PRN
  Administered 2023-02-19 (×2): .5 mg via INTRAVENOUS

## 2023-02-19 MED ORDER — HEPARIN SOD (PORK) LOCK FLUSH 100 UNIT/ML IV SOLN
500.0000 [IU] | Freq: Once | INTRAVENOUS | Status: AC
  Administered 2023-02-19: 500 [IU] via INTRAVENOUS

## 2023-02-19 MED ORDER — SODIUM CHLORIDE 0.9 % IV SOLN: INTRAVENOUS | Status: DC

## 2023-02-19 MED ORDER — LIDOCAINE HCL (PF) 1 % IJ SOLN
13.0000 mL | Freq: Once | INTRAMUSCULAR | Status: AC
  Administered 2023-02-19: 13 mL

## 2023-02-19 MED ORDER — FENTANYL CITRATE (PF) 100 MCG/2ML IJ SOLN
INTRAMUSCULAR | Status: AC
  Filled 2023-02-19: qty 2

## 2023-02-19 MED ORDER — MIDAZOLAM HCL 2 MG/2ML IJ SOLN
INTRAMUSCULAR | Status: AC
  Filled 2023-02-19: qty 2

## 2023-02-19 MED ORDER — HEPARIN SOD (PORK) LOCK FLUSH 100 UNIT/ML IV SOLN
INTRAVENOUS | Status: AC
  Filled 2023-02-19: qty 5

## 2023-02-19 MED ORDER — LIDOCAINE HCL 1 % IJ SOLN
INTRAMUSCULAR | Status: AC
  Filled 2023-02-19: qty 20

## 2023-02-19 MED ORDER — FENTANYL CITRATE (PF) 100 MCG/2ML IJ SOLN
INTRAMUSCULAR | Status: AC | PRN
  Administered 2023-02-19 (×2): 25 ug via INTRAVENOUS
  Administered 2023-02-19: 50 ug via INTRAVENOUS

## 2023-02-19 MED ORDER — MIDAZOLAM HCL 2 MG/2ML IJ SOLN
INTRAMUSCULAR | Status: AC | PRN
  Administered 2023-02-19: 1 mg via INTRAVENOUS

## 2023-02-19 NOTE — Progress Notes (Signed)
Pt still eating and drinking, son called for pick up in approx 15 min

## 2023-02-19 NOTE — H&P (Signed)
Chief Complaint: Patient was seen in consultation today for port placement  Referring Physician(s): Katragadda,Sreedhar  Supervising Physician: Irish Lack  Patient Status: [facility] - Out-pt  History of Present Illness: Tony Powell is a 72 y.o. male with PMH significant for hypertension being seen today for image-guided port placement secondary to metastatic prostate cancer. The patient is followed by Dr Chelsea Aus from Oncology who has referred the patient to IR for image-guided port placement.   Past Medical History:  Diagnosis Date   Abnormal stress test    Borderline hypertension    Chest discomfort    Atypical   DJD (degenerative joint disease)    Knees, lumbar sacral spine   Hyperlipidemia    Hypertension    Prostate cancer     Past Surgical History:  Procedure Laterality Date   BACK SURGERY     CYSTOSCOPY WITH FULGERATION  04/18/2021   Procedure: CYSTOSCOPY WITH FULGERATION of BLADDER NECK;  Surgeon: Malen Gauze, MD;  Location: [facility];  Service: Urology;;   CYSTOSCOPY WITH INJECTION N/A 04/18/2021   Procedure: CYSTOSCOPY WITH INJECTION- Botox 100 units;  Surgeon: Malen Gauze, MD;  Location: [facility];  Service: Urology;  Laterality: N/A;   CYSTOSCOPY WITH RETROGRADE PYELOGRAM, URETEROSCOPY AND STENT PLACEMENT Left 02/01/2023   Procedure: CYSTOSCOPY WITH RETROGRADE PYELOGRAM, URETEROSCOPY AND STENT PLACEMENT;  Surgeon: Malen Gauze, MD;  Location: [facility];  Service: Urology;  Laterality: Left;   STONE EXTRACTION WITH BASKET Left 02/01/2023   Procedure: STONE EXTRACTION WITH BASKET;  Surgeon: Malen Gauze, MD;  Location: [facility];  Service: Urology;  Laterality: Left;   TRANSURETHRAL RESECTION OF BLADDER TUMOR N/A 08/25/2020   Procedure: TRANSURETHRAL RESECTION OF BLADDER TUMOR (TURBT);  Surgeon: Malen Gauze, MD;  Location: [facility];  Service: Urology;  Laterality: N/A;    Allergies: Celebrex [celecoxib]  Medications: Prior to Admission  medications   Medication Sig Start Date End Date Taking? Authorizing Provider  alfuzosin (UROXATRAL) 10 MG 24 hr tablet Take 10 mg by mouth daily with breakfast.    [provider]  atorvastatin (LIPITOR) 40 MG tablet Take 40 mg by mouth daily. 01/02/23   [provider]  cephALEXin (KEFLEX) 500 MG capsule Take 500 mg by mouth 2 (two) times daily as needed (burning during urination).    [provider]  cyclobenzaprine (FLEXERIL) 5 MG tablet Take 1 tablet (5 mg total) by mouth 3 (three) times daily as needed for muscle spasms. 02/07/23   McKenzie, Mardene Celeste, MD  darolutamide (NUBEQA) 300 MG tablet Take 2 tablets (600 mg total) by mouth 2 (two) times daily with a meal. 02/15/23   Doreatha Massed, MD  finasteride (PROSCAR) 5 MG tablet Take 1 tablet (5 mg total) by mouth daily. 05/12/22   McKenzie, Mardene Celeste, MD  Iron, Ferrous Sulfate, 325 (65 Fe) MG TABS Take 325 mg by mouth every other day. 08/20/22   Tommie Sams, DO  megestrol (MEGACE) 20 MG tablet TAKE ONE (1) TABLET (20 MG TOTAL) BY MOUTH THREE (3) (THREE) TIMES DAILY AS NEEDED. 12/25/22   McKenzie, Mardene Celeste, MD  Multiple Vitamin (MULTIVITAMIN) capsule Take 1 capsule by mouth daily.    [provider]  naproxen sodium (ALEVE) 220 MG tablet Take 220 mg by mouth once.    [provider]  oxybutynin (DITROPAN XL) 15 MG 24 hr tablet TAKE ONE (1) TABLET (15 MG TOTAL) BY MOUTH DAILY. 12/05/22   McKenzie, Mardene Celeste, MD  oxyCODONE (ROXICODONE) 5 MG  immediate release tablet Take 2 tablets (10 mg total) by mouth every 6 (six) hours as needed for severe pain. 02/15/23   Doreatha MassedKatragadda, Sreedhar, MD  oxyCODONE-acetaminophen (PERCOCET/ROXICET) 5-325 MG tablet Take 1 tablet by mouth every 4 (four) hours as needed. 01/01/23   Burgess AmorIdol, Julie, PA-C  rosuvastatin (CRESTOR) 20 MG tablet TAKE ONE TABLET BY MOUTH MOUTH DAILY 12/25/22   Tommie Samsook, Jayce G, DO  sildenafil (VIAGRA) 100 MG tablet Take 1 tablet (100 mg total) by mouth daily as  needed for erectile dysfunction. 08/25/22   Tommie Samsook, Jayce G, DO  tamsulosin (FLOMAX) 0.4 MG CAPS capsule Take 1 capsule (0.4 mg total) by mouth daily. 10/27/22   McKenzie, Mardene CelestePatrick L, MD  valsartan (DIOVAN) 160 MG tablet Take 1 tablet (160 mg total) by mouth daily. 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    Employer: [employer]  Tobacco Use   Smoking status: Former    Packs/day: 1.00    Years: 20.00    Additional pack years: 0.00    Total pack years: 20.00    Types: Cigarettes    Quit date: 11/13/1998    Years since quitting: 24.2    Passive exposure: Current   Smokeless tobacco: Never  Vaping Use   Vaping Use: Never used  Substance and Sexual Activity   Alcohol use: No   Drug use: Never   Sexual activity: Not Currently  Other Topics Concern   Not on file  Social History Narrative   Divorced   Social Determinants of Health   Financial Resource Strain: Low Risk  (09/14/2022)   Overall Financial Resource Strain (CARDIA)    Difficulty of Paying Living Expenses: Not hard at all  Food Insecurity: No Food Insecurity (02/15/2023)   Hunger Vital Sign    Worried About Running Out of Food in the Last Year: Never true    Ran Out of Food in the Last Year: Never true  Transportation Needs: No Transportation Needs (02/15/2023)   PRAPARE - Administrator, Civil ServiceTransportation    Lack of Transportation (Medical): No    Lack of Transportation (Non-Medical): No   Physical Activity: Inactive (09/14/2022)   Exercise Vital Sign    Days of Exercise per Week: 0 days    Minutes of Exercise per Session: 0 min  Stress: No Stress Concern Present (09/14/2022)   [employer] - Occupational Stress Questionnaire    Feeling of Stress : Only a little  Social Connections: Socially Isolated (09/14/2022)   Social Connection and Isolation Panel [NHANES]    Frequency of Communication with Friends and Family: Twice a week    Frequency of Social Gatherings with Friends and Family: Never    Attends Religious Services: 1 to 4 times  per year    Active Member of Clubs or Organizations: No    Attends Banker Meetings: Never    Marital Status: Divorced    Code Status:  Review of Systems: A 12 point ROS discussed and pertinent positives are indicated in the HPI above.  All other systems are negative.  Review of Systems  Constitutional:  Negative for chills and fever.  Respiratory:  Negative for chest tightness and shortness of breath.   Cardiovascular:  Negative for chest pain and leg swelling.  Gastrointestinal:  Positive for constipation. Negative for abdominal pain, diarrhea, nausea and vomiting.  Neurological:  Negative for dizziness and headaches.  Psychiatric/Behavioral:  Negative for confusion.     Vital Signs: Pulse 76   Temp 98.3 F (36.8 C) (Oral)   Resp 20   Ht  (1.778 m)   Wt 230 lb (104.3 kg)   SpO2 99%   BMI 33.00 kg/m      Physical Exam Vitals reviewed.  Constitutional:      General: He is not in acute distress.    Appearance: He is not ill-appearing.  Cardiovascular:     Rate and Rhythm: Normal rate and regular rhythm.     Pulses: Normal pulses.     Heart sounds: Normal heart sounds.  Pulmonary:     Effort: Pulmonary effort is normal.     Breath sounds: Normal breath sounds.  Abdominal:     General: Bowel sounds are normal.     Palpations: Abdomen is soft.     Tenderness: There is no  abdominal tenderness.  Musculoskeletal:     Right lower leg: No edema.     Left lower leg: No edema.  Skin:    General: Skin is warm and dry.  Neurological:     Mental Status: He is alert and oriented to person, place, and time.  Psychiatric:        Mood and Affect: Mood normal.        Behavior: Behavior normal.     Imaging: Ultrasound renal complete  Result Date: 02/18/2023 CLINICAL DATA:  Nephrolithiasis EXAM: RENAL / URINARY TRACT ULTRASOUND COMPLETE COMPARISON:  None Available. FINDINGS: Right Kidney: Length: 10.8 cm. Echogenicity within normal limits. No mass or hydronephrosis visualized. Left Kidney: Length: 9.7 cm. Echogenicity within normal limits. No mass or hydronephrosis visualized. Bladder: Appears normal for degree of bladder distention. IMPRESSION: Unremarkable examination. Electronically Signed   By: Layla Maw M.D.   On: 02/18/2023 16:44   DG C-Arm 1-60 Min-No Report  Result Date: 02/01/2023 Fluoroscopy was utilized by the requesting physician.  No radiographic interpretation.    Labs:  CBC: Recent Labs    10/06/22 1157 01/01/23 1446 01/10/23 1224 02/15/23 1442  WBC 5.5 6.6 6.8 6.9  HGB 11.4* 12.7* 12.5* 13.1  HCT 35.5* 39.6 38.8* 40.2  PLT 218 215 241 231    COAGS: No results for input(s): "INR", "APTT" in the last 8760 hours.  BMP: Recent Labs    10/06/22 1157 01/01/23 1446 01/10/23 1224 02/15/23 1442  NA 142 139 136 137  K 3.7 3.7 3.5 3.2*  CL 110 106 107 106  CO2 25 23 20* 22  GLUCOSE 97 110* 118* 107*  BUN 6* 8 6* 8  CALCIUM 9.0 9.3 9.1 9.1  CREATININE 1.11 1.21 1.27* 1.18  GFRNONAA >60 >60 >60 >60    LIVER FUNCTION TESTS: Recent Labs    10/06/22 1157 01/01/23 1446 01/10/23 1224 02/15/23 1442  BILITOT 0.7 1.2 1.0 1.0  AST 17 20 29 22   ALT 11 21 25 20   ALKPHOS 70 75 74 89  PROT 6.8 7.6 7.0 7.9  ALBUMIN 3.9 4.2 4.1 4.2    TUMOR MARKERS: No results for input(s): "AFPTM", "CEA", "CA199", "CHROMGRNA" in the last 8760  hours.  Assessment and Plan:  Nirmal Soltau is a 72 yo male being seen today for image-guided port placement to facilitate chemotherapy for metastatic prostate cancer. Patient presents today in his usual state of health and is NPO. Case has been reviewed by Dr Fredia Sorrow and is set to proceed on 02/19/23.   Risks and benefits of image guided port-a-catheter placement was discussed with the patient including, but not limited to bleeding, infection, pneumothorax, or fibrin sheath development and need for additional procedures.  All of the patient's questions were answered, patient is agreeable to proceed. Consent signed and in chart.    Thank you for this interesting consult.  I greatly enjoyed meeting Tony Powell and look forward to participating in their care.  A copy of this report was sent to the requesting provider on this date.  Electronically Signed: Kennieth Francois, PA-C 02/19/2023, 11:10 AM   I spent a total of  30 Minutes   in face to face in clinical consultation, greater than 50% of which was counseling/coordinating care for port placement.

## 2023-02-19 NOTE — Telephone Encounter (Signed)
Appeal faxed to [facility] on 02/19/23

## 2023-02-19 NOTE — Progress Notes (Signed)
Patient clinically stable post Port placement per Dr Fredia Sorrow, tolerated well. Vitals stable pre and post procedure. Denies complaints post procedure. Received Versed 2 mg along with Fentanyl 100 mcg IV for procedure. Report given to Berdine Dance Rn post procedure/332

## 2023-02-19 NOTE — Telephone Encounter (Addendum)
Oral Oncology Pharmacist Encounter  Received new prescription for Nubeqa (darolutamide) for the treatment of metastatic castration sensitive prostate cancer in conjunction with docetaxel and ADT, planned duration until disease progression or unacceptable drug toxicity.  CMP from 02/15/23 assessed, no relevant lab abnormalities. Prescription dose and frequency assessed.   Current medication list in Epic reviewed, a few DDIs with darolutamide identified: Clarification needed for whether patient is on atorvastatin or rosuvastatin Atorvastatin: Darolutamide may increase the serum concentration of atorvastatin. Monitor patients for increased toxicities and effects of atovastatin.  Rosuvastatin: Darolutamide may increase the serum concentration of Rosuvastatin. Limit the dose of rosuvastatin to 5 mg daily when combined with darolutamide. Monitor closely for increased rosuvastatin effects/toxicities (eg, myalgias, rhabdomyolysis) when these agents are combined. Valsartan: Darolutamide may increase the serum concentration of valsartan. Monitor patients for increased toxicities and effects of valsartan.   Evaluated chart and no patient barriers to medication adherence identified.   Prescription has been e-scribed to the [facility] for benefits analysis and approval.  Oral Oncology Clinic will continue to follow for insurance authorization, copayment issues, initial counseling and start date.  Remi Haggard, PharmD, BCPS, BCOP, CPP Hematology/Oncology Clinical Pharmacist Practitioner [place]/[facility] Oral Chemotherapy Navigation Clinic [phone]  02/19/2023 9:25 AM

## 2023-02-19 NOTE — Procedures (Signed)
Interventional Radiology Procedure Note  Procedure: Single Lumen Power Port Placement    Access:  Right IJ vein.  Findings: Catheter tip positioned at SVC/RA junction. Port is ready for immediate use.   Complications: None  EBL: < 10 mL  Recommendations:  - Ok to shower in 24 hours - Do not submerge for 7 days - Routine line care   Marisela Line T. Kierria Feigenbaum, M.D Pager:  [phone]   

## 2023-02-20 ENCOUNTER — Other Ambulatory Visit (HOSPITAL_COMMUNITY): Payer: Self-pay

## 2023-02-20 ENCOUNTER — Other Ambulatory Visit: Payer: Self-pay | Admitting: Pharmacist

## 2023-02-20 ENCOUNTER — Other Ambulatory Visit: Payer: Self-pay

## 2023-02-20 ENCOUNTER — Ambulatory Visit (INDEPENDENT_AMBULATORY_CARE_PROVIDER_SITE_OTHER): Payer: Medicare Other | Admitting: Urology

## 2023-02-20 DIAGNOSIS — E785 Hyperlipidemia, unspecified: Secondary | ICD-10-CM

## 2023-02-20 DIAGNOSIS — C61 Malignant neoplasm of prostate: Secondary | ICD-10-CM | POA: Diagnosis not present

## 2023-02-20 DIAGNOSIS — C775 Secondary and unspecified malignant neoplasm of intrapelvic lymph nodes: Secondary | ICD-10-CM

## 2023-02-20 MED ORDER — DEGARELIX ACETATE 80 MG ~~LOC~~ SOLR
80.0000 mg | Freq: Once | SUBCUTANEOUS | Status: AC
Start: 2023-02-20 — End: 2023-02-20
  Administered 2023-02-20: 80 mg via SUBCUTANEOUS

## 2023-02-20 MED ORDER — ROSUVASTATIN CALCIUM 5 MG PO TABS
5.0000 mg | ORAL_TABLET | Freq: Every day | ORAL | 1 refills | Status: DC
Start: 2023-02-20 — End: 2024-06-27

## 2023-02-20 NOTE — Telephone Encounter (Signed)
Oral Chemotherapy Pharmacist Encounter  Patient Education I spoke with patient and his son [name] for overview of new oral chemotherapy medication: Nubeqa (darolutamide) for the treatment of metastatic castration sensitive prostate cancer in conjunction with docetaxel and ADT, planned duration until disease progression or unacceptable drug toxicity.  Counseled patient on administration, dosing, side effects, monitoring, drug-food interactions, safe handling, storage, and disposal. Patient will take 2 tablets (600 mg total) by mouth 2 (two) times daily with a meal.   Side effects include but not limited to: changes in liver and decreased wbc.    Reviewed with patient importance of keeping a medication schedule and plan for any missed doses.  After discussion with patient one patient barriers to medication adherence identified. Patient appears to have general confusion around his medications, what is is taking and why. Clarified as much as I could over the phone.   Patient was able to confirm that he is not taking atorvastatin this medication was removed from his medication list. He reports taking rosuvastatin, but I asked him to hold taking for now due to drug interaction with darolutamide. Will reach out to his prescription provider Dr. Adriana Simas to discuss a rosuvastatin dose decrease.    Mr. Samberg voiced understanding and appreciation. All questions answered. Medication handout provided.  Provided patient with Oral Chemotherapy Navigation Clinic phone number. Patient knows to call the office with questions or concerns. Oral Chemotherapy Navigation Clinic will continue to follow.  Remi Haggard, PharmD, BCPS, BCOP, CPP Hematology/Oncology Clinical Pharmacist Practitioner [place]/[facility] Oral Chemotherapy Navigation [facility] [phone]  02/20/2023 1:13 PM

## 2023-02-20 NOTE — Progress Notes (Signed)
The following biosimilar Stimufend (pegfilgrastim-fpgk) has been selected for use in this patient.   Pryor Ochoa, PharmD

## 2023-02-20 NOTE — Progress Notes (Addendum)
Firmagon Sub Q Injection  Due to Prostate Cancer patient is present today for a Firmagon Injection.   Medication: Deborra Medina (Degarelix)  Dose: 80mg  Location: right upper abdomen Lot: [id number] Exp: 04/2025  Patient tolerated well, no complications were noted  Performed by: Guss Bunde, CMA  Follow up: Follow up with MD for next injection.

## 2023-02-20 NOTE — Telephone Encounter (Signed)
Patient successfully OnBo[employer] and drug education provided by pharmacist. Tony Powell scheduled to be shipped on 02/21/23 for delivery to their home on 02/22/23 from [facility]. Patient knows to call me at [phone] with any questions or concerns related to receiving Nubeqa or in regards to co-payment changes.    Ardeen Fillers, CPhT Oncology Pharmacy Patient Advocate  [facility]  [phone] (phone) [phone] (fax) 02/20/2023 1:24 PM

## 2023-02-20 NOTE — Telephone Encounter (Signed)
Oral Oncology Patient Advocate Encounter   An urgent appeal for the prior authorization denial of Nubeqa has been started by the pharmacist on 02/19/23.   This encounter will continue to be updated until final appeal determination.      Ardeen Fillers, CPhT Oncology Pharmacy Patient Advocate  [facility]  [phone] (phone) [phone] (fax) 02/20/2023 10:14 AM

## 2023-02-20 NOTE — Telephone Encounter (Signed)
Rosuvastatin dose decrease due to interaction with darolutamide (prostate cancer medication)

## 2023-02-20 NOTE — Telephone Encounter (Signed)
Oral Oncology Patient Advocate Encounter  Prior Authorization for Merleen Nicely has been approved.    PA# [id number]  Effective dates: 02/20/23 through 08/22/23  Patients co-pay is $11.20.    Ardeen Fillers, CPhT Oncology Pharmacy Patient Advocate  [facility]  [phone] (phone) [phone] (fax) 02/20/2023 10:15 AM

## 2023-02-21 ENCOUNTER — Other Ambulatory Visit: Payer: Self-pay

## 2023-02-21 DIAGNOSIS — C61 Malignant neoplasm of prostate: Secondary | ICD-10-CM

## 2023-02-22 ENCOUNTER — Other Ambulatory Visit: Payer: Self-pay | Admitting: Radiology

## 2023-02-22 ENCOUNTER — Inpatient Hospital Stay: Payer: Medicare Other | Admitting: Licensed Clinical Social Worker

## 2023-02-22 ENCOUNTER — Encounter: Payer: Self-pay | Admitting: Hematology

## 2023-02-22 ENCOUNTER — Inpatient Hospital Stay: Payer: Medicare Other

## 2023-02-22 ENCOUNTER — Other Ambulatory Visit: Payer: Medicare Other

## 2023-02-22 ENCOUNTER — Other Ambulatory Visit: Payer: Self-pay | Admitting: Student

## 2023-02-22 ENCOUNTER — Other Ambulatory Visit: Payer: Medicare Other | Admitting: Licensed Clinical Social Worker

## 2023-02-22 DIAGNOSIS — C61 Malignant neoplasm of prostate: Secondary | ICD-10-CM

## 2023-02-22 DIAGNOSIS — Z95828 Presence of other vascular implants and grafts: Secondary | ICD-10-CM

## 2023-02-22 MED ORDER — LIDOCAINE-PRILOCAINE 2.5-2.5 % EX CREA
TOPICAL_CREAM | CUTANEOUS | 3 refills | Status: DC
Start: 2023-02-22 — End: 2024-06-27

## 2023-02-22 MED ORDER — PROCHLORPERAZINE MALEATE 10 MG PO TABS
10.0000 mg | ORAL_TABLET | Freq: Four times a day (QID) | ORAL | 1 refills | Status: DC | PRN
Start: 2023-02-22 — End: 2023-04-03

## 2023-02-22 NOTE — Patient Instructions (Addendum)
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  We will treat you in the clinic every 3 weeks with a chemotherapy drug called docetaxel (Taxotere).  The intent of treatment is to shrink and control this cancer, prevent it from spreading further, and to alleviate any symptoms you may be having related to this cancer. You will see the doctor regularly throughout treatment.  We will obtain blood work from you prior to every treatment and monitor your results to make sure it is safe to give your treatment. The doctor monitors your response to treatment by the way you are feeling, your blood work, and by obtaining scans periodically.  There will be wait times while you are here for treatment.  It will take about 30 minutes to 1 hour for your lab work to result.  Then there will be wait times while pharmacy mixes your medications.       Medications you will receive in the clinic prior to your chemotherapy medications:   Aloxi:  ALOXI is used in adults to help prevent nausea and vomiting that happens with certain chemotherapy drugs.  Aloxi is a long acting medication, and will remain in your system for about two days.    Dexamethasone:  This is a steroid given prior to chemotherapy to help prevent allergic reactions; it may also help prevent and control nausea and diarrhea.        Docetaxel (Taxotere)    About This Drug    Docetaxel is used to treat cancer. It is given in the vein (IV). It will take 1 hour to infuse.  The first infusion will take longer (about 1.5 hours) due to the fact that we start this drug at a very slow rate and gradually increase the rate of infusion until the maximum rate is reached.  This is done to monitor for infusion reactions, and to make sure you will tolerated this drug without adverse effects.  If you tolerate the first infusion, all subsequent infusions will be started at the normal rate and given over 1  hour.   Possible Side Effects     Bone marrow suppression. This is a decrease in the number of white blood cells, red blood cells, and platelets. This may raise your risk of infection, make you tired and weak, and raise your risk of bleeding.     Neutropenic fever. A type of fever that can develop when you have a very low number of white blood cells which can be life-threatening.     Soreness of the mouth and throat. You may have red areas, white patches, or sores that hurt.     Nausea and vomiting (throwing up)     Constipation (not able to move bowels)     Diarrhea (loose bowel movements)     Infections     Fluid retention - swelling of the hands, feet, or any other part of the body. Fluid may build-up around your lungs and/or heart.     Changes in the way food and drinks taste     Effects on the nerves are called peripheral neuropathy. You may feel numbness, tingling, or pain in your hands and feet. It may be hard for you to button your clothes, open jars, or walk as usual. The effect on the nerves may get worse with more doses of the drug. These effects get better in some people after the drug is stopped but it does  not get better in all people.     Decreased appetite (decreased hunger)     Weakness     Pain     Muscle pain/aching     Trouble breathing     Changes in your nail color, you may have nail loss and/or brittle nail     Hair loss. Hair loss is often temporary, although there have been cases of permanent hair loss reported. Hair loss may happen suddenly or gradually. If you lose hair, you may lose it from your head, face, armpits, pubic area, chest, and/or legs. You may also notice your hair getting thin.     Allergic skin reaction. You may develop blisters on your skin that are filled with fluid or a severe red rash all over your body that may be painful.     Allergic reactions, including anaphylaxis are rare but may happen in some patients. Signs of allergic  reaction to this drug may be swelling of the face, feeling like your tongue or throat are swelling, trouble breathing, rash, itching, fever, chills, feeling dizzy, and/or feeling that your heart is beating in a fast or not normal way. If this happens, do not take another dose of this drug. You should get urgent medical treatment.    Note: Not all possible side effects are included above.    Warnings and Precautions     Severe bone marrow suppression, including febrile neutropenia which can be life-threatening     Severe allergic reactions, including anaphylaxis which can be life-threatening  Colitis, which is swelling (inflammation) in the colon - symptoms are diarrhea (loose bowel movements), stomach cramping, and sometimes blood in the bowel movements  Severe skin reactions, including redness, swelling, or peeling of skin  Severe swelling in the eye or other changes in eyesight     Severe fluid retention     If you have a history of abnormal liver function, receive high doses of docetaxel, or have a history of lung cancer and have received treatment with a platinum (type of chemotherapy medication), you have an increased risk of death.     Severe weakness     This drug may raise your risk of getting a second cancer such as leukemia, lymphoma, myelodysplastic syndrome, and kidney cancer.     Severe peripheral neuropathy     This drug contains alcohol and may affect your central nervous system. The central nervous system is made up of your brain and spinal cord. You may feel dizzy and very sleepy.     Tumor lysis syndrome: This drug may act on the cancer cells very quickly. This may affect how your kidneys work. Note: Some of the side effects above are very rare. If you have concerns and/or questions, please discuss them with your medical team. Important Information     This drug may be present in the saliva, tears, sweat, urine, stool, vomit, semen, and vaginal secretions. Talk to your  doctor and/or your nurse about the necessary precautions to take during this time.     This drug may impair your ability to drive or use machinery. Use caution and tell your nurse or doctor if you feel dizzy, very sleepy, and/or experience low blood pressure. Treating Side Effects     Manage tiredness by pacing your activities for the day.     Be sure to include periods of rest between energy-draining activities.     Get regular exercise. If you feel too tired to exercise vigorously, try taking a  short walk.      To decrease the risk of infection, wash your hands regularly.     Avoid close contact with people who have a cold, the flu, or other infections.     Take your temperature as your doctor or nurse tells you, and whenever you feel like you may have a fever.     To help decrease the risk of bleeding, use a soft toothbrush. Check with your nurse before using dental floss.     Be very careful when using knives or tools.     Use an electric shaver instead of a razor.     Mouth care is very important and will help food taste better and improve your appetite. Your mouth care should consist of routine, gentle cleaning of your teeth or dentures and rinsing your mouth with a mixture of 1/2 teaspoon of salt in 8 ounces of water or 1/2 teaspoon of baking soda in 8 ounces of water. This should be done at least after each meal and at bedtime.     If you have mouth sores, avoid mouthwash that has alcohol. Also avoid alcohol and smoking because they can bother your mouth and throat.     Taking good care of your mouth may help food taste better and improve your appetite     Drink plenty of fluids (a minimum of eight glasses per day is recommended).     If you throw up or have diarrhea, you should drink more fluids so that you do not become dehydrated (lack of water in the body from losing too much fluid).     If you have diarrhea, eat low-fiber foods that are high in protein and calories and  avoid foods that can irritate your digestive tracts or lead to cramping.     If you are not able to move your bowels, check with your doctor or nurse before you use enemas, laxatives, or suppositories.     Ask your doctor or nurse about medicines that are available to help stop or lessen constipation and/ or diarrhea.     To help with nausea and vomiting, eat small, frequent meals instead of three large meals a day. Choose foods and drinks that are at room temperature. Ask your nurse or doctor about other helpful tips and medicine that is available to help stop or lessen these symptoms.     To help with decreased appetite, eat foods high in calories and protein, such as meat, poultry, fish, dry beans, tofu, eggs, nuts, milk, yogurt, cheese, ice cream, pudding, and nutritional supplements.     Consider using sauces and spices to increase taste. Daily exercise, with your doctor's approval, may increase your appetite.     Keeping your pain under control is important to your well-being. Please tell your doctor or nurse if you are experiencing pain.     If you get a rash do not put anything on it unless your doctor or nurse says you may. Keep the area around the rash clean and dry. Ask your doctor for medicine if your rash bothers you.     Keeping your nails moisturized may help with brittleness.     To help with hair loss, wash with a mild shampoo and avoid washing your hair every day. Avoid coloring your hair.     Avoid rubbing your scalp, pat your hair or scalp dry.     Limit your use of hair spray, electric curlers, blow dryers, and curling irons.  If you are interested in getting a wig, talk to your nurse and they can help you get in touch with programs in your local area.     If you have numbness and tingling in your hands and feet, be careful when cooking, walking, and handling sharp objects and hot liquids.    Food and Drug Interactions     This drug may interact with grapefruit  and grapefruit juice. Talk to your doctor as this could make side effects worse.     This drug may interact with other medicines. Tell your doctor and pharmacist about all the prescription and over-the-counter medicines and dietary supplements (vitamins, minerals, herbs, and others) that you are taking at this time. Also, check with your doctor or pharmacist before starting any new prescription or over-the-counter medicines, or dietary supplements to make sure that there are no interactions.     This drug may interact with St. John's Wort and may lower the levels of the drug in your body, which can make it less effective. When to Call the Doctor Call your doctor or nurse if you have any of these symptoms and/or any new or unusual symptoms:     Fever of 100.4 F (38 C) or higher     Chills     Blurred vision or other changes in eyesight, excessive tearing     Symptoms of being drunk, confusion, or being very sleepy     Feeling dizzy or lightheaded   Tiredness and/or weakness that interferes with your daily activities  Easy bruising or bleeding     Wheezing and/or trouble breathing     Chest pain     Pain in your mouth or throat that makes it hard to eat or drink     Nausea that stops you from eating or drinking and/or is not relieved by prescribed medicines     Throwing up more than 3 times a day     Lasting loss of appetite or rapid weight loss of five pounds in a week     Diarrhea, 4 times in one day or diarrhea with lack of strength or a feeling of being dizzy     No bowel movement in 3 days or when you feel uncomfortable     Pain in your abdomen that does not go away     Blood in your stool     Swelling of the hands, feet, or any other part of the body     Weight gain of 5 pounds in one week (fluid retention)     New rash and/or itching  Rash that is not relieved by prescribed medicines     Signs of inflammation/infection (redness, swelling, pain) of the tissue around  your nails.     Signs of allergic reaction: swelling of the face, feeling like your tongue or throat are swelling, trouble breathing, rash, itching, fever, chills, feeling dizzy, and/or feeling that your heart is beating in a fast or not normal way. If this happens, call 911 for emergency care.     Flu-like symptoms: fever, headache, muscle and joint aches, and fatigue (low energy, feeling weak)     Signs of possible liver problems: dark urine, pale bowel movements, pain in your abdomen, feeling very tired and weak, unusual itching, or yellowing of the eyes or skin     Numbness, tingling, or pain in your hands and feet     Signs of tumor lysis: confusion or agitation, decreased urine, nausea/vomiting, diarrhea, muscle  cramping, numbness and/or tingling, seizures.     General pain that does not go away or is not relieved by prescribed medicine     If you think you may be pregnant or have impregnated your partner    Reproduction Warnings     Pregnancy warning: This drug can have harmful effects on the unborn baby. Women of childbearing potential should use effective methods of birth control during your cancer treatment and for 6 months after stopping treatment. Men with male partners of childbearing potential should use effective methods of birth control during your cancer treatment and for 3 months after stopping treatment. Let your doctor know right away if you think you may be pregnant or may have impregnated your partner.     Breastfeeding warning: Women should not breastfeed during treatment and for 1 week after stopping treatment because this drug could enter the breast milk and cause harm to a breastfeeding baby.     Fertility warning: In men, this drug may affect your ability to have children in the future. Talk with your doctor or nurse if you plan to have children. Ask for information on sperm banking.    SELF CARE ACTIVITIES WHILE ON  CHEMOTHERAPY/IMMUNOTHERAPY:  Hydration Increase your fluid intake and drink at least 64 ounces (2 liters) of water/decaffeinated beverages per day after treatment. You can still have your cup of coffee or soda but these beverages do not count as part of the 64 ounces that you need to drink daily. Limit alcohol intake.  Medications Continue taking your normal prescription medication as prescribed.  If you start any new herbal or new supplements please let us know first to make sure it is safe.  Mouth Care Have teeth cleaned professionally before starting treatment. Keep dentures and partial plates clean. Use soft toothbrush and do not use mouthwashes that contain alcohol. Biotene is a good mouthwash that is available at most pharmacies or may be ordered by calling [phone]. Use warm salt water gargles (1 teaspoon salt per 1 quart warm water) before and after meals and at bedtime. If you are still having problems with your mouth or sores in your mouth please call the clinic. If you need dental work, please let the doctor know before you go for your appointment so that we can coordinate the best possible time for you in regards to your chemo regimen. You need to also let your dentist know that you are actively taking chemo. We may need to do labs prior to your dental appointment.  Skin Care Always use sunscreen that has not expired and with SPF (Sun Protection Factor) of 50 or higher. Wear hats to protect your head from the sun. Remember to use sunscreen on your hands, ears, face, & feet.  Use good moisturizing lotions such as udder cream, eucerin, or even Vaseline. Some chemotherapies can cause dry skin, color changes in your skin and nails.    Avoid long, hot showers or baths. Use gentle, fragrance-free soaps and laundry detergent. Use moisturizers, preferably creams or ointments rather than lotions because the thicker consistency is better at preventing skin dehydration. Apply the cream or  ointment within 15 minutes of showering. Reapply moisturizer at night, and moisturize your hands every time after you wash them.   Infection Prevention Please wash your hands for at least 30 seconds using warm soapy water. Handwashing is the #1 way to prevent the spread of germs. Stay away from sick people or people who are getting over a cold.  If you develop respiratory systems such as green/yellow mucus production or productive cough or persistent cough let us know and we will see if you need an antibiotic. It is a good idea to keep a pair of gloves on when going into grocery stores/[employer] to decrease your risk of coming into contact with germs on the carts, etc. Carry alcohol hand gel with you at all times and use it frequently if out in public. If your temperature reaches 100.5 or higher please call the clinic and let us know.  If it is after hours or on the weekend please go to the ER if your temperature is over 100.4.  Please have your own personal thermometer at home to use.    Sex and bodily fluids If you are going to have sex, a condom must be used to protect the person that isn't taking immunotherapy. For a few days after treatment, immunotherapy can be excreted through your bodily fluids.  When using the toilet please close the lid and flush the toilet twice.  Do this for a few day after you have had immunotherapy.   Contraception It is not known for sure whether or not immunotherapy drugs can be passed on through semen or secretions from the vagina. Because of this some doctors advise people to use a barrier method if you have sex during treatment. This applies to vaginal, anal or oral sex.  Generally, doctors advise a barrier method only for the time you are actually having the treatment and for about a week after your treatment.  Advice like this can be worrying, but this does not mean that you have to avoid being intimate with your partner. You can still have close contact with your  partner and continue to enjoy sex.  Animals If you have cats or birds we ask that you not change the litter or change the cage.  Please have someone else do this for you while you are on immunotherapy.   Food Safety During and After Cancer Treatment Food safety is important for people both during and after cancer treatment. Cancer and cancer treatments, such as chemotherapy, radiation therapy, and stem cell/bone marrow transplantation, often weaken the immune system. This makes it harder for your body to protect itself from foodborne illness, also called food poisoning. Foodborne illness is caused by eating food that contains harmful bacteria, parasites, or viruses.  Foods to avoid Some foods have a higher risk of becoming tainted with bacteria. These include: Unwashed fresh fruit and vegetables, especially leafy vegetables that can hide dirt and other contaminants Raw sprouts, such as alfalfa sprouts Raw or undercooked beef, especially ground beef, or other raw or undercooked meat and poultry Fatty, fried, or spicy foods immediately before or after treatment.  These can sit heavy on your stomach and make you feel nauseous. Raw or undercooked shellfish, such as oysters. Sushi and sashimi, which often contain raw fish.  Unpasteurized beverages, such as unpasteurized fruit juices, raw milk, raw yogurt, or cider Undercooked eggs, such as soft boiled, over easy, and poached; raw, unpasteurized eggs; or foods made with raw egg, such as homemade raw cookie dough and homemade mayonnaise  Simple steps for food safety  Shop smart. Do not buy food stored or displayed in an unclean area. Do not buy bruised or damaged fruits or vegetables. Do not buy cans that have cracks, dents, or bulges. Pick up foods that can spoil at the end of your shopping trip and store them in a cooler on  the way home.  Prepare and clean up foods carefully. Rinse all fresh fruits and vegetables under running water, and dry  them with a clean towel or paper towel. Clean the top of cans before opening them. After preparing food, wash your hands for 20 seconds with hot water and soap. Pay special attention to areas between fingers and under nails. Clean your utensils and dishes with hot water and soap. Disinfect your kitchen and cutting boards using 1 teaspoon of liquid, unscented bleach mixed into 1 quart of water.    Dispose of old food. Eat canned and packaged food before its expiration date (the "use by" or "best before" date). Consume refrigerated leftovers within 3 to 4 days. After that time, throw out the food. Even if the food does not smell or look spoiled, it still may be unsafe. Some bacteria, such as Listeria, can grow even on foods stored in the refrigerator if they are kept for too long.  Take precautions when eating out. At restaurants, avoid buffets and salad bars where food sits out for a long time and comes in contact with many people. Food can become contaminated when someone with a virus, often a norovirus, or another "bug" handles it. Put any leftover food in a "to-go" container yourself, rather than having the server do it. And, refrigerate leftovers as soon as you get home. Choose restaurants that are clean and that are willing to prepare your food as you order it cooked.    SYMPTOMS TO REPORT AS SOON AS POSSIBLE AFTER TREATMENT:  FEVER GREATER THAN 100.4 F CHILLS WITH OR WITHOUT FEVER NAUSEA AND VOMITING THAT IS NOT CONTROLLED WITH YOUR NAUSEA MEDICATION UNUSUAL SHORTNESS OF BREATH UNUSUAL BRUISING OR BLEEDING TENDERNESS IN MOUTH AND THROAT WITH OR WITHOUT PRESENCE OF ULCERS URINARY PROBLEMS BOWEL PROBLEMS UNUSUAL RASH     Wear comfortable clothing and clothing appropriate for easy access to any Portacath or PICC line. Let us know if there is anything that we can do to make your therapy better!   What to do if you need assistance after hours or on the weekends: CALL [phone].   HOLD on the line, do not hang up.  You will hear multiple messages but at the end you will be connected with a nurse triage line.  They will contact the doctor if necessary.  Most of the time they will be able to assist you.  Do not call the hospital operator.    I have been informed and understand all of the instructions given to me and have received a copy. I have been instructed to call the clinic [phone] or my family physician as soon as possible for continued medical care, if indicated. I do not have any more questions at this time but understand that I may call the Cancer Center or the Patient Navigator at [phone] during office hours should I have questions or need assistance in obtaining follow-up care.

## 2023-02-22 NOTE — Progress Notes (Signed)
[facility] Clinical Social Work  Initial Assessment   Tony Powell is a 72 y.o. year old male accompanied by patient and son. Clinical Social Work was referred by medical provider for assessment of psychosocial needs.   SDOH (Social Determinants of Health) assessments performed: Yes SDOH Interventions    Flowsheet Row Clinical Support from 09/14/2022 in [facility]  SDOH Interventions   Food Insecurity Interventions Intervention Not Indicated  Housing Interventions Intervention Not Indicated  Utilities Interventions Intervention Not Indicated  Alcohol Usage Interventions Intervention Not Indicated (Score <7)  Financial Strain Interventions Intervention Not Indicated  Physical Activity Interventions Patient Refused  Stress Interventions Intervention Not Indicated  Social Connections Interventions Intervention Not Indicated       SDOH Screenings   Food Insecurity: No Food Insecurity (02/15/2023)  Housing: Low Risk  (02/15/2023)  Transportation Needs: No Transportation Needs (02/15/2023)  Utilities: Not At Risk (02/15/2023)  Alcohol Screen: Low Risk  (09/14/2022)  Depression (PHQ2-9): Low Risk  (02/15/2023)  Financial Resource Strain: Low Risk  (09/14/2022)  Physical Activity: Inactive (09/14/2022)  Social Connections: Socially Isolated (09/14/2022)  Stress: No Stress Concern Present (09/14/2022)  Tobacco Use: Medium Risk (02/22/2023)     Distress Screen completed: Yes    09/24/2020    1:39 PM  ONCBCN DISTRESS SCREENING  Screening Type Initial Screening  Distress experienced in past week (1-10) 0      Family/Social Information:  Housing Arrangement: patient lives with his son Tony Powell. Family members/support persons in your life? Pt has very limited support.  Pt's son has not worked since February as his father has required a great deal of assistance.  Son inquiring if he may be able to get paid to be his father's caretaker.  CSW spoke w/ pt/son about the application  process for Medicaid and assessment for PCA services which pt's son could apply to be paid for.  CSW informed pt and son this is a lengthy process that won't immediately secure a stipend for care provided, but may make sense long term.  Transportation concerns: Pt is concerned about paying for gas or transportation.  Pt's son has been transporting pt; however, they live in [place] and are driving a significant distance.  Employment: Retired from working as a Freight forwarder.  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 yes Concerns about diagnosis and/or treatment: Overwhelmed by information, How will I care for myself, and Quality of life Patient reported stressors: Finances, Transportation, Adjusting to my illness, Facing my mortality, and Physical issues Hopes and/or priorities: Pt's priority is to start treatment w/ the hopes of positive results Patient enjoys  not discussed Current coping skills/ strengths: Motivation for treatment/growth     SUMMARY: Current SDOH Barriers:  Financial constraints related to fixed income, Limited social support, and Transportation  Clinical Social Work Clinical Goal(s):  Explore community resource options for unmet needs related to:  Insurance risk surveyor Strain   Interventions: Discussed common feeling and emotions when being diagnosed with cancer, and the importance of support during treatment Informed patient of the support team roles and support services at [facility] Provided CSW contact information and encouraged patient to call with any questions or concerns Referred patient to [facility] and provided w/ application to apply  for transportation  reimbursement.  Informed of the [employer] and application process.  Discussed Medicaid application and process for applying for PCA services.   Follow Up Plan: Patient will contact CSW with any support or resource needs Patient verbalizes understanding of plan: Yes    Rachel Moulds, LCSW

## 2023-02-23 ENCOUNTER — Other Ambulatory Visit: Payer: Self-pay

## 2023-02-23 ENCOUNTER — Encounter (HOSPITAL_COMMUNITY): Payer: Self-pay

## 2023-02-23 ENCOUNTER — Ambulatory Visit (HOSPITAL_COMMUNITY)
Admission: RE | Admit: 2023-02-23 | Discharge: 2023-02-23 | Disposition: A | Discharge status: Home / Self Care | Payer: Medicare Other | Source: Ambulatory Visit | Attending: Hematology | Admitting: Hematology

## 2023-02-23 ENCOUNTER — Encounter: Payer: Self-pay | Admitting: Hematology

## 2023-02-23 DIAGNOSIS — C7951 Secondary malignant neoplasm of bone: Secondary | ICD-10-CM | POA: Diagnosis not present

## 2023-02-23 DIAGNOSIS — C61 Malignant neoplasm of prostate: Secondary | ICD-10-CM | POA: Diagnosis present

## 2023-02-23 HISTORY — PX: IR FLUORO GUIDED NEEDLE PLC ASPIRATION/INJECTION LOC: IMG2395

## 2023-02-23 LAB — PROTIME-INR
INR: 1.1 (ref 0.8–1.2)
Prothrombin Time: 14.2 seconds (ref 11.4–15.2)

## 2023-02-23 LAB — CBC
HCT: 40 % (ref 39.0–52.0)
Hemoglobin: 12.8 g/dL — ABNORMAL LOW (ref 13.0–17.0)
MCH: 26.6 pg (ref 26.0–34.0)
MCHC: 32 g/dL (ref 30.0–36.0)
MCV: 83 fL (ref 80.0–100.0)
Platelets: 221 10*3/uL (ref 150–400)
RBC: 4.82 MIL/uL (ref 4.22–5.81)
RDW: 15 % (ref 11.5–15.5)
WBC: 6.6 10*3/uL (ref 4.0–10.5)
nRBC: 0 % (ref 0.0–0.2)

## 2023-02-23 MED ORDER — BUPIVACAINE HCL (PF) 0.25 % IJ SOLN
INTRAMUSCULAR | Status: AC
  Filled 2023-02-23: qty 30

## 2023-02-23 MED ORDER — SODIUM CHLORIDE 0.9 % IV SOLN: INTRAVENOUS | Status: DC

## 2023-02-23 MED ORDER — LABETALOL HCL 5 MG/ML IV SOLN
INTRAVENOUS | Status: AC | PRN
  Administered 2023-02-23: 10 mg via INTRAVENOUS

## 2023-02-23 MED ORDER — GELATIN ABSORBABLE 12-7 MM EX MISC
CUTANEOUS | Status: AC
  Filled 2023-02-23: qty 1

## 2023-02-23 MED ORDER — MIDAZOLAM HCL 2 MG/2ML IJ SOLN
INTRAMUSCULAR | Status: AC | PRN
  Administered 2023-02-23: .5 mg via INTRAVENOUS
  Administered 2023-02-23: 1 mg via INTRAVENOUS
  Administered 2023-02-23 (×2): .5 mg via INTRAVENOUS

## 2023-02-23 MED ORDER — BUPIVACAINE HCL (PF) 0.5 % IJ SOLN
INTRAMUSCULAR | Status: AC
  Filled 2023-02-23: qty 30

## 2023-02-23 MED ORDER — MIDAZOLAM HCL 2 MG/2ML IJ SOLN
INTRAMUSCULAR | Status: AC
  Filled 2023-02-23: qty 2

## 2023-02-23 MED ORDER — LABETALOL HCL 5 MG/ML IV SOLN
INTRAVENOUS | Status: AC
  Filled 2023-02-23: qty 4

## 2023-02-23 MED ORDER — CEFAZOLIN SODIUM-DEXTROSE 2-4 GM/100ML-% IV SOLN
INTRAVENOUS | Status: AC | PRN
  Administered 2023-02-23: 2 g via INTRAVENOUS

## 2023-02-23 MED ORDER — FENTANYL CITRATE (PF) 100 MCG/2ML IJ SOLN
INTRAMUSCULAR | Status: AC
  Filled 2023-02-23: qty 2

## 2023-02-23 MED ORDER — LIDOCAINE HCL (PF) 1 % IJ SOLN
INTRAMUSCULAR | Status: AC
  Filled 2023-02-23: qty 30

## 2023-02-23 MED ORDER — CEFAZOLIN SODIUM-DEXTROSE 2-4 GM/100ML-% IV SOLN
INTRAVENOUS | Status: AC
  Filled 2023-02-23: qty 100

## 2023-02-23 MED ORDER — FENTANYL CITRATE (PF) 100 MCG/2ML IJ SOLN
INTRAMUSCULAR | Status: AC | PRN
  Administered 2023-02-23: 50 ug via INTRAVENOUS
  Administered 2023-02-23 (×3): 25 ug via INTRAVENOUS

## 2023-02-23 NOTE — Procedures (Signed)
INTERVENTIONAL NEURORADIOLOGY BRIEF POSTPROCEDURE NOTE  FLUOROSCOPY GUIDED T12 CORE BONE BIOPSY  Attending: Dr. Baldemar Lenis  Diagnosis: Suspected metastatic lesion  Access site: Percutaneous  Anesthesia: Moderate sedation  Medication used: 2.5 mg Versed IV; 125 mcg Fentanyl IV.  Complications: None.  Estimated blood loss: Minimal  Specimen: 1 core biopsy sample  Left transpedicular approach utilized for fluoroscopy guided core bone biopsy. A large core sample was obtained from the left pedicle and vertebral body. Brisk blood return observed after removal of biopsy needle, suggesting high tumor vascularity. Geofoam pledgets were utilized with resolution of blood return through canula. The canula was then removed.   The patient tolerated the procedure well without incident or complication and is in stable condition.

## 2023-02-23 NOTE — H&P (Signed)
Chief Complaint: Patient was seen in consultation today for T12, L2 spinal lesions  Referring Physician(s): Katragadda,Sreedhar  Supervising Physician: Julieanne Cotton  Patient Status: Tony Powell - Out-pt  History of Present Illness: Tony Powell is a 72 y.o. male with past medical history of metastatic prostate cancer followed by Dr. Ellin Saba who is recently known to IR from Port-A-Cath placement 02/19/23.  He now returns to [facility] for bone lesion biopsy due to lesions at T12, L2.    [name] presents today in his usual state of health.  He has been NPO.  He does not take blood thinners.  His son is available for transportation and care at home.   Past Medical History:  Diagnosis Date   Abnormal stress test    Borderline hypertension    Chest discomfort    Atypical   DJD (degenerative joint disease)    Knees, lumbar sacral spine   Hyperlipidemia    Hypertension    Port-A-Cath in place 02/22/2023   Prostate cancer     Past Surgical History:  Procedure Laterality Date   BACK SURGERY     CYSTOSCOPY WITH FULGERATION  04/18/2021   Procedure: CYSTOSCOPY WITH FULGERATION of BLADDER NECK;  Surgeon: Malen Gauze, MD;  Location: [facility];  Service: Urology;;   CYSTOSCOPY WITH INJECTION N/A 04/18/2021   Procedure: CYSTOSCOPY WITH INJECTION- Botox 100 units;  Surgeon: Malen Gauze, MD;  Location: [facility];  Service: Urology;  Laterality: N/A;   CYSTOSCOPY WITH RETROGRADE PYELOGRAM, URETEROSCOPY AND STENT PLACEMENT Left 02/01/2023   Procedure: CYSTOSCOPY WITH RETROGRADE PYELOGRAM, URETEROSCOPY AND STENT PLACEMENT;  Surgeon: Malen Gauze, MD;  Location: [facility];  Service: Urology;  Laterality: Left;   IR IMAGING GUIDED PORT INSERTION  02/19/2023   STONE EXTRACTION WITH BASKET Left 02/01/2023   Procedure: STONE EXTRACTION WITH BASKET;  Surgeon: Malen Gauze, MD;  Location: [facility];  Service: Urology;  Laterality: Left;   TRANSURETHRAL RESECTION OF BLADDER TUMOR  N/A 08/25/2020   Procedure: TRANSURETHRAL RESECTION OF BLADDER TUMOR (TURBT);  Surgeon: Malen Gauze, MD;  Location: [facility];  Service: Urology;  Laterality: N/A;    Allergies: Celebrex [celecoxib]  Medications: Prior to Admission medications   Medication Sig Start Date End Date Taking? Authorizing Provider  alfuzosin (UROXATRAL) 10 MG 24 hr tablet Take 10 mg by mouth daily with breakfast.   Yes [provider]  cephALEXin (KEFLEX) 500 MG capsule Take 500 mg by mouth 2 (two) times daily as needed (burning during urination).   Yes [provider]  cyclobenzaprine (FLEXERIL) 5 MG tablet Take 1 tablet (5 mg total) by mouth 3 (three) times daily as needed for muscle spasms. 02/07/23  Yes McKenzie, Mardene Celeste, MD  finasteride (PROSCAR) 5 MG tablet Take 1 tablet (5 mg total) by mouth daily. 05/12/22  Yes McKenzie, Mardene Celeste, MD  megestrol (MEGACE) 20 MG tablet TAKE ONE (1) TABLET (20 MG TOTAL) BY MOUTH THREE (3) (THREE) TIMES DAILY AS NEEDED. 12/25/22  Yes McKenzie, Mardene Celeste, MD  Multiple Vitamin (MULTIVITAMIN) capsule Take 1 capsule by mouth daily.   Yes [provider]  naproxen sodium (ALEVE) 220 MG tablet Take 220 mg by mouth once.   Yes [provider]  oxybutynin (DITROPAN XL) 15 MG 24 hr tablet TAKE ONE (1) TABLET (15 MG TOTAL) BY MOUTH DAILY. 12/05/22  Yes McKenzie, Mardene Celeste, MD  oxyCODONE (ROXICODONE) 5 MG immediate release tablet Take 2 tablets (10 mg total) by mouth every 6 (six)  hours as needed for severe pain. 02/15/23  Yes Doreatha Massed, MD  tamsulosin (FLOMAX) 0.4 MG CAPS capsule Take 1 capsule (0.4 mg total) by mouth daily. 10/27/22  Yes McKenzie, Mardene Celeste, MD  darolutamide (NUBEQA) 300 MG tablet Take 2 tablets (600 mg total) by mouth 2 (two) times daily with a meal. 02/15/23   Doreatha Massed, MD  DOCETAXEL IV Inject into the vein every 21 ( twenty-one) days. 02/26/23   [provider]  Iron, Ferrous Sulfate, 325 (65 Fe) MG TABS  Take 325 mg by mouth every other day. 08/20/22   Tommie Sams, DO  lidocaine-prilocaine (EMLA) cream Apply a quarter sized amount to port a cath site and cover with plastic wrap one hour prior to infusion appointments 02/22/23   Doreatha Massed, MD  oxyCODONE-acetaminophen (PERCOCET/ROXICET) 5-325 MG tablet Take 1 tablet by mouth every 4 (four) hours as needed. 01/01/23   Burgess Amor, PA-C  prochlorperazine (COMPAZINE) 10 MG tablet Take 1 tablet (10 mg total) by mouth every 6 (six) hours as needed for nausea or vomiting. 02/22/23   Doreatha Massed, MD  rosuvastatin (CRESTOR) 5 MG tablet Take 1 tablet (5 mg total) by mouth daily. 02/20/23   Tommie Sams, DO  sildenafil (VIAGRA) 100 MG tablet Take 1 tablet (100 mg total) by mouth daily as needed for erectile dysfunction. 08/25/22   Tommie Sams, DO  valsartan (DIOVAN) 160 MG tablet Take 1 tablet (160 mg total) by mouth daily. 08/17/22   Tommie Sams, DO     Family History  Problem Relation Age of Onset   Heart attack Mother    Breast cancer Mother        Mastectomy   Leukemia Father    Cancer Brother    Cancer Brother    Cancer Brother    Cancer Brother    Cancer Brother    Colon cancer Neg Hx    Prostate cancer Neg Hx    Pancreatic cancer Neg Hx     Social History   Socioeconomic History   Marital status: Divorced    Spouse name: Not on file   Number of children: 2   Years of education: Not on file   Highest education level: Not on file  Occupational History   Occupation: Long Arts administrator: [employer]  Tobacco Use   Smoking status: Former    Packs/day: 1.00    Years: 20.00    Additional pack years: 0.00    Total pack years: 20.00    Types: Cigarettes    Quit date: 11/13/1998    Years since quitting: 24.2    Passive exposure: Current   Smokeless tobacco: Never  Vaping Use   Vaping Use: Never used  Substance and Sexual Activity   Alcohol use: No   Drug use: Never   Sexual activity: Not  Currently  Other Topics Concern   Not on file  Social History Narrative   Divorced   Social Determinants of Health   Financial Resource Strain: Low Risk  (09/14/2022)   Overall Financial Resource Strain (CARDIA)    Difficulty of Paying Living Expenses: Not hard at all  Food Insecurity: No Food Insecurity (02/15/2023)   Hunger Vital Sign    Worried About Running Out of Food in the Last Year: Never true    Ran Out of Food in the Last Year: Never true  Transportation Needs: No Transportation Needs (02/15/2023)   PRAPARE - Transportation    Lack of  Transportation (Medical): No    Lack of Transportation (Non-Medical): No  Physical Activity: Inactive (09/14/2022)   Exercise Vital Sign    Days of Exercise per Week: 0 days    Minutes of Exercise per Session: 0 min  Stress: No Stress Concern Present (09/14/2022)   [employer] of Occupational Health - Occupational Stress Questionnaire    Feeling of Stress : Only a little  Social Connections: Socially Isolated (09/14/2022)   Social Connection and Isolation Panel [NHANES]    Frequency of Communication with Friends and Family: Twice a week    Frequency of Social Gatherings with Friends and Family: Never    Attends Religious Services: 1 to 4 times per year    Active Member of [employer] or Organizations: No    Attends Banker Meetings: Never    Marital Status: Divorced     Review of Systems: A 12 point ROS discussed and pertinent positives are indicated in the HPI above.  All other systems are negative.  Review of Systems  Constitutional:  Negative for fatigue and fever.  Respiratory:  Negative for cough and shortness of breath.   Cardiovascular:  Negative for chest pain.  Gastrointestinal:  Negative for abdominal pain and nausea.  Musculoskeletal:  Positive for back pain.  Psychiatric/Behavioral:  Negative for behavioral problems and confusion.     Vital Signs: BP (!) 184/98 Comment: rn aware  Pulse 81   Temp 98.1 F (36.7  C) (Oral)   Resp 18   Ht 5\' 10"  (1.778 m)   Wt 240 lb (108.9 kg)   SpO2 98%   BMI 34.44 kg/m   Physical Exam Vitals and nursing note reviewed.  Constitutional:      General: He is not in acute distress.    Appearance: Normal appearance. He is not ill-appearing.  HENT:     Mouth/Throat:     Mouth: Mucous membranes are moist.     Pharynx: Oropharynx is clear.  Cardiovascular:     Rate and Rhythm: Normal rate and regular rhythm.     Pulses: Normal pulses.     Heart sounds: Normal heart sounds.  Pulmonary:     Effort: Pulmonary effort is normal. No respiratory distress.     Breath sounds: Normal breath sounds.  Abdominal:     General: Abdomen is flat.     Palpations: Abdomen is soft.  Skin:    General: Skin is warm and dry.  Neurological:     General: No focal deficit present.     Mental Status: He is alert and oriented to person, place, and time. Mental status is at baseline.  Psychiatric:        Mood and Affect: Mood normal.        Behavior: Behavior normal.        Thought Content: Thought content normal.        Judgment: Judgment normal.      MD Evaluation Airway: WNL Heart: WNL Abdomen: WNL Chest/ Lungs: WNL ASA  Classification: 3 Mallampati/Airway Score: Two   Imaging: MR Thoracic Spine W Wo Contrast  Result Date: 02/19/2023 CLINICAL DATA:  Metastatic prostate cancer, low back pain EXAM: MRI THORACIC WITHOUT AND WITH CONTRAST TECHNIQUE: Multiplanar and multiecho pulse sequences of the thoracic spine were obtained without and with intravenous contrast. CONTRAST:  10mL GADAVIST GADOBUTROL 1 MMOL/ML IV SOLN COMPARISON:  None Available. FINDINGS: Alignment:  Physiologic. Vertebrae: No acute fracture. No discitis or osteomyelitis. Abnormal marrow lesion throughout the T12 vertebral body extending into the  left pedicle with an epidural soft tissue component extending along the roof of the T12-L1 neural foramen and along the medial aspect mildly flattening the left  lateral thecal sac. Mild narrowing of the left T12-L1 neural foramen. Abnormal bone lesion in the left posterior ninth rib consistent with metastatic disease. Cord:  Normal signal and morphology. Paraspinal and other soft tissues: No acute paraspinal abnormality. Disc levels: Disc spaces: Thoracic disc spaces are maintained. Degenerative disease with disc height loss at C5-6 and C6-7. T1-T2: No disc protrusion, foraminal stenosis or central canal stenosis. T2-T3: No disc protrusion, foraminal stenosis or central canal stenosis. T3-T4: No disc protrusion, foraminal stenosis or central canal stenosis. T4-T5: No disc protrusion, foraminal stenosis or central canal stenosis. T5-T6: No disc protrusion, foraminal stenosis or central canal stenosis. T6-T7: No disc protrusion, foraminal stenosis or central canal stenosis. T7-T8: No disc protrusion, foraminal stenosis or central canal stenosis. T8-T9: No disc protrusion, foraminal stenosis or central canal stenosis. T9-T10: No disc protrusion, foraminal stenosis or central canal stenosis. T10-T11: No disc protrusion, foraminal stenosis or central canal stenosis. T11-T12: No disc protrusion, foraminal stenosis or central canal stenosis. IMPRESSION: 1. Abnormal marrow lesion throughout the T12 vertebral body extending into the left pedicle with an epidural soft tissue component extending along the roof of the T12-L1 neural foramen and along the medial aspect mildly flattening the left lateral thecal sac. Mild narrowing of the left T12-L1 neural foramen. Findings are consistent with metastatic disease. 2. Abnormal bone lesion in the left posterior ninth rib consistent with metastatic disease. Electronically Signed   By: Elige Ko M.D.   On: 02/19/2023 14:58   MR Lumbar Spine W Wo Contrast  Result Date: 02/19/2023 CLINICAL DATA:  Metastatic prostate cancer. EXAM: MRI LUMBAR SPINE WITHOUT AND WITH CONTRAST TECHNIQUE: Multiplanar and multiecho pulse sequences of the lumbar  spine were obtained without and with intravenous contrast. CONTRAST:  10mL GADAVIST GADOBUTROL 1 MMOL/ML IV SOLN COMPARISON:  None Available. FINDINGS: Segmentation:  Standard. Alignment:  Physiologic. Vertebrae: No acute fracture or evidence of discitis-osteomyelitis. Abnormal marrow lesion throughout the T12 vertebral body consistent with metastatic disease extending into the left pedicle with a small epidural tumor component extending into the roof of the left T12-L1 neural foramen with foraminal stenosis. Abnormal marrow signal throughout the L2 vertebral body with extension into the left L2 pedicle consistent with metastatic disease. Conus medullaris and cauda equina: Conus extends to the T12 level. Conus and cauda equina appear normal. Paraspinal and other soft tissues: No acute paraspinal abnormality. Disc levels: Disc spaces: Degenerative disease with disc height loss L5-S1. Disc desiccation throughout the lumbar spine. T12-L1: No disc protrusion. No spinal stenosis. Mild bilateral facet arthropathy. No right foraminal stenosis. L1-L2: No significant disc bulge. Mild bilateral facet arthropathy. No foraminal or central canal stenosis. L2-L3: Mild broad-based disc bulge. Moderate bilateral facet arthropathy. No foraminal or central canal stenosis. L3-L4: Mild broad-based disc bulge. Mild bilateral facet arthropathy. No foraminal or central canal stenosis. L4-L5: Broad-based disc bulge. Right laminectomy. Mild bilateral facet arthropathy. No spinal stenosis. Mild bilateral foraminal stenosis. L5-S1: Mild broad-based disc bulge. Right laminectomy. Mild bilateral facet arthropathy. Moderate-severe bilateral foraminal stenosis. No spinal stenosis. IMPRESSION: 1. Abnormal marrow lesion throughout the T12 vertebral body consistent with metastatic disease extending into the left pedicle with small epidural tumor component extending into the roof of the left T12-L1 neural foramen with foraminal stenosis. 2. Abnormal  marrow signal throughout the L2 vertebral body with extension into the left L2 pedicle consistent with  metastatic disease. 3. Lumbar spine spondylosis as described above. Electronically Signed   By: Elige Ko M.D.   On: 02/19/2023 14:51   IR IMAGING GUIDED PORT INSERTION  Result Date: 02/19/2023 CLINICAL DATA:  Metastatic prostate carcinoma and need for porta cath for chemotherapy. EXAM: IMPLANTED PORT A CATH PLACEMENT WITH ULTRASOUND AND FLUOROSCOPIC GUIDANCE ANESTHESIA/SEDATION: Moderate (conscious) sedation was employed during this procedure. A total of Versed 2.0 mg and Fentanyl 100 mcg was administered intravenously by radiology nurse. Moderate Sedation Time: 22 minutes. The patient's level of consciousness and vital signs were monitored continuously by radiology nursing throughout the procedure under my direct supervision. FLUOROSCOPY: 30 seconds.  2.0 mGy. PROCEDURE: The procedure, risks, benefits, and alternatives were explained to the patient. Questions regarding the procedure were encouraged and answered. The patient understands and consents to the procedure. A time-out was performed prior to initiating the procedure. Ultrasound was utilized to confirm patency of the right internal jugular vein. Under ultrasound image was saved and recorded. The right neck and chest were prepped with chlorhexidine in a sterile fashion, and a sterile drape was applied covering the operative field. Maximum barrier sterile technique with sterile gowns and gloves were used for the procedure. Local anesthesia was provided with 1% lidocaine. After creating a small venotomy incision, a 21 gauge needle was advanced into the right internal jugular vein under direct, real-time ultrasound guidance. Ultrasound image documentation was performed. After securing guidewire access, an 8 Fr dilator was placed. A J-wire was kinked to measure appropriate catheter length. A subcutaneous port pocket was then created along the upper chest  wall utilizing sharp and blunt dissection. Portable cautery was utilized. The pocket was irrigated with sterile saline. A single lumen power injectable port was chosen for placement. The 8 Fr catheter was tunneled from the port pocket site to the venotomy incision. The port was placed in the pocket. External catheter was trimmed to appropriate length based on guidewire measurement. At the venotomy, an 8 Fr peel-away sheath was placed over a guidewire. The catheter was then placed through the sheath and the sheath removed. Final catheter positioning was confirmed and documented with a fluoroscopic spot image. The port was accessed with a needle and aspirated and flushed with heparinized saline. The access needle was removed. The venotomy and port pocket incisions were closed with subcutaneous 3-0 Monocryl and subcuticular 4-0 Vicryl. Dermabond was applied to both incisions. COMPLICATIONS: COMPLICATIONS None FINDINGS: After catheter placement, the tip lies at the cavo-atrial junction. The catheter aspirates normally and is ready for immediate use. IMPRESSION: Placement of single lumen port a cath via right internal jugular vein. The catheter tip lies at the cavo-atrial junction. A power injectable port a cath was placed and is ready for immediate use. Electronically Signed   By: Irish Lack M.D.   On: 02/19/2023 14:48   Ultrasound renal complete  Result Date: 02/18/2023 CLINICAL DATA:  Nephrolithiasis EXAM: RENAL / URINARY TRACT ULTRASOUND COMPLETE COMPARISON:  None Available. FINDINGS: Right Kidney: Length: 10.8 cm. Echogenicity within normal limits. No mass or hydronephrosis visualized. Left Kidney: Length: 9.7 cm. Echogenicity within normal limits. No mass or hydronephrosis visualized. Bladder: Appears normal for degree of bladder distention. IMPRESSION: Unremarkable examination. Electronically Signed   By: Layla Maw M.D.   On: 02/18/2023 16:44   DG C-Arm 1-60 Min-No Report  Result Date:  02/01/2023 Fluoroscopy was utilized by the requesting physician.  No radiographic interpretation.    Labs:  CBC: Recent Labs    01/01/23 1446 01/10/23  1224 02/15/23 1442 02/23/23 1248  WBC 6.6 6.8 6.9 6.6  HGB 12.7* 12.5* 13.1 12.8*  HCT 39.6 38.8* 40.2 40.0  PLT 215 241 231 221    COAGS: Recent Labs    02/23/23 1248  INR 1.1    BMP: Recent Labs    10/06/22 1157 01/01/23 1446 01/10/23 1224 02/15/23 1442  NA 142 139 136 137  K 3.7 3.7 3.5 3.2*  CL 110 106 107 106  CO2 25 23 20* 22  GLUCOSE 97 110* 118* 107*  BUN 6* 8 6* 8  CALCIUM 9.0 9.3 9.1 9.1  CREATININE 1.11 1.21 1.27* 1.18  GFRNONAA >60 >60 >60 >60    LIVER FUNCTION TESTS: Recent Labs    10/06/22 1157 01/01/23 1446 01/10/23 1224 02/15/23 1442  BILITOT 0.7 1.2 1.0 1.0  AST 17 20 29 22   ALT 11 21 25 20   ALKPHOS 70 75 74 89  PROT 6.8 7.6 7.0 7.9  ALBUMIN 3.9 4.2 4.1 4.2    TUMOR MARKERS: No results for input(s): "AFPTM", "CEA", "CA199", "CHROMGRNA" in the last 8760 hours.  Assessment and Plan: Patient with past medical history of metastatic prostate cancer presents with complaint of T12, L2 bone lesions.  IR consulted for biopsy at the request of Dr. Ellin Saba. Case reviewed by Dr. Tommie Sams who approves patient for procedure.  Patient presents today in their usual state of health.  He has been NPO and is not currently on blood thinners.   Risks and benefits of biopsy was discussed with the patient and/or patient's family including, but not limited to bleeding, infection, damage to adjacent structures or low yield requiring additional tests.  All of the questions were answered and there is agreement to proceed.  Consent signed and in chart.  Thank you for this interesting consult.  I greatly enjoyed meeting Tony Powell and look forward to participating in their care.  A copy of this report was sent to the requesting provider on this date.  Electronically Signed: Hoyt Koch, PA 02/23/2023, 1:45 PM   I spent a total of  30 Minutes   in face to face in clinical consultation, greater than 50% of which was counseling/coordinating care for T12, L2 lesions.

## 2023-02-23 NOTE — Progress Notes (Signed)
Late entry: Chemotherapy education packet given and discussed with patient and family in detail.  Discussed diagnosis and staging, treatment regimen, and intent of treatment. Reviewed chemotherapy medications and side effects, as well as pre-medications.  Instructed on how to manage side effect at home and when to call the clinic.  Importance of fever/chills discussed with patient and family.  Discussed precautions to implement at home after receiving treatment, as well as self care strategies.  Phone numbers provided for clinic during regular working hours, also how to reach the clinic after hours and on weekends.  Patient and family provided the opportunity to ask questions and all questions were answered to their satisfaction.  Consent obtain at this time.

## 2023-02-26 ENCOUNTER — Encounter: Payer: Medicare Other | Admitting: Dietician

## 2023-02-26 ENCOUNTER — Encounter: Payer: Self-pay | Admitting: Licensed Clinical Social Worker

## 2023-02-26 ENCOUNTER — Ambulatory Visit: Payer: Medicare Other | Admitting: Hematology

## 2023-02-26 ENCOUNTER — Ambulatory Visit: Payer: Medicare Other

## 2023-02-26 DIAGNOSIS — Z1379 Encounter for other screening for genetic and chromosomal anomalies: Secondary | ICD-10-CM | POA: Insufficient documentation

## 2023-02-27 LAB — SURGICAL PATHOLOGY

## 2023-02-28 ENCOUNTER — Ambulatory Visit: Payer: Medicare Other

## 2023-03-01 ENCOUNTER — Telehealth (INDEPENDENT_AMBULATORY_CARE_PROVIDER_SITE_OTHER): Payer: Self-pay | Admitting: Gastroenterology

## 2023-03-01 ENCOUNTER — Inpatient Hospital Stay: Payer: Medicare Other | Admitting: Licensed Clinical Social Worker

## 2023-03-01 DIAGNOSIS — C61 Malignant neoplasm of prostate: Secondary | ICD-10-CM

## 2023-03-01 MED ORDER — PEG 3350-KCL-NA BICARB-NACL 420 G PO SOLR: 4000.0000 mL | Freq: Once | ORAL | 0 refills | Status: AC

## 2023-03-01 NOTE — Telephone Encounter (Signed)
Pt was seen 02/06/23 and on encounter form it states to call pt in mid April to see if wanted to schedule EGD/TCS.   Contacted pt and pt scheduled TCS/EGD for 03/27/23. Pt preferred afternoon appt. Instructions printed and mailed to pt. Prep sent to pharmacy. Will contact pt with pre op appt.

## 2023-03-01 NOTE — Progress Notes (Signed)
[facility] CSW Progress Note  Clinical Social Worker  contacted pt's son [name] by phone.    Jamal informed Clinical research associate he was trying to apply pt for Medicaid and had some questions, but was able to complete the application in the interim and it has been submitted pending approval.  Jamal to pursue PCA services for pt if approved.  CSW to remain available as appropriate to provide support throughout duration of treatment.     Rachel Moulds, LCSW

## 2023-03-02 ENCOUNTER — Other Ambulatory Visit: Payer: Self-pay | Admitting: Hematology

## 2023-03-02 NOTE — Progress Notes (Signed)
I have talked to Dr. Jeannette How. He told that we can proceed with cycle 1 of docetaxel next Wednesday on 03/07/2023.  He will do simulation scans on 03/05/2023 and plan for palliative radiation to the T12 and L1 area the following week most likely 5 treatments.  He was informed by the patient that pain pills got wet.  We also discussed that adding steroids would be a reasonable option for pain control.

## 2023-03-07 ENCOUNTER — Other Ambulatory Visit: Payer: Self-pay | Admitting: Hematology

## 2023-03-07 ENCOUNTER — Inpatient Hospital Stay: Payer: Medicare Other

## 2023-03-07 VITALS — BP 144/98 | HR 77 | Temp 98.2°F | Resp 20

## 2023-03-07 DIAGNOSIS — C61 Malignant neoplasm of prostate: Secondary | ICD-10-CM

## 2023-03-07 DIAGNOSIS — Z95828 Presence of other vascular implants and grafts: Secondary | ICD-10-CM

## 2023-03-07 DIAGNOSIS — Z5111 Encounter for antineoplastic chemotherapy: Secondary | ICD-10-CM | POA: Diagnosis not present

## 2023-03-07 LAB — COMPREHENSIVE METABOLIC PANEL
ALT: 28 U/L (ref 0–44)
AST: 24 U/L (ref 15–41)
Albumin: 3.9 g/dL (ref 3.5–5.0)
Alkaline Phosphatase: 96 U/L (ref 38–126)
Anion gap: 9 (ref 5–15)
BUN: 7 mg/dL — ABNORMAL LOW (ref 8–23)
CO2: 23 mmol/L (ref 22–32)
Calcium: 9.2 mg/dL (ref 8.9–10.3)
Chloride: 105 mmol/L (ref 98–111)
Creatinine, Ser: 1.24 mg/dL (ref 0.61–1.24)
GFR, Estimated: >60 mL/min (ref >60)
Glucose, Bld: 109 mg/dL — ABNORMAL HIGH (ref 70–99)
Potassium: 3 mmol/L — ABNORMAL LOW (ref 3.5–5.1)
Sodium: 137 mmol/L (ref 135–145)
Total Bilirubin: 1.6 mg/dL — ABNORMAL HIGH (ref 0.3–1.2)
Total Protein: 7.4 g/dL (ref 6.5–8.1)

## 2023-03-07 LAB — CBC WITH DIFFERENTIAL/PLATELET
Abs Immature Granulocytes: 0.02 10*3/uL (ref 0.00–0.07)
Basophils Absolute: 0 10*3/uL (ref 0.0–0.1)
Basophils Relative: 1 %
Eosinophils Absolute: 0.1 10*3/uL (ref 0.0–0.5)
Eosinophils Relative: 2 %
HCT: 38.1 % — ABNORMAL LOW (ref 39.0–52.0)
Hemoglobin: 12.6 g/dL — ABNORMAL LOW (ref 13.0–17.0)
Immature Granulocytes: 0 %
Lymphocytes Relative: 15 %
Lymphs Abs: 1 10*3/uL (ref 0.7–4.0)
MCH: 27.5 pg (ref 26.0–34.0)
MCHC: 33.1 g/dL (ref 30.0–36.0)
MCV: 83 fL (ref 80.0–100.0)
Monocytes Absolute: 0.5 10*3/uL (ref 0.1–1.0)
Monocytes Relative: 8 %
Neutro Abs: 4.8 10*3/uL (ref 1.7–7.7)
Neutrophils Relative %: 74 %
Platelets: 225 10*3/uL (ref 150–400)
RBC: 4.59 MIL/uL (ref 4.22–5.81)
RDW: 14.6 % (ref 11.5–15.5)
WBC: 6.5 10*3/uL (ref 4.0–10.5)
nRBC: 0 % (ref 0.0–0.2)

## 2023-03-07 LAB — MAGNESIUM: Magnesium: 2 mg/dL (ref 1.7–2.4)

## 2023-03-07 MED ORDER — OXYCODONE HCL 5 MG PO TABS
10.0000 mg | ORAL_TABLET | Freq: Once | ORAL | Status: AC
  Administered 2023-03-07: 10 mg via ORAL
  Filled 2023-03-07: qty 2

## 2023-03-07 MED ORDER — PALONOSETRON HCL INJECTION 0.25 MG/5ML
0.2500 mg | Freq: Once | INTRAVENOUS | Status: AC
  Administered 2023-03-07: 0.25 mg via INTRAVENOUS
  Filled 2023-03-07: qty 5

## 2023-03-07 MED ORDER — SODIUM CHLORIDE 0.9% FLUSH
10.0000 mL | INTRAVENOUS | Status: DC | PRN
  Administered 2023-03-07: 10 mL

## 2023-03-07 MED ORDER — SODIUM CHLORIDE 0.9 % IV SOLN: Freq: Once | INTRAVENOUS | Status: AC

## 2023-03-07 MED ORDER — SODIUM CHLORIDE 0.9 % IV SOLN
10.0000 mg | Freq: Once | INTRAVENOUS | Status: AC
  Administered 2023-03-07: 10 mg via INTRAVENOUS
  Filled 2023-03-07: qty 10

## 2023-03-07 MED ORDER — HEPARIN SOD (PORK) LOCK FLUSH 100 UNIT/ML IV SOLN
500.0000 [IU] | Freq: Once | INTRAVENOUS | Status: AC | PRN
  Administered 2023-03-07: 500 [IU]

## 2023-03-07 MED ORDER — POTASSIUM CHLORIDE CRYS ER 20 MEQ PO TBCR
40.0000 meq | EXTENDED_RELEASE_TABLET | Freq: Once | ORAL | Status: AC
  Administered 2023-03-07: 40 meq via ORAL
  Filled 2023-03-07: qty 2

## 2023-03-07 MED ORDER — SODIUM CHLORIDE 0.9 % IV SOLN
75.0000 mg/m2 | Freq: Once | INTRAVENOUS | Status: AC
  Administered 2023-03-07: 160 mg via INTRAVENOUS
  Filled 2023-03-07: qty 16

## 2023-03-07 MED ORDER — OXYCODONE HCL 5 MG PO TABS: 10.0000 mg | ORAL_TABLET | Freq: Four times a day (QID) | ORAL | 0 refills | Status: DC | PRN

## 2023-03-07 NOTE — Progress Notes (Signed)
Ok to treat with elevated tBili.  Pharmacist Chemotherapy Monitoring - Initial Assessment    Anticipated start date: 03/07/23    The following has been reviewed per standard work regarding the patient's treatment regimen: The patient's diagnosis, treatment plan and drug doses, and organ/hematologic function Lab orders and baseline tests specific to treatment regimen  The treatment plan start date, drug sequencing, and pre-medications Prior authorization status  Patient's documented medication list, including drug-drug interaction screen and prescriptions for anti-emetics and supportive care specific to the treatment regimen The drug concentrations, fluid compatibility, administration routes, and timing of the medications to be used The patient's access for treatment and lifetime cumulative dose history, if applicable  The patient's medication allergies and previous infusion related reactions, if applicable   Changes made to treatment plan:  switch to insurance preferred biosimilar - Stimufend  Follow up needed:  N/A   Tony Powell, [facility], 03/07/2023  9:39 AM   T.O. Dr Carilyn Goodpasture, PharmD

## 2023-03-07 NOTE — Addendum Note (Signed)
Addended by: Vergia Alcon on: 03/07/2023 08:49 AM   Modules accepted: Orders

## 2023-03-07 NOTE — Progress Notes (Signed)
Patient reported that his pain medication got wet and stuck together and got bad, hence he could not take them.  I have given another refill of his oxycodone today.

## 2023-03-07 NOTE — Patient Instructions (Signed)
[facility] [place]  Discharge Instructions: Thank you for choosing [facility] to provide your oncology and hematology care.  If you have a lab appointment with the Cancer Center - please note that after April 8th, 2024, all labs will be drawn in the cancer center.  You do not have to check in or register with the main entrance as you have in the past but will complete your check-in in the cancer center.  Wear comfortable clothing and clothing appropriate for easy access to any Portacath or PICC line.   We strive to give you quality time with your provider. You may need to reschedule your appointment if you arrive late (15 or more minutes).  Arriving late affects you and other patients whose appointments are after yours.  Also, if you miss three or more appointments without notifying the office, you may be dismissed from the clinic at the provider's discretion.      For prescription refill requests, have your pharmacy contact our office and allow 72 hours for refills to be completed.    Today you received the following chemotherapy and/or immunotherapy agents Taxotere   To help prevent nausea and vomiting after your treatment, we encourage you to take your nausea medication as directed.  BELOW ARE SYMPTOMS THAT SHOULD BE REPORTED IMMEDIATELY: *FEVER GREATER THAN 100.4 F (38 C) OR HIGHER *CHILLS OR SWEATING *NAUSEA AND VOMITING THAT IS NOT CONTROLLED WITH YOUR NAUSEA MEDICATION *UNUSUAL SHORTNESS OF BREATH *UNUSUAL BRUISING OR BLEEDING *URINARY PROBLEMS (pain or burning when urinating, or frequent urination) *BOWEL PROBLEMS (unusual diarrhea, constipation, pain near the anus) TENDERNESS IN MOUTH AND THROAT WITH OR WITHOUT PRESENCE OF ULCERS (sore throat, sores in mouth, or a toothache) UNUSUAL RASH, SWELLING OR PAIN  UNUSUAL VAGINAL DISCHARGE OR ITCHING   Items with * indicate a potential emergency and should be followed up as soon as possible or go to the  Emergency Department if any problems should occur.  Please show the CHEMOTHERAPY ALERT CARD or IMMUNOTHERAPY ALERT CARD at check-in to the Emergency Department and triage nurse.  Should you have questions after your visit or need to cancel or reschedule your appointment, please contact [facility] [place] [phone]  and follow the prompts.  Office hours are 8:00 a.m. to 4:30 p.m. Monday - Friday. Please note that voicemails left after 4:00 p.m. may not be returned until the following business day.  We are closed weekends and major holidays. You have access to a nurse at all times for urgent questions. Please call the main number to the clinic [phone] and follow the prompts.  For any non-urgent questions, you may also contact your provider using MyChart. We now offer e-Visits for anyone 18 and older to request care online for non-urgent symptoms. For details visit [email or web address].   Also download the MyChart app! Go to the app store, search "MyChart", open the app, select , and log in with your MyChart username and password.   

## 2023-03-07 NOTE — Progress Notes (Signed)
Patient presents today for D1C1 Taxotere infusion per providers order.  Vital signs within parameters for treatment.  Labs pending.  Patient complains of severe pain and is requesting pain medication.  He also says that he has been without pain medication for 2 weeks due to his pills becoming wet and sticking together (ruined), MD notified.  Message received from Dr. Ellin Saba to give the patient Oxycodone IR 10 mg X 1 PO for pain.  Patient instructed per Dr. Ellin Saba to start taking Nubeqa today.  Labs reviewed and Total Bili noted to be 1.6, MD notified.  Message received from Dr. Ellin Saba okay to proceed with treatment.  Treatment given today per MD orders.  Stable during infusion without adverse affects.  Vital signs stable.  No complaints at this time.  Discharge from clinic via wheelchair in stable condition.  Alert and oriented X 3.  Follow up with [facility] as scheduled.

## 2023-03-08 NOTE — Progress Notes (Signed)
24 HOUR POST CHEMOTHERAPY CALL BACK:  Left message on voicemail for patient to return my call.

## 2023-03-09 ENCOUNTER — Inpatient Hospital Stay: Payer: Medicare Other

## 2023-03-09 VITALS — BP 127/76 | HR 109 | Temp 99.6°F | Resp 19

## 2023-03-09 DIAGNOSIS — Z5111 Encounter for antineoplastic chemotherapy: Secondary | ICD-10-CM | POA: Diagnosis not present

## 2023-03-09 DIAGNOSIS — C61 Malignant neoplasm of prostate: Secondary | ICD-10-CM

## 2023-03-09 DIAGNOSIS — Z95828 Presence of other vascular implants and grafts: Secondary | ICD-10-CM

## 2023-03-09 MED ORDER — PEGFILGRASTIM-FPGK 6 MG/0.6ML ~~LOC~~ SOSY
6.0000 mg | PREFILLED_SYRINGE | Freq: Once | SUBCUTANEOUS | Status: AC
  Administered 2023-03-09: 6 mg via SUBCUTANEOUS
  Filled 2023-03-09: qty 0.6

## 2023-03-09 NOTE — Progress Notes (Signed)
Patient tolerated Stimufend injection with no complaints voiced.  Site clean and dry with no bruising or swelling noted.  No complaints of pain.  Discharged with vital signs stable and no signs or symptoms of distress noted.

## 2023-03-09 NOTE — Patient Instructions (Signed)
[facility]  Discharge Instructions: Thank you for choosing [facility] to provide your oncology and hematology care.  If you have a lab appointment with the Cancer Center - please note that after April 8th, 2024, all labs will be drawn in the cancer center.  You do not have to check in or register with the main entrance as you have in the past but will complete your check-in in the cancer center.  Wear comfortable clothing and clothing appropriate for easy access to any Portacath or PICC line.   We strive to give you quality time with your provider. You may need to reschedule your appointment if you arrive late (15 or more minutes).  Arriving late affects you and other patients whose appointments are after yours.  Also, if you miss three or more appointments without notifying the office, you may be dismissed from the clinic at the provider's discretion.      For prescription refill requests, have your pharmacy contact our office and allow 72 hours for refills to be completed.    Today you received the following Stimufend injection.    Pegfilgrastim Injection What is this medication? PEGFILGRASTIM (PEG fil gra stim) lowers the risk of infection in people who are receiving chemotherapy. It works by Systems analyst make more white blood cells, which protects your body from infection. It may also be used to help people who have been exposed to high doses of radiation. This medicine may be used for other purposes; ask your health care provider or pharmacist if you have questions. COMMON BRAND NAME(S): Cherly Hensen, Neulasta, Nyvepria, Stimufend, UDENYCA, Ziextenzo What should I tell my care team before I take this medication? They need to know if you have any of these conditions: Kidney disease Latex allergy Ongoing radiation therapy Sickle cell disease Skin reactions to acrylic adhesives (On-Body Injector only) An unusual or allergic reaction to  pegfilgrastim, filgrastim, other medications, foods, dyes, or preservatives Pregnant or trying to get pregnant Breast-feeding How should I use this medication? This medication is for injection under the skin. If you get this medication at home, you will be taught how to prepare and give the pre-filled syringe or how to use the On-body Injector. Refer to the patient Instructions for Use for detailed instructions. Use exactly as directed. Tell your care team immediately if you suspect that the On-body Injector may not have performed as intended or if you suspect the use of the On-body Injector resulted in a missed or partial dose. It is important that you put your used needles and syringes in a special sharps container. Do not put them in a trash can. If you do not have a sharps container, call your pharmacist or care team to get one. Talk to your care team about the use of this medication in children. While this medication may be prescribed for selected conditions, precautions do apply. Overdosage: If you think you have taken too much of this medicine contact a poison control center or emergency room at once. NOTE: This medicine is only for you. Do not share this medicine with others. What if I miss a dose? It is important not to miss your dose. Call your care team if you miss your dose. If you miss a dose due to an On-body Injector failure or leakage, a new dose should be administered as soon as possible using a single prefilled syringe for manual use. What may interact with this medication? Interactions have not been  studied. This list may not describe all possible interactions. Give your health care provider a list of all the medicines, herbs, non-prescription drugs, or dietary supplements you use. Also tell them if you smoke, drink alcohol, or use illegal drugs. Some items may interact with your medicine. What should I watch for while using this medication? Your condition will be monitored  carefully while you are receiving this medication. You may need blood work done while you are taking this medication. Talk to your care team about your risk of cancer. You may be more at risk for certain types of cancer if you take this medication. If you are going to need a MRI, CT scan, or other procedure, tell your care team that you are using this medication (On-Body Injector only). What side effects may I notice from receiving this medication? Side effects that you should report to your care team as soon as possible: Allergic reactions--skin rash, itching, hives, swelling of the face, lips, tongue, or throat Capillary leak syndrome--stomach or muscle pain, unusual weakness or fatigue, feeling faint or lightheaded, decrease in the amount of urine, swelling of the ankles, hands, or feet, trouble breathing High white blood cell level--fever, fatigue, trouble breathing, night sweats, change in vision, weight loss Inflammation of the aorta--fever, fatigue, back, chest, or stomach pain, severe headache Kidney injury (glomerulonephritis)--decrease in the amount of urine, red or dark brown urine, foamy or bubbly urine, swelling of the ankles, hands, or feet Shortness of breath or trouble breathing Spleen injury--pain in upper left stomach or shoulder Unusual bruising or bleeding Side effects that usually do not require medical attention (report to your care team if they continue or are bothersome): Bone pain Pain in the hands or feet This list may not describe all possible side effects. Call your doctor for medical advice about side effects. You may report side effects to FDA at 1-800-FDA-1088. Where should I keep my medication? Keep out of the reach of children. If you are using this medication at home, you will be instructed on how to store it. Throw away any unused medication after the expiration date on the label. NOTE: This sheet is a summary. It may not cover all possible information. If you  have questions about this medicine, talk to your doctor, pharmacist, or health care provider.  2023 Elsevier/Gold Standard (2021-07-15 00:00:00)  To help prevent nausea and vomiting after your treatment, we encourage you to take your nausea medication as directed.  BELOW ARE SYMPTOMS THAT SHOULD BE REPORTED IMMEDIATELY: *FEVER GREATER THAN 100.4 F (38 C) OR HIGHER *CHILLS OR SWEATING *NAUSEA AND VOMITING THAT IS NOT CONTROLLED WITH YOUR NAUSEA MEDICATION *UNUSUAL SHORTNESS OF BREATH *UNUSUAL BRUISING OR BLEEDING *URINARY PROBLEMS (pain or burning when urinating, or frequent urination) *BOWEL PROBLEMS (unusual diarrhea, constipation, pain near the anus) TENDERNESS IN MOUTH AND THROAT WITH OR WITHOUT PRESENCE OF ULCERS (sore throat, sores in mouth, or a toothache) UNUSUAL RASH, SWELLING OR PAIN  UNUSUAL VAGINAL DISCHARGE OR ITCHING   Items with * indicate a potential emergency and should be followed up as soon as possible or go to the Emergency Department if any problems should occur.  Please show the CHEMOTHERAPY ALERT CARD or IMMUNOTHERAPY ALERT CARD at check-in to the Emergency Department and triage nurse.  Should you have questions after your visit or need to cancel or reschedule your appointment, please contact [facility] [phone]  and follow the prompts.  Office hours are 8:00 a.m. to 4:30 p.m. Monday - Friday.  Please note that voicemails left after 4:00 p.m. may not be returned until the following business day.  We are closed weekends and major holidays. You have access to a nurse at all times for urgent questions. Please call the main number to the clinic [phone] and follow the prompts.  For any non-urgent questions, you may also contact your provider using MyChart. We now offer e-Visits for anyone 37 and older to request care online for non-urgent symptoms. For details visit [email or web address].   Also download the MyChart app! Go to the app store,  search "MyChart", open the app, select [facility], and log in with your MyChart username and password.

## 2023-03-09 NOTE — Progress Notes (Signed)
24 HOUR POST CHEMOTHERAPY CALL BACK:  Patient stated that he tolerated treatment well.  He denied any side effects or new symptoms.  Advised to call the office with any new concerns.

## 2023-03-13 ENCOUNTER — Other Ambulatory Visit: Payer: Self-pay

## 2023-03-13 DIAGNOSIS — C61 Malignant neoplasm of prostate: Secondary | ICD-10-CM

## 2023-03-14 ENCOUNTER — Telehealth (INDEPENDENT_AMBULATORY_CARE_PROVIDER_SITE_OTHER): Payer: Self-pay | Admitting: Gastroenterology

## 2023-03-14 NOTE — Telephone Encounter (Signed)
Agree with Tony Powell, hopefully before he starts radiation.  We can give him some Zofran to take on the day of his prep.

## 2023-03-14 NOTE — Telephone Encounter (Signed)
Contacted pt to let him know of pre op appt for upcoming EGD/TCS. Reached son [name].   [name] states that pt began chemo last week. He is taking an injection every 21 days and a chemo pill daily. Pt will begin radiation also but son is waiting on the call to schedule radiation. Son is wanting to know if we should hold off on procedures at this time. Please advise. Thank you

## 2023-03-14 NOTE — Telephone Encounter (Signed)
Thanks

## 2023-03-14 NOTE — Telephone Encounter (Signed)
Spoke with son and informed him of doctor recommendations. Pt son has decided to keep appt for now but will call if anything changes with radiation. Gave pt son pre op appt.

## 2023-03-15 ENCOUNTER — Ambulatory Visit: Payer: Medicare Other

## 2023-03-15 ENCOUNTER — Inpatient Hospital Stay: Payer: Medicare Other | Admitting: Licensed Clinical Social Worker

## 2023-03-15 ENCOUNTER — Inpatient Hospital Stay: Payer: Medicare Other | Attending: Hematology

## 2023-03-15 ENCOUNTER — Other Ambulatory Visit (HOSPITAL_COMMUNITY): Payer: Self-pay

## 2023-03-15 ENCOUNTER — Inpatient Hospital Stay: Payer: Medicare Other

## 2023-03-15 ENCOUNTER — Inpatient Hospital Stay (HOSPITAL_BASED_OUTPATIENT_CLINIC_OR_DEPARTMENT_OTHER): Payer: Medicare Other | Admitting: Physician Assistant

## 2023-03-15 VITALS — BP 193/98 | HR 95 | Temp 96.7°F | Resp 20

## 2023-03-15 DIAGNOSIS — Z5111 Encounter for antineoplastic chemotherapy: Secondary | ICD-10-CM

## 2023-03-15 DIAGNOSIS — Z87891 Personal history of nicotine dependence: Secondary | ICD-10-CM | POA: Diagnosis not present

## 2023-03-15 DIAGNOSIS — I10 Essential (primary) hypertension: Secondary | ICD-10-CM | POA: Diagnosis not present

## 2023-03-15 DIAGNOSIS — Z800 Family history of malignant neoplasm of digestive organs: Secondary | ICD-10-CM | POA: Insufficient documentation

## 2023-03-15 DIAGNOSIS — R1115 Cyclical vomiting syndrome unrelated to migraine: Secondary | ICD-10-CM | POA: Diagnosis not present

## 2023-03-15 DIAGNOSIS — N3289 Other specified disorders of bladder: Secondary | ICD-10-CM | POA: Diagnosis not present

## 2023-03-15 DIAGNOSIS — M5136 Other intervertebral disc degeneration, lumbar region: Secondary | ICD-10-CM | POA: Insufficient documentation

## 2023-03-15 DIAGNOSIS — M549 Dorsalgia, unspecified: Secondary | ICD-10-CM | POA: Insufficient documentation

## 2023-03-15 DIAGNOSIS — R300 Dysuria: Secondary | ICD-10-CM

## 2023-03-15 DIAGNOSIS — C61 Malignant neoplasm of prostate: Secondary | ICD-10-CM

## 2023-03-15 DIAGNOSIS — R5383 Other fatigue: Secondary | ICD-10-CM | POA: Insufficient documentation

## 2023-03-15 DIAGNOSIS — Z79633 Long term (current) use of mitotic inhibitor: Secondary | ICD-10-CM | POA: Insufficient documentation

## 2023-03-15 DIAGNOSIS — Z79899 Other long term (current) drug therapy: Secondary | ICD-10-CM | POA: Diagnosis not present

## 2023-03-15 DIAGNOSIS — Z923 Personal history of irradiation: Secondary | ICD-10-CM | POA: Diagnosis not present

## 2023-03-15 DIAGNOSIS — M50322 Other cervical disc degeneration at C5-C6 level: Secondary | ICD-10-CM | POA: Diagnosis not present

## 2023-03-15 DIAGNOSIS — R112 Nausea with vomiting, unspecified: Secondary | ICD-10-CM

## 2023-03-15 DIAGNOSIS — Z95828 Presence of other vascular implants and grafts: Secondary | ICD-10-CM

## 2023-03-15 DIAGNOSIS — C7951 Secondary malignant neoplasm of bone: Secondary | ICD-10-CM | POA: Diagnosis not present

## 2023-03-15 DIAGNOSIS — R634 Abnormal weight loss: Secondary | ICD-10-CM

## 2023-03-15 DIAGNOSIS — M47816 Spondylosis without myelopathy or radiculopathy, lumbar region: Secondary | ICD-10-CM | POA: Diagnosis not present

## 2023-03-15 DIAGNOSIS — Z886 Allergy status to analgesic agent status: Secondary | ICD-10-CM | POA: Diagnosis not present

## 2023-03-15 DIAGNOSIS — E785 Hyperlipidemia, unspecified: Secondary | ICD-10-CM | POA: Insufficient documentation

## 2023-03-15 DIAGNOSIS — Z809 Family history of malignant neoplasm, unspecified: Secondary | ICD-10-CM | POA: Insufficient documentation

## 2023-03-15 DIAGNOSIS — R7989 Other specified abnormal findings of blood chemistry: Secondary | ICD-10-CM | POA: Diagnosis not present

## 2023-03-15 DIAGNOSIS — Z8042 Family history of malignant neoplasm of prostate: Secondary | ICD-10-CM | POA: Insufficient documentation

## 2023-03-15 DIAGNOSIS — Z803 Family history of malignant neoplasm of breast: Secondary | ICD-10-CM | POA: Diagnosis not present

## 2023-03-15 DIAGNOSIS — M5137 Other intervertebral disc degeneration, lumbosacral region: Secondary | ICD-10-CM | POA: Diagnosis not present

## 2023-03-15 DIAGNOSIS — K5900 Constipation, unspecified: Secondary | ICD-10-CM | POA: Diagnosis not present

## 2023-03-15 LAB — CBC WITH DIFFERENTIAL/PLATELET
Abs Immature Granulocytes: 0.8 10*3/uL — ABNORMAL HIGH (ref 0.00–0.07)
Band Neutrophils: 4 %
Basophils Absolute: 0 10*3/uL (ref 0.0–0.1)
Basophils Relative: 0 %
Eosinophils Absolute: 0 10*3/uL (ref 0.0–0.5)
Eosinophils Relative: 0 %
HCT: 36.5 % — ABNORMAL LOW (ref 39.0–52.0)
Hemoglobin: 12.4 g/dL — ABNORMAL LOW (ref 13.0–17.0)
Lymphocytes Relative: 7 %
Lymphs Abs: 1.4 10*3/uL (ref 0.7–4.0)
MCH: 27.1 pg (ref 26.0–34.0)
MCHC: 34 g/dL (ref 30.0–36.0)
MCV: 79.9 fL — ABNORMAL LOW (ref 80.0–100.0)
Metamyelocytes Relative: 4 %
Monocytes Absolute: 0.8 10*3/uL (ref 0.1–1.0)
Monocytes Relative: 4 %
Neutro Abs: 17.1 10*3/uL — ABNORMAL HIGH (ref 1.7–7.7)
Neutrophils Relative %: 81 %
Platelets: 271 10*3/uL (ref 150–400)
RBC: 4.57 MIL/uL (ref 4.22–5.81)
RDW: 14.6 % (ref 11.5–15.5)
WBC: 20.1 10*3/uL — ABNORMAL HIGH (ref 4.0–10.5)
nRBC: 0.1 % (ref 0.0–0.2)

## 2023-03-15 LAB — COMPREHENSIVE METABOLIC PANEL
ALT: 19 U/L (ref 0–44)
AST: 22 U/L (ref 15–41)
Albumin: 3.6 g/dL (ref 3.5–5.0)
Alkaline Phosphatase: 140 U/L — ABNORMAL HIGH (ref 38–126)
Anion gap: 15 (ref 5–15)
BUN: 19 mg/dL (ref 8–23)
CO2: 21 mmol/L — ABNORMAL LOW (ref 22–32)
Calcium: 9.1 mg/dL (ref 8.9–10.3)
Chloride: 95 mmol/L — ABNORMAL LOW (ref 98–111)
Creatinine, Ser: 1.64 mg/dL — ABNORMAL HIGH (ref 0.61–1.24)
GFR, Estimated: 44 mL/min — ABNORMAL LOW (ref >60)
Glucose, Bld: 180 mg/dL — ABNORMAL HIGH (ref 70–99)
Potassium: 3.3 mmol/L — ABNORMAL LOW (ref 3.5–5.1)
Sodium: 131 mmol/L — ABNORMAL LOW (ref 135–145)
Total Bilirubin: 1.4 mg/dL — ABNORMAL HIGH (ref 0.3–1.2)
Total Protein: 8 g/dL (ref 6.5–8.1)

## 2023-03-15 LAB — MAGNESIUM: Magnesium: 2.1 mg/dL (ref 1.7–2.4)

## 2023-03-15 MED ORDER — SODIUM CHLORIDE 0.9% FLUSH
10.0000 mL | INTRAVENOUS | Status: DC | PRN
  Administered 2023-03-15: 10 mL via INTRAVENOUS

## 2023-03-15 MED ORDER — ONDANSETRON HCL 4 MG/2ML IJ SOLN
8.0000 mg | Freq: Once | INTRAMUSCULAR | Status: AC
  Administered 2023-03-15: 8 mg via INTRAVENOUS
  Filled 2023-03-15: qty 4

## 2023-03-15 MED ORDER — HEPARIN SOD (PORK) LOCK FLUSH 100 UNIT/ML IV SOLN
500.0000 [IU] | Freq: Once | INTRAVENOUS | Status: AC
  Administered 2023-03-15: 500 [IU] via INTRAVENOUS

## 2023-03-15 MED ORDER — MAGNESIUM CITRATE PO SOLN: 1.0000 | Freq: Once | ORAL | 2 refills | Status: AC

## 2023-03-15 MED ORDER — ONDANSETRON 8 MG PO TBDP: 8.0000 mg | ORAL_TABLET | Freq: Three times a day (TID) | ORAL | 0 refills | Status: DC | PRN

## 2023-03-15 MED ORDER — SODIUM CHLORIDE 0.9 % IV SOLN: INTRAVENOUS | Status: AC

## 2023-03-15 MED ORDER — SODIUM CHLORIDE 0.9% FLUSH
10.0000 mL | Freq: Once | INTRAVENOUS | Status: AC
  Administered 2023-03-15: 10 mL via INTRAVENOUS

## 2023-03-15 NOTE — Patient Instructions (Signed)
[facility] [place]  Discharge Instructions: Thank you for choosing [facility] to provide your oncology and hematology care.  If you have a lab appointment with the Cancer Center - please note that after April 8th, 2024, all labs will be drawn in the cancer center.  You do not have to check in or register with the main entrance as you have in the past but will complete your check-in in the cancer center.  Wear comfortable clothing and clothing appropriate for easy access to any Portacath or PICC line.   We strive to give you quality time with your provider. You may need to reschedule your appointment if you arrive late (15 or more minutes).  Arriving late affects you and other patients whose appointments are after yours.  Also, if you miss three or more appointments without notifying the office, you may be dismissed from the clinic at the provider's discretion.      For prescription refill requests, have your pharmacy contact our office and allow 72 hours for refills to be completed.  To help prevent nausea and vomiting after your treatment, we encourage you to take your nausea medication as directed.  BELOW ARE SYMPTOMS THAT SHOULD BE REPORTED IMMEDIATELY: *FEVER GREATER THAN 100.4 F (38 C) OR HIGHER *CHILLS OR SWEATING *NAUSEA AND VOMITING THAT IS NOT CONTROLLED WITH YOUR NAUSEA MEDICATION *UNUSUAL SHORTNESS OF BREATH *UNUSUAL BRUISING OR BLEEDING *URINARY PROBLEMS (pain or burning when urinating, or frequent urination) *BOWEL PROBLEMS (unusual diarrhea, constipation, pain near the anus) TENDERNESS IN MOUTH AND THROAT WITH OR WITHOUT PRESENCE OF ULCERS (sore throat, sores in mouth, or a toothache) UNUSUAL RASH, SWELLING OR PAIN  UNUSUAL VAGINAL DISCHARGE OR ITCHING   Items with * indicate a potential emergency and should be followed up as soon as possible or go to the Emergency Department if any problems should occur.  Please show the CHEMOTHERAPY ALERT CARD or  IMMUNOTHERAPY ALERT CARD at check-in to the Emergency Department and triage nurse.  Should you have questions after your visit or need to cancel or reschedule your appointment, please contact [facility] AT  [phone]  and follow the prompts.  Office hours are 8:00 a.m. to 4:30 p.m. Monday - Friday. Please note that voicemails left after 4:00 p.m. may not be returned until the following business day.  We are closed weekends and major holidays. You have access to a nurse at all times for urgent questions. Please call the main number to the clinic [phone] and follow the prompts.  For any non-urgent questions, you may also contact your provider using MyChart. We now offer e-Visits for anyone 18 and older to request care online for non-urgent symptoms. For details visit [email or web address].   Also download the MyChart app! Go to the app store, search "MyChart", open the app, select [place], and log in with your MyChart username and password.   

## 2023-03-15 NOTE — Progress Notes (Signed)
Patient presents today for IVF.  We will give NS 1000 mL over one hour per Georga Kaufmann, PA-C.  Patient is c/o dysuria.  Vital signs are stable.  We will collect a UA/UC today as well.  We will proceed with IVF per provider orders.   Patient tolerated IVF well with no complaints voiced.  Patient left via wheelchair with son in stable condition.  Patient was unable to collect urine sample today.  Son will bring it back in the morning.  Provider aware.  Vital signs stable at discharge.  Follow up as scheduled.

## 2023-03-15 NOTE — Progress Notes (Signed)
[facility] [address] [place] [address]   Clinic Day:  03/15/2023  Referring physician: Tommie Sams, DO  Patient Care Team: Tommie Sams, DO as PCP - General (Family Medicine) Doreatha Massed, MD as Medical Oncologist (Medical Oncology) Therese Sarah, RN as Oncology Nurse Navigator (Medical Oncology)   CHIEF COMPLAINT/PURPOSE OF CONSULT:   Diagnosis: metastatic prostate cancer   Cancer Staging  Prostate cancer [facility]) Staging form: Prostate, AJCC 8th Edition - Clinical stage from 08/25/2020: Stage IVB (cT1c, cN1, cM1c, PSA: 5.4, Grade Group: 5) - Unsigned    Prior Therapy: XRT to prostate/pelvic nodes 11/16/20 - 01/14/21  Current Therapy:  ADT, Docetaxel q 21 days.    HISTORY OF PRESENT ILLNESS:   Oncology History  Prostate cancer (HCC)  08/30/2020 Initial Diagnosis   Prostate cancer   03/07/2023 -  Chemotherapy   Patient is on Treatment Plan : PROSTATE Docetaxel (75) q21d      Genetic Testing   No pathogenic variants identified on the [facility] Multi-Cancer+RNA panel. VUS in HOXB13 called c.314C>T and VUS in PMS2 called c.682G>A identified. The report date is 02/24/2023.  The Multi-Cancer + RNA Panel offered by [facility] includes sequencing and/or deletion/duplication analysis of the following 70 genes:  AIP*, ALK, APC*, ATM*, AXIN2*, BAP1*, BARD1*, BLM*, BMPR1A*, BRCA1*, BRCA2*, BRIP1*, CDC73*, CDH1*, CDK4, CDKN1B*, CDKN2A, CHEK2*, CTNNA1*, DICER1*, EPCAM, EGFR, FH*, FLCN*, GREM1, HOXB13, KIT, LZTR1, MAX*, MBD4, MEN1*, MET, MITF, MLH1*, MSH2*, MSH3*, MSH6*, MUTYH*, NF1*, NF2*, NTHL1*, PALB2*, PDGFRA, PMS2*, POLD1*, POLE*, POT1*, PRKAR1A*, PTCH1*, PTEN*, RAD51C*, RAD51D*, RB1*, RET, SDHA*, SDHAF2*, SDHB*, SDHC*, SDHD*, SMAD4*, SMARCA4*, SMARCB1*, SMARCE1*, STK11*, SUFU*, TMEM127*, TP53*, TSC1*, TSC2*, VHL*. RNA analysis is performed for * genes.       Tony Powell is a 72 y.o. male presenting to clinic today for a toxicity evaluation after starting  docetaxel therapy on 03/07/2023. He is accompanied by his son for this visit.   He is extremely fatigued and lethargic. He has no energy and very poor appetite. He has lost approximately 6 lbs since 03/07/2023. Since he has not been eating regularly, he has not been taking his darolutamide as prescribed.  He reports persistent nausea with vomiting. He is taking compazine every 6 hours as needed but was not certain if he should take it scheduled. He reports constipation with his last bowel movement almost two weeks ago. He tried to take some of the colonoscopy prep but he vomited. He reports ongoing urinary symptoms with burning with urination and noticing some clots. He denies fevers, chills, sweats, shortness of breath, chest pain or cough. He has no other complaints  PAST MEDICAL HISTORY:   Past Medical History: Past Medical History:  Diagnosis Date   Abnormal stress test    Borderline hypertension    Chest discomfort    Atypical   DJD (degenerative joint disease)    Knees, lumbar sacral spine   Hyperlipidemia    Hypertension    Port-A-Cath in place 02/22/2023   Prostate cancer [facility])     Surgical History: Past Surgical History:  Procedure Laterality Date   BACK SURGERY     CYSTOSCOPY WITH FULGERATION  04/18/2021   Procedure: CYSTOSCOPY WITH FULGERATION of BLADDER NECK;  Surgeon: Malen Gauze, MD;  Location: [facility];  Service: Urology;;   CYSTOSCOPY WITH INJECTION N/A 04/18/2021   Procedure: CYSTOSCOPY WITH INJECTION- Botox 100 units;  Surgeon: Malen Gauze, MD;  Location: [facility];  Service: Urology;  Laterality: N/A;   CYSTOSCOPY WITH  RETROGRADE PYELOGRAM, URETEROSCOPY AND STENT PLACEMENT Left 02/01/2023   Procedure: CYSTOSCOPY WITH RETROGRADE PYELOGRAM, URETEROSCOPY AND STENT PLACEMENT;  Surgeon: Malen Gauze, MD;  Location: [facility];  Service: Urology;  Laterality: Left;   IR FLUORO GUIDED NEEDLE PLC ASPIRATION/INJECTION LOC  02/23/2023   IR IMAGING GUIDED PORT INSERTION   02/19/2023   STONE EXTRACTION WITH BASKET Left 02/01/2023   Procedure: STONE EXTRACTION WITH BASKET;  Surgeon: Malen Gauze, MD;  Location: [facility];  Service: Urology;  Laterality: Left;   TRANSURETHRAL RESECTION OF BLADDER TUMOR N/A 08/25/2020   Procedure: TRANSURETHRAL RESECTION OF BLADDER TUMOR (TURBT);  Surgeon: Malen Gauze, MD;  Location: [facility];  Service: Urology;  Laterality: N/A;    Social History: Social History   Socioeconomic History   Marital status: Divorced    Spouse name: Not on file   Number of children: 2   Years of education: Not on file   Highest education level: Not on file  Occupational History   Occupation: Long distance Scientist, water quality: [employer]  Tobacco Use   Smoking status: Former    Packs/day: 1.00    Years: 20.00    Additional pack years: 0.00    Total pack years: 20.00    Types: Cigarettes    Quit date: 11/13/1998    Years since quitting: 24.3    Passive exposure: Current   Smokeless tobacco: Never  Vaping Use   Vaping Use: Never used  Substance and Sexual Activity   Alcohol use: No   Drug use: Never   Sexual activity: Not Currently  Other Topics Concern   Not on file  Social History Narrative   Divorced   Social Determinants of Health   Financial Resource Strain: Low Risk  (09/14/2022)   Overall Financial Resource Strain (CARDIA)    Difficulty of Paying Living Expenses: Not hard at all  Food Insecurity: No Food Insecurity (02/15/2023)   Hunger Vital Sign    Worried About Running Out of Food in the Last Year: Never true    Ran Out of Food in the Last Year: Never true  Transportation Needs: No Transportation Needs (02/15/2023)   PRAPARE - Administrator, Civil Service (Medical): No    Lack of Transportation (Non-Medical): No  Physical Activity: Inactive (09/14/2022)   Exercise Vital Sign    Days of Exercise per Week: 0 days    Minutes of Exercise per Session: 0 min  Stress: No Stress Concern Present  (09/14/2022)   [employer] of Occupational Health - Occupational Stress Questionnaire    Feeling of Stress : Only a little  Social Connections: Socially Isolated (09/14/2022)   Social Connection and Isolation Panel [NHANES]    Frequency of Communication with Friends and Family: Twice a week    Frequency of Social Gatherings with Friends and Family: Never    Attends Religious Services: 1 to 4 times per year    Active Member of [employer] or Organizations: No    Attends Banker Meetings: Never    Marital Status: Divorced  Catering manager Violence: Not At Risk (02/15/2023)   Humiliation, Afraid, Rape, and Kick questionnaire    Fear of Current or Ex-Partner: No    Emotionally Abused: No    Physically Abused: No    Sexually Abused: No    Family History: Family History  Problem Relation Age of Onset   Heart attack Mother    Breast cancer Mother  Mastectomy   Leukemia Father    Cancer Brother    Cancer Brother    Cancer Brother    Cancer Brother    Cancer Brother    Colon cancer Neg Hx    Prostate cancer Neg Hx    Pancreatic cancer Neg Hx     Current Medications:  Current Outpatient Medications:    alfuzosin (UROXATRAL) 10 MG 24 hr tablet, Take 10 mg by mouth daily with breakfast., Disp: , Rfl:    cephALEXin (KEFLEX) 500 MG capsule, Take 500 mg by mouth 2 (two) times daily as needed (burning during urination)., Disp: , Rfl:    cyclobenzaprine (FLEXERIL) 5 MG tablet, Take 1 tablet (5 mg total) by mouth 3 (three) times daily as needed for muscle spasms., Disp: 30 tablet, Rfl: 0   darolutamide (NUBEQA) 300 MG tablet, Take 2 tablets (600 mg total) by mouth 2 (two) times daily with a meal., Disp: 120 tablet, Rfl: 3   DOCETAXEL IV, Inject into the vein every 21 ( twenty-one) days., Disp: , Rfl:    finasteride (PROSCAR) 5 MG tablet, Take 1 tablet (5 mg total) by mouth daily., Disp: 90 tablet, Rfl: 3   Iron, Ferrous Sulfate, 325 (65 Fe) MG TABS, Take 325 mg by mouth  every other day., Disp: 45 tablet, Rfl: 1   lidocaine-prilocaine (EMLA) cream, Apply a quarter sized amount to port a cath site and cover with plastic wrap one hour prior to infusion appointments, Disp: 30 g, Rfl: 3   megestrol (MEGACE) 20 MG tablet, TAKE ONE (1) TABLET (20 MG TOTAL) BY MOUTH THREE (3) (THREE) TIMES DAILY AS NEEDED., Disp: 90 tablet, Rfl: 11   Multiple Vitamin (MULTIVITAMIN) capsule, Take 1 capsule by mouth daily., Disp: , Rfl:    naproxen sodium (ALEVE) 220 MG tablet, Take 220 mg by mouth once., Disp: , Rfl:    oxybutynin (DITROPAN XL) 15 MG 24 hr tablet, TAKE ONE (1) TABLET (15 MG TOTAL) BY MOUTH DAILY., Disp: 90 tablet, Rfl: 3   oxyCODONE (ROXICODONE) 5 MG immediate release tablet, Take 2 tablets (10 mg total) by mouth every 6 (six) hours as needed for severe pain., Disp: 112 tablet, Rfl: 0   polyethylene glycol-electrolytes (NULYTELY) 420 g solution, Take by mouth., Disp: , Rfl:    prochlorperazine (COMPAZINE) 10 MG tablet, Take 1 tablet (10 mg total) by mouth every 6 (six) hours as needed for nausea or vomiting., Disp: 30 tablet, Rfl: 1   rosuvastatin (CRESTOR) 5 MG tablet, Take 1 tablet (5 mg total) by mouth daily., Disp: 90 tablet, Rfl: 1   sildenafil (VIAGRA) 100 MG tablet, Take 1 tablet (100 mg total) by mouth daily as needed for erectile dysfunction., Disp: 30 tablet, Rfl: 5   tamsulosin (FLOMAX) 0.4 MG CAPS capsule, Take 1 capsule (0.4 mg total) by mouth daily., Disp: 30 capsule, Rfl: 2   valsartan (DIOVAN) 160 MG tablet, Take 1 tablet (160 mg total) by mouth daily., Disp: 90 tablet, Rfl: 3   Allergies: Allergies  Allergen Reactions   Celebrex [Celecoxib]     Eyes drooping and messed up skin on hands    REVIEW OF SYSTEMS:   Review of Systems  Constitutional:  Positive for fatigue. Negative for chills and fever.  HENT:   Positive for trouble swallowing. Negative for lump/mass, mouth sores, nosebleeds and sore throat.   Eyes:  Negative for eye problems.   Respiratory:  Negative for cough and shortness of breath.   Cardiovascular:  Negative for chest pain, leg  swelling and palpitations.  Gastrointestinal:  Positive for constipation, nausea and vomiting. Negative for abdominal pain and diarrhea.  Genitourinary:  Positive for dysuria and hematuria. Negative for bladder incontinence, difficulty urinating, frequency and nocturia.   Musculoskeletal:  Negative for arthralgias, back pain, flank pain, myalgias and neck pain.       Left groin and testicular pain  Skin:  Negative for itching and rash.  Neurological:  Negative for dizziness, headaches and numbness.  Hematological:  Does not bruise/bleed easily.  Psychiatric/Behavioral:  Positive for sleep disturbance. Negative for depression and suicidal ideas. The patient is not nervous/anxious.   All other systems reviewed and are negative.    VITALS:   There were no vitals taken for this visit.  Wt Readings from Last 3 Encounters:  03/07/23 225 lb 6.4 oz (102.2 kg)  02/23/23 240 lb (108.9 kg)  02/19/23 230 lb (104.3 kg)    There is no height or weight on file to calculate BMI.  Performance status (ECOG): 2 - Symptomatic, <50% confined to bed  PHYSICAL EXAM:   Physical Exam Vitals reviewed.  Constitutional:      Comments: Very weak and lethargic  Cardiovascular:     Rate and Rhythm: Normal rate and regular rhythm.     Heart sounds: Normal heart sounds.  Pulmonary:     Effort: Pulmonary effort is normal.     Breath sounds: Normal breath sounds.  Abdominal:     Palpations: There is no hepatomegaly or splenomegaly.  Musculoskeletal:     Right lower leg: No edema.     Left lower leg: No edema.  Neurological:     General: No focal deficit present.     Mental Status: He is alert and oriented to person, place, and time.  Psychiatric:        Mood and Affect: Mood normal.        Behavior: Behavior normal.     LABS:      Latest Ref Rng & Units 03/15/2023    2:00 PM 03/07/2023     8:21 AM 02/23/2023   12:48 PM  CBC  WBC 4.0 - 10.5 K/uL 20.1  6.5  6.6   Hemoglobin 13.0 - 17.0 g/dL 16.1  09.6  04.5   Hematocrit 39.0 - 52.0 % 36.5  38.1  40.0   Platelets 150 - 400 K/uL 271  225  221       Latest Ref Rng & Units 03/15/2023    2:00 PM 03/07/2023    8:21 AM 02/15/2023    2:42 PM  CMP  Glucose 70 - 99 mg/dL 409  811  914   BUN 8 - 23 mg/dL 19  7  8    Creatinine 0.61 - 1.24 mg/dL 7.82  9.56  2.13   Sodium 135 - 145 mmol/L 131  137  137   Potassium 3.5 - 5.1 mmol/L 3.3  3.0  3.2   Chloride 98 - 111 mmol/L 95  105  106   CO2 22 - 32 mmol/L 21  23  22    Calcium 8.9 - 10.3 mg/dL 9.1  9.2  9.1   Total Protein 6.5 - 8.1 g/dL 8.0  7.4  7.9   Total Bilirubin 0.3 - 1.2 mg/dL 1.4  1.6  1.0   Alkaline Phos 38 - 126 U/L 140  96  89   AST 15 - 41 U/L 22  24  22    ALT 0 - 44 U/L 19  28  20  No results found for: "CEA1", "CEA" / No results found for: "CEA1", "CEA" Lab Results  Component Value Date   PSA1 1.0 05/04/2022   No results found for: "ZOX096" No results found for: "CAN125"  No results found for: "TOTALPROTELP", "ALBUMINELP", "A1GS", "A2GS", "BETS", "BETA2SER", "GAMS", "MSPIKE", "SPEI" Lab Results  Component Value Date   TIBC 436 08/17/2022   FERRITIN 35 08/17/2022   IRONPCTSAT 15 08/17/2022   No results found for: "LDH"   STUDIES:   IR Fluoro Guide Ndl Plmt / BX  Result Date: 02/23/2023 INDICATION: 72 year old male with T12 and L2 vertebral body lesions suspicious for metastatic disease. History of prostate cancer. EXAM: FLUOROSCOPY GUIDED T12 CORE BONE BIOPSY MEDICATIONS: Ancef 2 g IV; 10 mg of labetalol IV. ANESTHESIA/SEDATION: A total of Versed 1.5 mg and Fentanyl 125 mcg were administered intravenously for moderate conscious sedation monitored under my direct supervision. Total intraservice time of sedation was 33 minutes. The patient's vital signs were monitored throughout the procedure and recorded in the patient's medical record by the radiology  nurse. COMPLICATIONS: None immediate. Radiation: Peak skin dose 396 mGy. PROCEDURE: Informed written consent was obtained from the patient after a thorough discussion of the procedural risks, benefits and alternatives. All questions were addressed. Maximal Sterile Barrier Technique was utilized including caps, mask, sterile gowns, sterile gloves, sterile drape, hand hygiene and skin antiseptic. A timeout was performed prior to the initiation of the procedure. The patient was placed in prone position on the angiography table. The thoracic spine region was prepped and draped in a sterile fashion. Under fluoroscopy, the T12 vertebral body was delineated and the skin area was marked. The skin was infiltrated with a 1% Lidocaine approximately 3 cm lateral to the spinous process projection on the left. Using a 22-gauge spinal needle, the soft issue and the peripedicular space and periosteum were infiltrated with Bupivacaine 0.5%. A skin incision was made at the access site. Subsequently, an 11-gauge Kyphon trocar was inserted under fluoroscopic guidance until contact with the pedicle was obtained. The trocar was inserted under light hammer tapping into the pedicle anterior its midportion was reached. The diamond mandrill was removed and the biopsy needle was advanced through the pedicle into the vertebral body under fluoroscopic guidance. One large core biopsy was obtained. Brisk blood return was seen from the trocar. Gel-Foam pledgets were advanced through the trocar with complete resolution of the bleeding. The trocar was later removed. The access site was cleaned and covered with a sterile bandage. IMPRESSION: Successful fluoroscopy guided core biopsy of the T12 vertebral body. Biopsy sample was sent for tissue exam. Electronically Signed   By: Baldemar Lenis M.D.   On: 02/23/2023 17:52   MR Thoracic Spine W Wo Contrast  Result Date: 02/19/2023 CLINICAL DATA:  Metastatic prostate cancer, low back pain  EXAM: MRI THORACIC WITHOUT AND WITH CONTRAST TECHNIQUE: Multiplanar and multiecho pulse sequences of the thoracic spine were obtained without and with intravenous contrast. CONTRAST:  10mL GADAVIST GADOBUTROL 1 MMOL/ML IV SOLN COMPARISON:  None Available. FINDINGS: Alignment:  Physiologic. Vertebrae: No acute fracture. No discitis or osteomyelitis. Abnormal marrow lesion throughout the T12 vertebral body extending into the left pedicle with an epidural soft tissue component extending along the roof of the T12-L1 neural foramen and along the medial aspect mildly flattening the left lateral thecal sac. Mild narrowing of the left T12-L1 neural foramen. Abnormal bone lesion in the left posterior ninth rib consistent with metastatic disease. Cord:  Normal signal and morphology. Paraspinal  and other soft tissues: No acute paraspinal abnormality. Disc levels: Disc spaces: Thoracic disc spaces are maintained. Degenerative disease with disc height loss at C5-6 and C6-7. T1-T2: No disc protrusion, foraminal stenosis or central canal stenosis. T2-T3: No disc protrusion, foraminal stenosis or central canal stenosis. T3-T4: No disc protrusion, foraminal stenosis or central canal stenosis. T4-T5: No disc protrusion, foraminal stenosis or central canal stenosis. T5-T6: No disc protrusion, foraminal stenosis or central canal stenosis. T6-T7: No disc protrusion, foraminal stenosis or central canal stenosis. T7-T8: No disc protrusion, foraminal stenosis or central canal stenosis. T8-T9: No disc protrusion, foraminal stenosis or central canal stenosis. T9-T10: No disc protrusion, foraminal stenosis or central canal stenosis. T10-T11: No disc protrusion, foraminal stenosis or central canal stenosis. T11-T12: No disc protrusion, foraminal stenosis or central canal stenosis. IMPRESSION: 1. Abnormal marrow lesion throughout the T12 vertebral body extending into the left pedicle with an epidural soft tissue component extending along the  roof of the T12-L1 neural foramen and along the medial aspect mildly flattening the left lateral thecal sac. Mild narrowing of the left T12-L1 neural foramen. Findings are consistent with metastatic disease. 2. Abnormal bone lesion in the left posterior ninth rib consistent with metastatic disease. Electronically Signed   By: Elige Ko M.D.   On: 02/19/2023 14:58   MR Lumbar Spine W Wo Contrast  Result Date: 02/19/2023 CLINICAL DATA:  Metastatic prostate cancer. EXAM: MRI LUMBAR SPINE WITHOUT AND WITH CONTRAST TECHNIQUE: Multiplanar and multiecho pulse sequences of the lumbar spine were obtained without and with intravenous contrast. CONTRAST:  10mL GADAVIST GADOBUTROL 1 MMOL/ML IV SOLN COMPARISON:  None Available. FINDINGS: Segmentation:  Standard. Alignment:  Physiologic. Vertebrae: No acute fracture or evidence of discitis-osteomyelitis. Abnormal marrow lesion throughout the T12 vertebral body consistent with metastatic disease extending into the left pedicle with a small epidural tumor component extending into the roof of the left T12-L1 neural foramen with foraminal stenosis. Abnormal marrow signal throughout the L2 vertebral body with extension into the left L2 pedicle consistent with metastatic disease. Conus medullaris and cauda equina: Conus extends to the T12 level. Conus and cauda equina appear normal. Paraspinal and other soft tissues: No acute paraspinal abnormality. Disc levels: Disc spaces: Degenerative disease with disc height loss L5-S1. Disc desiccation throughout the lumbar spine. T12-L1: No disc protrusion. No spinal stenosis. Mild bilateral facet arthropathy. No right foraminal stenosis. L1-L2: No significant disc bulge. Mild bilateral facet arthropathy. No foraminal or central canal stenosis. L2-L3: Mild broad-based disc bulge. Moderate bilateral facet arthropathy. No foraminal or central canal stenosis. L3-L4: Mild broad-based disc bulge. Mild bilateral facet arthropathy. No foraminal or  central canal stenosis. L4-L5: Broad-based disc bulge. Right laminectomy. Mild bilateral facet arthropathy. No spinal stenosis. Mild bilateral foraminal stenosis. L5-S1: Mild broad-based disc bulge. Right laminectomy. Mild bilateral facet arthropathy. Moderate-severe bilateral foraminal stenosis. No spinal stenosis. IMPRESSION: 1. Abnormal marrow lesion throughout the T12 vertebral body consistent with metastatic disease extending into the left pedicle with small epidural tumor component extending into the roof of the left T12-L1 neural foramen with foraminal stenosis. 2. Abnormal marrow signal throughout the L2 vertebral body with extension into the left L2 pedicle consistent with metastatic disease. 3. Lumbar spine spondylosis as described above. Electronically Signed   By: Elige Ko M.D.   On: 02/19/2023 14:51   IR IMAGING GUIDED PORT INSERTION  Result Date: 02/19/2023 CLINICAL DATA:  Metastatic prostate carcinoma and need for porta cath for chemotherapy. EXAM: IMPLANTED PORT A CATH PLACEMENT WITH ULTRASOUND AND FLUOROSCOPIC GUIDANCE  ANESTHESIA/SEDATION: Moderate (conscious) sedation was employed during this procedure. A total of Versed 2.0 mg and Fentanyl 100 mcg was administered intravenously by radiology nurse. Moderate Sedation Time: 22 minutes. The patient's level of consciousness and vital signs were monitored continuously by radiology nursing throughout the procedure under my direct supervision. FLUOROSCOPY: 30 seconds.  2.0 mGy. PROCEDURE: The procedure, risks, benefits, and alternatives were explained to the patient. Questions regarding the procedure were encouraged and answered. The patient understands and consents to the procedure. A time-out was performed prior to initiating the procedure. Ultrasound was utilized to confirm patency of the right internal jugular vein. Under ultrasound image was saved and recorded. The right neck and chest were prepped with chlorhexidine in a sterile fashion, and  a sterile drape was applied covering the operative field. Maximum barrier sterile technique with sterile gowns and gloves were used for the procedure. Local anesthesia was provided with 1% lidocaine. After creating a small venotomy incision, a 21 gauge needle was advanced into the right internal jugular vein under direct, real-time ultrasound guidance. Ultrasound image documentation was performed. After securing guidewire access, an 8 Fr dilator was placed. A J-wire was kinked to measure appropriate catheter length. A subcutaneous port pocket was then created along the upper chest wall utilizing sharp and blunt dissection. Portable cautery was utilized. The pocket was irrigated with sterile saline. A single lumen power injectable port was chosen for placement. The 8 Fr catheter was tunneled from the port pocket site to the venotomy incision. The port was placed in the pocket. External catheter was trimmed to appropriate length based on guidewire measurement. At the venotomy, an 8 Fr peel-away sheath was placed over a guidewire. The catheter was then placed through the sheath and the sheath removed. Final catheter positioning was confirmed and documented with a fluoroscopic spot image. The port was accessed with a needle and aspirated and flushed with heparinized saline. The access needle was removed. The venotomy and port pocket incisions were closed with subcutaneous 3-0 Monocryl and subcuticular 4-0 Vicryl. Dermabond was applied to both incisions. COMPLICATIONS: COMPLICATIONS None FINDINGS: After catheter placement, the tip lies at the cavo-atrial junction. The catheter aspirates normally and is ready for immediate use. IMPRESSION: Placement of single lumen port a cath via right internal jugular vein. The catheter tip lies at the cavo-atrial junction. A power injectable port a cath was placed and is ready for immediate use. Electronically Signed   By: Irish Lack M.D.   On: 02/19/2023 14:48   Ultrasound  renal complete  Result Date: 02/18/2023 CLINICAL DATA:  Nephrolithiasis EXAM: RENAL / URINARY TRACT ULTRASOUND COMPLETE COMPARISON:  None Available. FINDINGS: Right Kidney: Length: 10.8 cm. Echogenicity within normal limits. No mass or hydronephrosis visualized. Left Kidney: Length: 9.7 cm. Echogenicity within normal limits. No mass or hydronephrosis visualized. Bladder: Appears normal for degree of bladder distention. IMPRESSION: Unremarkable examination. Electronically Signed   By: Layla Maw M.D.   On: 02/18/2023 16:44      ASSESSMENT & PLAN:   Assessment:  1.  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  Quaranta in [place] restarted on 12/04/2022, second dose 01/12/2023 - PSMA PET (11/30/2022): Radiotracer avid lesions involving left posteromedial ninth rib, T12 and L2 vertebral bodies.  Retroperitoneal and iliac side chain lymph nodes.  2.  Social/family history: -He lives at home with his son [name].  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 - Father had cancer, type not known to the patient.  Mother had breast cancer and died in her 63s.  1 brother had prostate cancer and another brother had pancreatic cancer.  1 other brother had cancer, type unknown to the patient.  Plan:  1.  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   --Labs from today were reviewed WBC 20.1 (due to GCSF injection), Hgb 12.5, Plt 271. Creatinine has increased to 1.64. LFTs normal.  --Recommend 1 L of NS IV fluids today with IV zofran.  --Need to come back early next week with labs and fluids  --Due to return on 03/28/2023 for cycle 2 of docetaxel. Continue on darolutamide 600 mg BID.   2. Nausea/Vomiting: --Currently on compazine every 6 hours as needed with minimal relief --Recommend to alternative zofran 8 mg ODT and compazine 10 mg every 4 hours.  3. Constipation: --since patient's last BM was 2 weeks ago, recommend magnesium citrate (starting with 1/2 bottle). --once patient has a bowel movement, start on a bowel regimen with 1-2 tablets of senna-s daily  4. Elevated creatinine levels: --Likely secondary to nausea/vomiting and poor PO intake of fluids --Will give IV fluids today. Encouraged to drink 1-2 L of water per day.   5. Dysuria and hematuria: --UA and culture today to rule out UTI  6. Weight loss: --Lost 6 lbs since starting chemotherapy --Sent urgent referral to [facility] nutrition team. --Hopefully will improve once nausea/vomiting and constipation has improved.  --Recommend to supplement with protein shakes.   7. Left back pain radiating to the left groin: - 02/01/2023: Left 6 x 26 JJ stent placement after left ureteroscopic stone manipulation with basket extraction. - We will send oxycodone 5 mg 2 tablets every 6 hours as needed #112. - MRI of the thoracic and lumbar spine from 02/16/2023 showed L2 and T12 vertebral body metastases. --Underwent T12 vertebral body biopsy from 02/23/2023 which confirmed metstatic prostatic adenocarcinoma. We will order NGS testing  - Plan to undergo palliative radiation to T12 and L2 metastases starting next week.  8.  Bone health: - Need to discuss adding denosumab to decrease SRE. Can further discuss at next visit.   Patient and his son expressed understanding of the plan provided.    I have spent a total of 30 minutes minutes of face-to-face and non-face-to-face time, preparing to see the patient, performing a medically appropriate examination, counseling and educating the patient, ordering medications/tests/procedures,documenting clinical information in the electronic health record, and care coordination.   Georga Kaufmann PA-C Dept of Hematology and Oncology [facility]

## 2023-03-15 NOTE — Progress Notes (Signed)
Port flushed with good blood return noted. No bruising or swelling at site. Bandaid applied and patient discharged in satisfactory condition. VVS stable with no signs or symptoms of distressed noted. 

## 2023-03-15 NOTE — Progress Notes (Signed)
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  Questions answered regarding the [employer].  CSW also encouraged pt's son to reach out to the [employer] in [place] as they may also be willing to assist if pt's electricity is in jeopardy of being disconnected.  CSW informed pt that the [employer] will not be able to assist if the electricity is disconnected already as they are not able to pay the reconnection fee on top of what is owed in arrears.  CSW to remain available as appropriate to provide assistance throughout duration of treatment.      Rachel Moulds, LCSW Clinical Social Worker [facility]

## 2023-03-20 ENCOUNTER — Other Ambulatory Visit (HOSPITAL_COMMUNITY): Payer: Self-pay

## 2023-03-20 ENCOUNTER — Inpatient Hospital Stay: Payer: Medicare Other

## 2023-03-22 ENCOUNTER — Other Ambulatory Visit: Payer: Self-pay

## 2023-03-23 ENCOUNTER — Telehealth (INDEPENDENT_AMBULATORY_CARE_PROVIDER_SITE_OTHER): Payer: Self-pay | Admitting: Gastroenterology

## 2023-03-23 ENCOUNTER — Encounter (HOSPITAL_COMMUNITY): Admission: RE | Admit: 2023-03-23 | Payer: Medicare Other | Source: Ambulatory Visit

## 2023-03-23 NOTE — Telephone Encounter (Signed)
Lillia Mountain, RN  Marlowe Shores, LPN; Dolores Frame, MD; Laurence Ferrari, Avie Arenas, RN Halina Andreas,  Mr Azizi needs to reschedule the EGD/TCS for 03/27/2023 per son   Thanks,  Cliffton Asters RN   Spoke with son [name] and they are wanting to reschedule due to having someone come out to the house on Tuesday for long term care. Son states they have so many appointments right now. Asked pt son if it would be better if we rescheduled at a later time. Pt son state that may be better. Will keep in touch with son to see if they are ready to schedule.

## 2023-03-26 ENCOUNTER — Ambulatory Visit: Payer: Medicare Other | Admitting: Urology

## 2023-03-27 ENCOUNTER — Encounter (HOSPITAL_COMMUNITY): Admission: RE | Payer: Self-pay | Source: Home / Self Care

## 2023-03-27 ENCOUNTER — Ambulatory Visit (HOSPITAL_COMMUNITY): Admission: RE | Admit: 2023-03-27 | Payer: Medicare Other | Source: Home / Self Care | Admitting: Gastroenterology

## 2023-03-27 SURGERY — COLONOSCOPY WITH PROPOFOL
Anesthesia: Monitor Anesthesia Care

## 2023-03-27 NOTE — Progress Notes (Signed)
[facility] [address] [place] [address]    Clinic Day:  03/28/2023  Referring physician: Tommie Sams, DO  Patient Care Team: Tommie Sams, DO as PCP - General (Family Medicine) Doreatha Massed, MD as Medical Oncologist (Medical Oncology) Therese Sarah, RN as Oncology Nurse Navigator (Medical Oncology)   ASSESSMENT & PLAN:   Assessment: 1.  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 Jeannette How in [place] restarted on 12/04/2022, second dose 01/12/2023 - PSMA PET (11/30/2022): Radiotracer avid lesions involving left posteromedial ninth rib, T12 and L2 vertebral bodies.  Retroperitoneal and iliac side chain lymph nodes. - NGS: TMB-low, MS-stable.  BCOR and KMT2C pathogenic variants.  No other targetable mutations. - Docetaxel and darolutamide started on 03/07/2023   2.  Social/family history: -He lives at home with his son [name].  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 - Father had cancer, type not known to the patient.  Mother had breast cancer and died in her 58s.  1 brother had prostate cancer and another brother had pancreatic cancer.  1 other brother had cancer, type unknown to the patient.    Plan: 1.  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 - Patient is not taking twice daily consistently as he is only eating 1 meal per day.  I have encouraged him to take it with a snack twice  daily. - After last treatment he had nausea and vomiting for 1 to 2 weeks which has improved in the last week. - Last Firmagon on 02/20/2023. - Labs today: Creatinine 1.36, LFTs normal.  Albumin low at 3.3.  CBC with white count 3.3 and ANC of 2.7.  Platelet count is adequate. - Due to poor tolerance, will decrease docetaxel dose to 60 mg/m.  We will try to arrange on pro device. - We will arrange him for IV fluids weekly.  RTC 3 weeks for follow-up.   2. Nausea/Vomiting: -- Continue Compazine/Zofran.  He was also given promethazine at [facility].   3. Constipation: -He had a bowel movement after using mag citrate. - Recommend that he start stool softener daily and MiraLAX. - If it does not work, will consider lactulose.   4. Elevated creatinine levels: -- Likely from poor p.o. intake.  He will receive 1 L of fluid with electrolytes today.   5. Weight loss: -- He lost 6 pounds since first cycle. - Reports decreased appetite. - Will start him on Megace 400 mg twice daily.   7. Left back pain radiating to the left groin: - 02/01/2023: Left 6 x 26 JJ stent placement after left ureteroscopic stone manipulation with basket extraction. - Status post XRT to the L2 and T12 body completed on 03/26/2023. - Reports left lower back pain radiating to the left testicle. - Continue oxycodone 5 mg 2 tablets every 5 hours as needed.   8.  Bone health: - I will discuss adding denosumab to decrease SRE at next visit.    Orders Placed This Encounter  Procedures   Magnesium    Standing Status:   Future    Standing Expiration Date:   04/17/2024   PSA    Standing Status:   Future    Standing Expiration Date:   04/17/2024   CBC with Differential    Standing Status:   Future    Standing Expiration Date:   04/17/2024   Comprehensive metabolic panel    Standing Status:   Future    Standing Expiration Date:   04/17/2024   Magnesium    Standing Status:   Future    Standing Expiration Date:   05/08/2024    PSA    Standing Status:   Future    Standing Expiration Date:   05/08/2024   CBC with Differential    Standing Status:   Future    Standing Expiration Date:   05/08/2024   Comprehensive metabolic panel    Standing Status:   Future    Standing Expiration Date:   05/08/2024   Magnesium    Standing Status:   Future    Standing Expiration Date:   05/29/2024   PSA    Standing Status:   Future    Standing Expiration Date:   05/29/2024   CBC with Differential    Standing Status:   Future    Standing Expiration Date:   05/29/2024   Comprehensive metabolic panel    Standing Status:   Future    Standing Expiration Date:   05/29/2024   Magnesium    Standing Status:   Future    Standing Expiration Date:   06/19/2024   PSA    Standing Status:   Future    Standing Expiration Date:   06/19/2024   CBC with Differential    Standing Status:   Future    Standing Expiration Date:   06/19/2024   Comprehensive metabolic panel    Standing Status:   Future    Standing Expiration Date:   06/19/2024      I,Katie Daubenspeck,acting as a scribe for Doreatha Massed, MD.,have documented all relevant documentation on the behalf of Doreatha Massed, MD,as directed by  Doreatha Massed, MD while in the presence of Doreatha Massed, MD.   I, Doreatha Massed MD, have reviewed the above documentation for accuracy and completeness, and I agree with the above.   Doreatha Massed, MD   5/15/20245:30 PM  CHIEF COMPLAINT:   Diagnosis: metastatic prostate cancer    Cancer Staging  Prostate cancer [facility]) Staging form: Prostate, AJCC 8th Edition - Clinical stage from 08/25/2020: Stage IVB (cT1c, cN1, cM1c, PSA: 5.4, Grade Group: 5) - Unsigned    Prior Therapy: XRT to prostate/pelvic nodes 11/16/20 - 01/14/21   Current Therapy:  ADT, Docetaxel q 21 days    HISTORY OF PRESENT ILLNESS:   Oncology History  Prostate cancer (HCC)  08/30/2020 Initial Diagnosis   Prostate cancer   03/07/2023 -   Chemotherapy   Patient is on Treatment Plan : PROSTATE Docetaxel (75) q21d      Genetic Testing   No pathogenic variants identified on the [facility] Multi-Cancer+RNA panel. VUS in HOXB13 called c.314C>T and VUS in PMS2 called c.682G>A identified. The report date is 02/24/2023.  The Multi-Cancer + RNA Panel offered by [facility] includes sequencing and/or deletion/duplication analysis of the following 70 genes:  AIP*, ALK, APC*, ATM*, AXIN2*, BAP1*, BARD1*, BLM*, BMPR1A*, BRCA1*, BRCA2*, BRIP1*, CDC73*, CDH1*, CDK4, CDKN1B*, CDKN2A, CHEK2*, CTNNA1*, DICER1*, EPCAM, EGFR, FH*, FLCN*, GREM1, HOXB13, KIT, LZTR1, MAX*, MBD4, MEN1*, MET, MITF,  MLH1*, MSH2*, MSH3*, MSH6*, MUTYH*, NF1*, NF2*, NTHL1*, PALB2*, PDGFRA, PMS2*, POLD1*, POLE*, POT1*, PRKAR1A*, PTCH1*, PTEN*, RAD51C*, RAD51D*, RB1*, RET, SDHA*, SDHAF2*, SDHB*, SDHC*, SDHD*, SMAD4*, SMARCA4*, SMARCB1*, SMARCE1*, STK11*, SUFU*, TMEM127*, TP53*, TSC1*, TSC2*, VHL*. RNA analysis is performed for * genes.      INTERVAL HISTORY:   Markley is a 72 y.o. male presenting to clinic today for follow up of metastatic prostate cancer. He was last seen by PA Karena Addison on 03/15/23.  Today, he states that he is doing well overall. His appetite level is at 25%. His energy level is at an %.  PAST MEDICAL HISTORY:   Past Medical History: Past Medical History:  Diagnosis Date   Abnormal stress test    Borderline hypertension    Chest discomfort    Atypical   DJD (degenerative joint disease)    Knees, lumbar sacral spine   Hyperlipidemia    Hypertension    Port-A-Cath in place 02/22/2023   Prostate cancer [facility])     Surgical History: Past Surgical History:  Procedure Laterality Date   BACK SURGERY     CYSTOSCOPY WITH FULGERATION  04/18/2021   Procedure: CYSTOSCOPY WITH FULGERATION of BLADDER NECK;  Surgeon: Malen Gauze, MD;  Location: [facility];  Service: Urology;;   CYSTOSCOPY WITH INJECTION N/A 04/18/2021   Procedure: CYSTOSCOPY WITH INJECTION- Botox 100  units;  Surgeon: Malen Gauze, MD;  Location: [facility];  Service: Urology;  Laterality: N/A;   CYSTOSCOPY WITH RETROGRADE PYELOGRAM, URETEROSCOPY AND STENT PLACEMENT Left 02/01/2023   Procedure: CYSTOSCOPY WITH RETROGRADE PYELOGRAM, URETEROSCOPY AND STENT PLACEMENT;  Surgeon: Malen Gauze, MD;  Location: [facility];  Service: Urology;  Laterality: Left;   IR FLUORO GUIDED NEEDLE PLC ASPIRATION/INJECTION LOC  02/23/2023   IR IMAGING GUIDED PORT INSERTION  02/19/2023   STONE EXTRACTION WITH BASKET Left 02/01/2023   Procedure: STONE EXTRACTION WITH BASKET;  Surgeon: Malen Gauze, MD;  Location: [facility];  Service: Urology;  Laterality: Left;   TRANSURETHRAL RESECTION OF BLADDER TUMOR N/A 08/25/2020   Procedure: TRANSURETHRAL RESECTION OF BLADDER TUMOR (TURBT);  Surgeon: Malen Gauze, MD;  Location: [facility];  Service: Urology;  Laterality: N/A;    Social History: Social History   Socioeconomic History   Marital status: Divorced    Spouse name: Not on file   Number of children: 2   Years of education: Not on file   Highest education level: Not on file  Occupational History   Occupation: Long distance Scientist, water quality: [employer]  Tobacco Use   Smoking status: Former    Packs/day: 1.00    Years: 20.00    Additional pack years: 0.00    Total pack years: 20.00    Types: Cigarettes    Quit date: 11/13/1998    Years since quitting: 24.3    Passive exposure: Current   Smokeless tobacco: Never  Vaping Use   Vaping Use: Never used  Substance and Sexual Activity   Alcohol use: No   Drug use: Never   Sexual activity: Not Currently  Other Topics Concern   Not on file  Social History Narrative   Divorced   Social Determinants of Health   Financial Resource Strain: Low Risk  (09/14/2022)   Overall Financial Resource Strain (CARDIA)    Difficulty of Paying Living Expenses: Not hard at all  Food Insecurity: No Food Insecurity (02/15/2023)   Hunger Vital Sign     Worried About Running Out of Food in the Last  Year: Never true    Ran Out of Food in the Last Year: Never true  Transportation Needs: No Transportation Needs (02/15/2023)   PRAPARE - Administrator, Civil Service (Medical): No    Lack of Transportation (Non-Medical): No  Physical Activity: Inactive (09/14/2022)   Exercise Vital Sign    Days of Exercise per Week: 0 days    Minutes of Exercise per Session: 0 min  Stress: No Stress Concern Present (09/14/2022)   [employer] of Occupational Health - Occupational Stress Questionnaire    Feeling of Stress : Only a little  Social Connections: Socially Isolated (09/14/2022)   Social Connection and Isolation Panel [NHANES]    Frequency of Communication with Friends and Family: Twice a week    Frequency of Social Gatherings with Friends and Family: Never    Attends Religious Services: 1 to 4 times per year    Active Member of [employer] or Organizations: No    Attends Banker Meetings: Never    Marital Status: Divorced  Catering manager Violence: Not At Risk (02/15/2023)   Humiliation, Afraid, Rape, and Kick questionnaire    Fear of Current or Ex-Partner: No    Emotionally Abused: No    Physically Abused: No    Sexually Abused: No    Family History: Family History  Problem Relation Age of Onset   Heart attack Mother    Breast cancer Mother        Mastectomy   Leukemia Father    Cancer Brother    Cancer Brother    Cancer Brother    Cancer Brother    Cancer Brother    Colon cancer Neg Hx    Prostate cancer Neg Hx    Pancreatic cancer Neg Hx     Current Medications:  Current Outpatient Medications:    megestrol (MEGACE) 400 MG/10ML suspension, Take 10 mLs (400 mg total) by mouth 2 (two) times daily., Disp: 480 mL, Rfl: 4   alfuzosin (UROXATRAL) 10 MG 24 hr tablet, Take 10 mg by mouth daily with breakfast., Disp: , Rfl:    cephALEXin (KEFLEX) 500 MG capsule, Take 500 mg by mouth 2 (two) times daily as needed  (burning during urination)., Disp: , Rfl:    cyclobenzaprine (FLEXERIL) 5 MG tablet, Take 1 tablet (5 mg total) by mouth 3 (three) times daily as needed for muscle spasms., Disp: 30 tablet, Rfl: 0   darolutamide (NUBEQA) 300 MG tablet, Take 2 tablets (600 mg total) by mouth 2 (two) times daily with a meal., Disp: 120 tablet, Rfl: 3   DOCETAXEL IV, Inject into the vein every 21 ( twenty-one) days., Disp: , Rfl:    finasteride (PROSCAR) 5 MG tablet, Take 1 tablet (5 mg total) by mouth daily., Disp: 90 tablet, Rfl: 3   Iron, Ferrous Sulfate, 325 (65 Fe) MG TABS, Take 325 mg by mouth every other day., Disp: 45 tablet, Rfl: 1   lidocaine-prilocaine (EMLA) cream, Apply a quarter sized amount to port a cath site and cover with plastic wrap one hour prior to infusion appointments, Disp: 30 g, Rfl: 3   Multiple Vitamin (MULTIVITAMIN) capsule, Take 1 capsule by mouth daily., Disp: , Rfl:    naproxen sodium (ALEVE) 220 MG tablet, Take 220 mg by mouth once., Disp: , Rfl:    ondansetron (ZOFRAN-ODT) 8 MG disintegrating tablet, Take 1 tablet (8 mg total) by mouth every 8 (eight) hours as needed for nausea or vomiting., Disp: 90 tablet, Rfl: 0  oxybutynin (DITROPAN XL) 15 MG 24 hr tablet, TAKE ONE (1) TABLET (15 MG TOTAL) BY MOUTH DAILY., Disp: 90 tablet, Rfl: 3   oxyCODONE (ROXICODONE) 5 MG immediate release tablet, Take 2 tablets (10 mg total) by mouth every 6 (six) hours as needed for severe pain., Disp: 180 tablet, Rfl: 0   polyethylene glycol-electrolytes (NULYTELY) 420 g solution, Take by mouth., Disp: , Rfl:    prochlorperazine (COMPAZINE) 10 MG tablet, Take 1 tablet (10 mg total) by mouth every 6 (six) hours as needed for nausea or vomiting., Disp: 30 tablet, Rfl: 1   rosuvastatin (CRESTOR) 5 MG tablet, Take 1 tablet (5 mg total) by mouth daily., Disp: 90 tablet, Rfl: 1   sildenafil (VIAGRA) 100 MG tablet, Take 1 tablet (100 mg total) by mouth daily as needed for erectile dysfunction., Disp: 30 tablet, Rfl:  5   tamsulosin (FLOMAX) 0.4 MG CAPS capsule, Take 1 capsule (0.4 mg total) by mouth daily., Disp: 30 capsule, Rfl: 2   valsartan (DIOVAN) 160 MG tablet, Take 1 tablet (160 mg total) by mouth daily., Disp: 90 tablet, Rfl: 3 No current facility-administered medications for this visit.  Facility-Administered Medications Ordered in Other Visits:    sodium chloride flush (NS) 0.9 % injection 10 mL, 10 mL, Intracatheter, PRN, Doreatha Massed, MD, 10 mL at 03/28/23 1456   Allergies: Allergies  Allergen Reactions   Celebrex [Celecoxib]     Eyes drooping and messed up skin on hands    REVIEW OF SYSTEMS:   Review of Systems  Constitutional:  Negative for chills, fatigue and fever.  HENT:   Negative for lump/mass, mouth sores, nosebleeds, sore throat and trouble swallowing.   Eyes:  Negative for eye problems.  Respiratory:  Negative for cough and shortness of breath.   Cardiovascular:  Negative for chest pain, leg swelling and palpitations.  Gastrointestinal:  Positive for constipation and nausea. Negative for abdominal pain, diarrhea and vomiting.  Genitourinary:  Negative for bladder incontinence, difficulty urinating, dysuria, frequency, hematuria and nocturia.   Musculoskeletal:  Positive for back pain. Negative for arthralgias, flank pain, myalgias and neck pain.  Skin:  Negative for itching and rash.  Neurological:  Negative for dizziness, headaches and numbness.  Hematological:  Does not bruise/bleed easily.  Psychiatric/Behavioral:  Positive for sleep disturbance. Negative for depression and suicidal ideas. The patient is not nervous/anxious.   All other systems reviewed and are negative.    VITALS:   Blood pressure 122/84, pulse 60, temperature 97.6 F (36.4 C), temperature source Oral, resp. rate 18, weight 209 lb 7 oz (95 kg), SpO2 100 %.  Wt Readings from Last 3 Encounters:  03/28/23 209 lb 7 oz (95 kg)  03/07/23 225 lb 6.4 oz (102.2 kg)  02/23/23 240 lb (108.9 kg)     Body mass index is 30.05 kg/m.  Performance status (ECOG): 1 - Symptomatic but completely ambulatory  PHYSICAL EXAM:   Physical Exam Vitals and nursing note reviewed. Exam conducted with a chaperone present.  Constitutional:      Appearance: Normal appearance.  Cardiovascular:     Rate and Rhythm: Normal rate and regular rhythm.     Pulses: Normal pulses.     Heart sounds: Normal heart sounds.  Pulmonary:     Effort: Pulmonary effort is normal.     Breath sounds: Normal breath sounds.  Abdominal:     Palpations: Abdomen is soft. There is no hepatomegaly, splenomegaly or mass.     Tenderness: There is no abdominal tenderness.  Musculoskeletal:     Right lower leg: No edema.     Left lower leg: No edema.  Lymphadenopathy:     Cervical: No cervical adenopathy.     Right cervical: No superficial, deep or posterior cervical adenopathy.    Left cervical: No superficial, deep or posterior cervical adenopathy.     Upper Body:     Right upper body: No supraclavicular or axillary adenopathy.     Left upper body: No supraclavicular or axillary adenopathy.  Neurological:     General: No focal deficit present.     Mental Status: He is alert and oriented to person, place, and time.  Psychiatric:        Mood and Affect: Mood normal.        Behavior: Behavior normal.     LABS:      Latest Ref Rng & Units 03/28/2023   10:42 AM 03/15/2023    2:00 PM 03/07/2023    8:21 AM  CBC  WBC 4.0 - 10.5 K/uL 3.3  20.1  6.5   Hemoglobin 13.0 - 17.0 g/dL 81.1  91.4  78.2   Hematocrit 39.0 - 52.0 % 33.9  36.5  38.1   Platelets 150 - 400 K/uL 346  271  225       Latest Ref Rng & Units 03/28/2023   10:42 AM 03/15/2023    2:00 PM 03/07/2023    8:21 AM  CMP  Glucose 70 - 99 mg/dL 956  213  086   BUN 8 - 23 mg/dL 11  19  7    Creatinine 0.61 - 1.24 mg/dL 5.78  4.69  6.29   Sodium 135 - 145 mmol/L 135  131  137   Potassium 3.5 - 5.1 mmol/L 3.2  3.3  3.0   Chloride 98 - 111 mmol/L 102  95  105    CO2 22 - 32 mmol/L 22  21  23    Calcium 8.9 - 10.3 mg/dL 8.9  9.1  9.2   Total Protein 6.5 - 8.1 g/dL 7.1  8.0  7.4   Total Bilirubin 0.3 - 1.2 mg/dL 1.5  1.4  1.6   Alkaline Phos 38 - 126 U/L 124  140  96   AST 15 - 41 U/L 18  22  24    ALT 0 - 44 U/L 20  19  28       No results found for: "CEA1", "CEA" / No results found for: "CEA1", "CEA" Lab Results  Component Value Date   PSA1 1.0 05/04/2022   No results found for: "BMW413" No results found for: "CAN125"  No results found for: "TOTALPROTELP", "ALBUMINELP", "A1GS", "A2GS", "BETS", "BETA2SER", "GAMS", "MSPIKE", "SPEI" Lab Results  Component Value Date   TIBC 436 08/17/2022   FERRITIN 35 08/17/2022   IRONPCTSAT 15 08/17/2022   No results found for: "LDH"   STUDIES:   No results found.

## 2023-03-28 ENCOUNTER — Inpatient Hospital Stay: Payer: Medicare Other

## 2023-03-28 ENCOUNTER — Inpatient Hospital Stay (HOSPITAL_BASED_OUTPATIENT_CLINIC_OR_DEPARTMENT_OTHER): Payer: Medicare Other | Admitting: Hematology

## 2023-03-28 ENCOUNTER — Other Ambulatory Visit: Payer: Self-pay | Admitting: *Deleted

## 2023-03-28 VITALS — BP 144/75 | HR 80 | Temp 96.9°F | Resp 20

## 2023-03-28 VITALS — BP 122/84 | HR 60 | Temp 97.6°F | Resp 18 | Wt 209.4 lb

## 2023-03-28 DIAGNOSIS — C61 Malignant neoplasm of prostate: Secondary | ICD-10-CM

## 2023-03-28 DIAGNOSIS — Z95828 Presence of other vascular implants and grafts: Secondary | ICD-10-CM

## 2023-03-28 DIAGNOSIS — Z5111 Encounter for antineoplastic chemotherapy: Secondary | ICD-10-CM | POA: Diagnosis not present

## 2023-03-28 DIAGNOSIS — E860 Dehydration: Secondary | ICD-10-CM

## 2023-03-28 LAB — CBC WITH DIFFERENTIAL/PLATELET
Abs Immature Granulocytes: 0.03 10*3/uL (ref 0.00–0.07)
Basophils Absolute: 0 10*3/uL (ref 0.0–0.1)
Basophils Relative: 1 %
Eosinophils Absolute: 0.1 10*3/uL (ref 0.0–0.5)
Eosinophils Relative: 2 %
HCT: 33.9 % — ABNORMAL LOW (ref 39.0–52.0)
Hemoglobin: 10.9 g/dL — ABNORMAL LOW (ref 13.0–17.0)
Immature Granulocytes: 1 %
Lymphocytes Relative: 5 %
Lymphs Abs: 0.2 10*3/uL — ABNORMAL LOW (ref 0.7–4.0)
MCH: 26.7 pg (ref 26.0–34.0)
MCHC: 32.2 g/dL (ref 30.0–36.0)
MCV: 82.9 fL (ref 80.0–100.0)
Monocytes Absolute: 0.3 10*3/uL (ref 0.1–1.0)
Monocytes Relative: 9 %
Neutro Abs: 2.7 10*3/uL (ref 1.7–7.7)
Neutrophils Relative %: 82 %
Platelets: 346 10*3/uL (ref 150–400)
RBC: 4.09 MIL/uL — ABNORMAL LOW (ref 4.22–5.81)
RDW: 14.9 % (ref 11.5–15.5)
WBC: 3.3 10*3/uL — ABNORMAL LOW (ref 4.0–10.5)
nRBC: 0 % (ref 0.0–0.2)

## 2023-03-28 LAB — COMPREHENSIVE METABOLIC PANEL
ALT: 20 U/L (ref 0–44)
AST: 18 U/L (ref 15–41)
Albumin: 3.3 g/dL — ABNORMAL LOW (ref 3.5–5.0)
Alkaline Phosphatase: 124 U/L (ref 38–126)
Anion gap: 11 (ref 5–15)
BUN: 11 mg/dL (ref 8–23)
CO2: 22 mmol/L (ref 22–32)
Calcium: 8.9 mg/dL (ref 8.9–10.3)
Chloride: 102 mmol/L (ref 98–111)
Creatinine, Ser: 1.36 mg/dL — ABNORMAL HIGH (ref 0.61–1.24)
GFR, Estimated: 56 mL/min — ABNORMAL LOW (ref >60)
Glucose, Bld: 120 mg/dL — ABNORMAL HIGH (ref 70–99)
Potassium: 3.2 mmol/L — ABNORMAL LOW (ref 3.5–5.1)
Sodium: 135 mmol/L (ref 135–145)
Total Bilirubin: 1.5 mg/dL — ABNORMAL HIGH (ref 0.3–1.2)
Total Protein: 7.1 g/dL (ref 6.5–8.1)

## 2023-03-28 LAB — MAGNESIUM: Magnesium: 2 mg/dL (ref 1.7–2.4)

## 2023-03-28 MED ORDER — PALONOSETRON HCL INJECTION 0.25 MG/5ML
0.2500 mg | Freq: Once | INTRAVENOUS | Status: AC
  Administered 2023-03-28: 0.25 mg via INTRAVENOUS
  Filled 2023-03-28: qty 5

## 2023-03-28 MED ORDER — SODIUM CHLORIDE 0.9% FLUSH
10.0000 mL | Freq: Once | INTRAVENOUS | Status: AC
  Administered 2023-03-28: 10 mL via INTRAVENOUS

## 2023-03-28 MED ORDER — MAGNESIUM SULFATE 2 GM/50ML IV SOLN
2.0000 g | Freq: Once | INTRAVENOUS | Status: AC
  Administered 2023-03-28: 2 g via INTRAVENOUS
  Filled 2023-03-28: qty 50

## 2023-03-28 MED ORDER — MEGESTROL ACETATE 400 MG/10ML PO SUSP: 400.0000 mg | Freq: Two times a day (BID) | ORAL | 4 refills | Status: DC

## 2023-03-28 MED ORDER — PEGFILGRASTIM 6 MG/0.6ML ~~LOC~~ PSKT
6.0000 mg | PREFILLED_SYRINGE | Freq: Once | SUBCUTANEOUS | Status: AC
  Administered 2023-03-28: 6 mg via SUBCUTANEOUS
  Filled 2023-03-28: qty 0.6

## 2023-03-28 MED ORDER — HYDROMORPHONE HCL 1 MG/ML IJ SOLN
2.0000 mg | Freq: Once | INTRAMUSCULAR | Status: AC
  Administered 2023-03-28: 2 mg via INTRAVENOUS
  Filled 2023-03-28: qty 2

## 2023-03-28 MED ORDER — SODIUM CHLORIDE 0.9 % IV SOLN
60.0000 mg/m2 | Freq: Once | INTRAVENOUS | Status: AC
  Administered 2023-03-28: 139 mg via INTRAVENOUS
  Filled 2023-03-28: qty 13.9

## 2023-03-28 MED ORDER — SODIUM CHLORIDE 0.9% FLUSH
10.0000 mL | INTRAVENOUS | Status: DC | PRN
  Administered 2023-03-28: 10 mL

## 2023-03-28 MED ORDER — POTASSIUM CHLORIDE IN NACL 20-0.9 MEQ/L-% IV SOLN
Freq: Once | INTRAVENOUS | Status: AC
  Filled 2023-03-28: qty 1000

## 2023-03-28 MED ORDER — SODIUM CHLORIDE 0.9 % IV SOLN: Freq: Once | INTRAVENOUS | Status: AC

## 2023-03-28 MED ORDER — HEPARIN SOD (PORK) LOCK FLUSH 100 UNIT/ML IV SOLN
500.0000 [IU] | Freq: Once | INTRAVENOUS | Status: AC | PRN
  Administered 2023-03-28: 500 [IU]

## 2023-03-28 MED ORDER — SODIUM CHLORIDE 0.9 % IV SOLN
10.0000 mg | Freq: Once | INTRAVENOUS | Status: AC
  Administered 2023-03-28: 10 mg via INTRAVENOUS
  Filled 2023-03-28: qty 10

## 2023-03-28 MED ORDER — SODIUM CHLORIDE 0.9 % IV SOLN
150.0000 mg | Freq: Once | INTRAVENOUS | Status: AC
  Administered 2023-03-28: 150 mg via INTRAVENOUS
  Filled 2023-03-28: qty 5

## 2023-03-28 MED ORDER — OXYCODONE HCL 5 MG PO TABS: 10.0000 mg | ORAL_TABLET | Freq: Four times a day (QID) | ORAL | 0 refills | Status: DC | PRN

## 2023-03-28 NOTE — Progress Notes (Signed)
Treatment given today per MD orders. Tolerated infusion without adverse affects. Vital signs stable. No complaints at this time. Discharged from clinic by wheel chair in stable condition. Alert and oriented x 3. F/U with [facility] as scheduled.   

## 2023-03-28 NOTE — Patient Instructions (Addendum)
[facility] at [facility] Discharge Instructions   You were seen and examined today by Dr. Ellin Saba.  He reviewed the results of your lab work which are normal/stable.   You should pick up a stool softener (Colace/docusate sodium) two pills twice a day to help prevent constipation. The pain medication you take may cause severe constipation. The stool softener will help prevent this.   We will proceed with your treatment today.   Return as scheduled.    Thank you for choosing [facility] at [facility] to provide your oncology and hematology care.  To afford each patient quality time with our provider, please arrive at least 15 minutes before your scheduled appointment time.   If you have a lab appointment with the Cancer Center please come in thru the Main Entrance and check in at the main information desk.  You need to re-schedule your appointment should you arrive 10 or more minutes late.  We strive to give you quality time with our providers, and arriving late affects you and other patients whose appointments are after yours.  Also, if you no show three or more times for appointments you may be dismissed from the clinic at the providers discretion.     Again, thank you for choosing [facility].  Our hope is that these requests will decrease the amount of time that you wait before being seen by our physicians.       _____________________________________________________________  Should you have questions after your visit to [facility], please contact our office at [phone] and follow the prompts.  Our office hours are 8:00 a.m. and 4:30 p.m. Monday - Friday.  Please note that voicemails left after 4:00 p.m. may not be returned until the following business day.  We are closed weekends and major holidays.  You do have access to a nurse 24-7, just call the main number to the clinic [phone] and do not press any  options, hold on the line and a nurse will answer the phone.    For prescription refill requests, have your pharmacy contact our office and allow 72 hours.    Due to Covid, you will need to wear a mask upon entering the hospital. If you do not have a mask, a mask will be given to you at the Main Entrance upon arrival. For doctor visits, patients may have 1 support person age 83 or older with them. For treatment visits, patients can not have anyone with them due to social distancing guidelines and our immunocompromised population.

## 2023-03-28 NOTE — Patient Instructions (Signed)
[facility] [place]  Discharge Instructions: Thank you for choosing [facility] to provide your oncology and hematology care.  If you have a lab appointment with the Cancer Center - please note that after April 8th, 2024, all labs will be drawn in the cancer center.  You do not have to check in or register with the main entrance as you have in the past but will complete your check-in in the cancer center.  Wear comfortable clothing and clothing appropriate for easy access to any Portacath or PICC line.   We strive to give you quality time with your provider. You may need to reschedule your appointment if you arrive late (15 or more minutes).  Arriving late affects you and other patients whose appointments are after yours.  Also, if you miss three or more appointments without notifying the office, you may be dismissed from the clinic at the provider's discretion.      For prescription refill requests, have your pharmacy contact our office and allow 72 hours for refills to be completed.    Today you received the following chemotherapy and/or immunotherapy agents Taxotere. Docetaxel Injection What is this medication? DOCETAXEL (doe se TAX el) treats some types of cancer. It works by slowing down the growth of cancer cells. This medicine may be used for other purposes; ask your health care provider or pharmacist if you have questions. COMMON BRAND NAME(S): Docefrez, Taxotere What should I tell my care team before I take this medication? They need to know if you have any of these conditions: Kidney disease Liver disease Low white blood cell levels Tingling of the fingers or toes or other nerve disorder An unusual or allergic reaction to docetaxel, polysorbate 80, other medications, foods, dyes, or preservatives Pregnant or trying to get pregnant Breast-feeding How should I use this medication? This medication is injected into a vein. It is given by your care team in a  hospital or clinic setting. Talk to your care team about the use of this medication in children. Special care may be needed. Overdosage: If you think you have taken too much of this medicine contact a poison control center or emergency room at once. NOTE: This medicine is only for you. Do not share this medicine with others. What if I miss a dose? Keep appointments for follow-up doses. It is important not to miss your dose. Call your care team if you are unable to keep an appointment. What may interact with this medication? Do not take this medication with any of the following: Live virus vaccines This medication may also interact with the following: Certain antibiotics, such as clarithromycin, telithromycin Certain antivirals for HIV or hepatitis Certain medications for fungal infections, such as itraconazole, ketoconazole, voriconazole Grapefruit juice Nefazodone Supplements, such as St. John's wort This list may not describe all possible interactions. Give your health care provider a list of all the medicines, herbs, non-prescription drugs, or dietary supplements you use. Also tell them if you smoke, drink alcohol, or use illegal drugs. Some items may interact with your medicine. What should I watch for while using this medication? This medication may make you feel generally unwell. This is not uncommon as chemotherapy can affect healthy cells as well as cancer cells. Report any side effects. Continue your course of treatment even though you feel ill unless your care team tells you to stop. You may need blood work done while you are taking this medication. This medication can cause serious side effects   and infusion reactions. To reduce the risk, your care team may give you other medications to take before receiving this one. Be sure to follow the directions from your care team. This medication may increase your risk of getting an infection. Call your care team for advice if you get a fever,  chills, sore throat, or other symptoms of a cold or flu. Do not treat yourself. Try to avoid being around people who are sick. Avoid taking medications that contain aspirin, acetaminophen, ibuprofen, naproxen, or ketoprofen unless instructed by your care team. These medications may hide a fever. Be careful brushing or flossing your teeth or using a toothpick because you may get an infection or bleed more easily. If you have any dental work done, tell your dentist you are receiving this medication. Some products may contain alcohol. Ask your care team if this medication contains alcohol. Be sure to tell all care teams you are taking this medicine. Certain medications, like metronidazole and disulfiram, can cause an unpleasant reaction when taken with alcohol. The reaction includes flushing, headache, nausea, vomiting, sweating, and increased thirst. The reaction can last from 30 minutes to several hours. This medication may affect your coordination, reaction time, or judgement. Do not drive or operate machinery until you know how this medication affects you. Sit up or stand slowly to reduce the risk of dizzy or fainting spells. Drinking alcohol with this medication can increase the risk of these side effects. Talk to your care team about your risk of cancer. You may be more at risk for certain types of cancer if you take this medication. Talk to your care team if you wish to become pregnant or think you might be pregnant. This medication can cause serious birth defects if taken during pregnancy or if you get pregnant within 2 months after stopping therapy. A negative pregnancy test is required before starting this medication. A reliable form of contraception is recommended while taking this medication and for 2 months after stopping it. Talk to your care team about reliable forms of contraception. Do not breast-feed while taking this medication and for 1 week after stopping therapy. Use a condom during sex  and for 4 months after stopping therapy. Tell your care team right away if you think your partner might be pregnant. This medication can cause serious birth defects. This medication may cause infertility. Talk to your care team if you are concerned about your fertility. What side effects may I notice from receiving this medication? Side effects that you should report to your care team as soon as possible: Allergic reactions--skin rash, itching, hives, swelling of the face, lips, tongue, or throat Change in vision such as blurry vision, seeing halos around lights, vision loss Infection--fever, chills, cough, or sore throat Infusion reactions--chest pain, shortness of breath or trouble breathing, feeling faint or lightheaded Low red blood cell level--unusual weakness or fatigue, dizziness, headache, trouble breathing Pain, tingling, or numbness in the hands or feet Painful swelling, warmth, or redness of the skin, blisters or sores at the infusion site Redness, blistering, peeling, or loosening of the skin, including inside the mouth Sudden or severe stomach pain, bloody diarrhea, fever, nausea, vomiting Swelling of the ankles, hands, or feet Tumor lysis syndrome (TLS)--nausea, vomiting, diarrhea, decrease in the amount of urine, dark urine, unusual weakness or fatigue, confusion, muscle pain or cramps, fast or irregular heartbeat, joint pain Unusual bruising or bleeding Side effects that usually do not require medical attention (report to your care team if they   continue or are bothersome): Change in nail shape, thickness, or color Change in taste Hair loss Increased tears This list may not describe all possible side effects. Call your doctor for medical advice about side effects. You may report side effects to FDA at 1-800-FDA-1088. Where should I keep my medication? This medication is given in a hospital or clinic. It will not be stored at home. NOTE: This sheet is a summary. It may not cover  all possible information. If you have questions about this medicine, talk to your doctor, pharmacist, or health care provider.  2023 Elsevier/Gold Standard (2007-12-21 00:00:00)       To help prevent nausea and vomiting after your treatment, we encourage you to take your nausea medication as directed.  BELOW ARE SYMPTOMS THAT SHOULD BE REPORTED IMMEDIATELY: *FEVER GREATER THAN 100.4 F (38 C) OR HIGHER *CHILLS OR SWEATING *NAUSEA AND VOMITING THAT IS NOT CONTROLLED WITH YOUR NAUSEA MEDICATION *UNUSUAL SHORTNESS OF BREATH *UNUSUAL BRUISING OR BLEEDING *URINARY PROBLEMS (pain or burning when urinating, or frequent urination) *BOWEL PROBLEMS (unusual diarrhea, constipation, pain near the anus) TENDERNESS IN MOUTH AND THROAT WITH OR WITHOUT PRESENCE OF ULCERS (sore throat, sores in mouth, or a toothache) UNUSUAL RASH, SWELLING OR PAIN  UNUSUAL VAGINAL DISCHARGE OR ITCHING   Items with * indicate a potential emergency and should be followed up as soon as possible or go to the Emergency Department if any problems should occur.  Please show the CHEMOTHERAPY ALERT CARD or IMMUNOTHERAPY ALERT CARD at check-in to the Emergency Department and triage nurse.  Should you have questions after your visit or need to cancel or reschedule your appointment, please contact [facility] [place] [phone]  and follow the prompts.  Office hours are 8:00 a.m. to 4:30 p.m. Monday - Friday. Please note that voicemails left after 4:00 p.m. may not be returned until the following business day.  We are closed weekends and major holidays. You have access to a nurse at all times for urgent questions. Please call the main number to the clinic [phone] and follow the prompts.  For any non-urgent questions, you may also contact your provider using MyChart. We now offer e-Visits for anyone 18 and older to request care online for non-urgent symptoms. For details visit [email or web address].   Also  download the MyChart app! Go to the app store, search "MyChart", open the app, select [place], and log in with your MyChart username and password.   

## 2023-03-28 NOTE — Progress Notes (Signed)
Patient presents today for chemotherapy infusion of Taxotere. Patient is in satisfactory but voices c/o severe back pain and constipation in which Dr Ellin Saba is aware. Vital signs are stable. Labs reviewed by Dr. Ellin Saba during the office visit and all labs are within treatment parameters.  We will proceed with treatment per MD orders with added orders of House IVF, Dilaudid, and Onpro.

## 2023-03-29 ENCOUNTER — Other Ambulatory Visit (HOSPITAL_COMMUNITY): Payer: Self-pay

## 2023-03-29 ENCOUNTER — Encounter (HOSPITAL_COMMUNITY): Payer: Self-pay

## 2023-03-30 ENCOUNTER — Inpatient Hospital Stay: Payer: Medicare Other

## 2023-03-30 ENCOUNTER — Ambulatory Visit: Payer: Medicare Other | Admitting: Urology

## 2023-03-30 DIAGNOSIS — N200 Calculus of kidney: Secondary | ICD-10-CM

## 2023-04-03 ENCOUNTER — Other Ambulatory Visit: Payer: Self-pay | Admitting: Hematology

## 2023-04-03 DIAGNOSIS — Z95828 Presence of other vascular implants and grafts: Secondary | ICD-10-CM

## 2023-04-03 DIAGNOSIS — C61 Malignant neoplasm of prostate: Secondary | ICD-10-CM

## 2023-04-04 ENCOUNTER — Other Ambulatory Visit: Payer: Self-pay

## 2023-04-04 DIAGNOSIS — Z95828 Presence of other vascular implants and grafts: Secondary | ICD-10-CM

## 2023-04-04 DIAGNOSIS — C61 Malignant neoplasm of prostate: Secondary | ICD-10-CM

## 2023-04-05 ENCOUNTER — Other Ambulatory Visit (HOSPITAL_COMMUNITY): Payer: Self-pay

## 2023-04-06 ENCOUNTER — Inpatient Hospital Stay: Payer: Medicare Other

## 2023-04-06 DIAGNOSIS — C61 Malignant neoplasm of prostate: Secondary | ICD-10-CM

## 2023-04-06 DIAGNOSIS — Z5111 Encounter for antineoplastic chemotherapy: Secondary | ICD-10-CM | POA: Diagnosis not present

## 2023-04-06 DIAGNOSIS — Z95828 Presence of other vascular implants and grafts: Secondary | ICD-10-CM

## 2023-04-06 LAB — COMPREHENSIVE METABOLIC PANEL
ALT: 22 U/L (ref 0–44)
AST: 19 U/L (ref 15–41)
Albumin: 3.4 g/dL — ABNORMAL LOW (ref 3.5–5.0)
Alkaline Phosphatase: 128 U/L — ABNORMAL HIGH (ref 38–126)
Anion gap: 14 (ref 5–15)
BUN: 13 mg/dL (ref 8–23)
CO2: 21 mmol/L — ABNORMAL LOW (ref 22–32)
Calcium: 8.7 mg/dL — ABNORMAL LOW (ref 8.9–10.3)
Chloride: 97 mmol/L — ABNORMAL LOW (ref 98–111)
Creatinine, Ser: 1.42 mg/dL — ABNORMAL HIGH (ref 0.61–1.24)
GFR, Estimated: 53 mL/min — ABNORMAL LOW (ref >60)
Glucose, Bld: 126 mg/dL — ABNORMAL HIGH (ref 70–99)
Potassium: 3 mmol/L — ABNORMAL LOW (ref 3.5–5.1)
Sodium: 132 mmol/L — ABNORMAL LOW (ref 135–145)
Total Bilirubin: 1.3 mg/dL — ABNORMAL HIGH (ref 0.3–1.2)
Total Protein: 7.4 g/dL (ref 6.5–8.1)

## 2023-04-06 LAB — CBC WITH DIFFERENTIAL/PLATELET
Abs Immature Granulocytes: 0.2 10*3/uL — ABNORMAL HIGH (ref 0.00–0.07)
Band Neutrophils: 3 %
Basophils Absolute: 0 10*3/uL (ref 0.0–0.1)
Basophils Relative: 0 %
Eosinophils Absolute: 0 10*3/uL (ref 0.0–0.5)
Eosinophils Relative: 0 %
HCT: 32 % — ABNORMAL LOW (ref 39.0–52.0)
Hemoglobin: 10.5 g/dL — ABNORMAL LOW (ref 13.0–17.0)
Lymphocytes Relative: 1 %
Lymphs Abs: 0.1 10*3/uL — ABNORMAL LOW (ref 0.7–4.0)
MCH: 26.8 pg (ref 26.0–34.0)
MCHC: 32.8 g/dL (ref 30.0–36.0)
MCV: 81.6 fL (ref 80.0–100.0)
Metamyelocytes Relative: 3 %
Monocytes Absolute: 0.3 10*3/uL (ref 0.1–1.0)
Monocytes Relative: 5 %
Neutro Abs: 4.9 10*3/uL (ref 1.7–7.7)
Neutrophils Relative %: 88 %
Platelets: 116 10*3/uL — ABNORMAL LOW (ref 150–400)
RBC: 3.92 MIL/uL — ABNORMAL LOW (ref 4.22–5.81)
RDW: 14.6 % (ref 11.5–15.5)
WBC: 5.4 10*3/uL (ref 4.0–10.5)
nRBC: 0.7 % — ABNORMAL HIGH (ref 0.0–0.2)

## 2023-04-06 LAB — MAGNESIUM: Magnesium: 2 mg/dL (ref 1.7–2.4)

## 2023-04-06 MED ORDER — SODIUM CHLORIDE 0.9% FLUSH
10.0000 mL | INTRAVENOUS | Status: DC | PRN
  Administered 2023-04-06: 10 mL via INTRAVENOUS

## 2023-04-06 MED ORDER — OXYCODONE HCL 5 MG PO TABS
5.0000 mg | ORAL_TABLET | Freq: Once | ORAL | Status: AC
  Administered 2023-04-06: 5 mg via ORAL
  Filled 2023-04-06: qty 1

## 2023-04-06 MED ORDER — ONDANSETRON HCL 4 MG/2ML IJ SOLN
8.0000 mg | Freq: Once | INTRAMUSCULAR | Status: AC
  Administered 2023-04-06: 8 mg via INTRAVENOUS
  Filled 2023-04-06: qty 4

## 2023-04-06 MED ORDER — HEPARIN SOD (PORK) LOCK FLUSH 100 UNIT/ML IV SOLN
500.0000 [IU] | Freq: Once | INTRAVENOUS | Status: AC
  Administered 2023-04-06: 500 [IU] via INTRAVENOUS

## 2023-04-06 MED ORDER — MAGNESIUM SULFATE 2 GM/50ML IV SOLN
2.0000 g | Freq: Once | INTRAVENOUS | Status: AC
  Administered 2023-04-06: 2 g via INTRAVENOUS
  Filled 2023-04-06: qty 50

## 2023-04-06 MED ORDER — POTASSIUM CHLORIDE IN NACL 20-0.9 MEQ/L-% IV SOLN
Freq: Once | INTRAVENOUS | Status: AC
  Filled 2023-04-06: qty 1000

## 2023-04-06 NOTE — Progress Notes (Signed)
Patients port flushed without difficulty.  Good blood return noted with no bruising or swelling noted at site.  Band aid applied.  VSS with discharge and left in satisfactory condition with no s/s of distress noted.   

## 2023-04-06 NOTE — Progress Notes (Signed)
Patients port flushed without difficulty.  Good blood return noted with no bruising or swelling noted at site.  Patient remains accessed for IVF.  

## 2023-04-06 NOTE — Progress Notes (Signed)
Hydration fluids with potassium and magnesium given per orders. Patient tolerated it well without problems. Vitals stable and discharged home from clinic via wheelchair.  Follow up as scheduled.

## 2023-04-06 NOTE — Patient Instructions (Signed)
[facility]  Discharge Instructions: Thank you for choosing [facility] to provide your oncology and hematology care.  If you have a lab appointment with the Cancer Center - please note that after April 8th, 2024, all labs will be drawn in the cancer center.  You do not have to check in or register with the main entrance as you have in the past but will complete your check-in in the cancer center.  Wear comfortable clothing and clothing appropriate for easy access to any Portacath or PICC line.   We strive to give you quality time with your provider. You may need to reschedule your appointment if you arrive late (15 or more minutes).  Arriving late affects you and other patients whose appointments are after yours.  Also, if you miss three or more appointments without notifying the office, you may be dismissed from the clinic at the provider's discretion.      For prescription refill requests, have your pharmacy contact our office and allow 72 hours for refills to be completed.    Today you received hydration fluids with potassium and magnesium   To help prevent nausea and vomiting after your treatment, we encourage you to take your nausea medication as directed.  BELOW ARE SYMPTOMS THAT SHOULD BE REPORTED IMMEDIATELY: *FEVER GREATER THAN 100.4 F (38 C) OR HIGHER *CHILLS OR SWEATING *NAUSEA AND VOMITING THAT IS NOT CONTROLLED WITH YOUR NAUSEA MEDICATION *UNUSUAL SHORTNESS OF BREATH *UNUSUAL BRUISING OR BLEEDING *URINARY PROBLEMS (pain or burning when urinating, or frequent urination) *BOWEL PROBLEMS (unusual diarrhea, constipation, pain near the anus) TENDERNESS IN MOUTH AND THROAT WITH OR WITHOUT PRESENCE OF ULCERS (sore throat, sores in mouth, or a toothache) UNUSUAL RASH, SWELLING OR PAIN  UNUSUAL VAGINAL DISCHARGE OR ITCHING   Items with * indicate a potential emergency and should be followed up as soon as possible or go to the Emergency Department if  any problems should occur.  Please show the CHEMOTHERAPY ALERT CARD or IMMUNOTHERAPY ALERT CARD at check-in to the Emergency Department and triage nurse.  Should you have questions after your visit or need to cancel or reschedule your appointment, please contact [facility] [phone]  and follow the prompts.  Office hours are 8:00 a.m. to 4:30 p.m. Monday - Friday. Please note that voicemails left after 4:00 p.m. may not be returned until the following business day.  We are closed weekends and major holidays. You have access to a nurse at all times for urgent questions. Please call the main number to the clinic [phone] and follow the prompts.  For any non-urgent questions, you may also contact your provider using MyChart. We now offer e-Visits for anyone 38 and older to request care online for non-urgent symptoms. For details visit [email or web address].   Also download the MyChart app! Go to the app store, search "MyChart", open the app, select , and log in with your MyChart username and password.

## 2023-04-10 ENCOUNTER — Other Ambulatory Visit (HOSPITAL_COMMUNITY): Payer: Self-pay

## 2023-04-12 ENCOUNTER — Other Ambulatory Visit: Payer: Self-pay

## 2023-04-12 ENCOUNTER — Telehealth: Payer: Self-pay | Admitting: Dietician

## 2023-04-12 ENCOUNTER — Ambulatory Visit: Payer: Medicare Other

## 2023-04-12 ENCOUNTER — Other Ambulatory Visit (HOSPITAL_COMMUNITY): Payer: Self-pay

## 2023-04-12 ENCOUNTER — Inpatient Hospital Stay: Payer: Medicare Other | Admitting: Dietician

## 2023-04-12 ENCOUNTER — Encounter (HOSPITAL_COMMUNITY): Payer: Self-pay

## 2023-04-12 DIAGNOSIS — C61 Malignant neoplasm of prostate: Secondary | ICD-10-CM

## 2023-04-12 DIAGNOSIS — Z95828 Presence of other vascular implants and grafts: Secondary | ICD-10-CM

## 2023-04-12 NOTE — Telephone Encounter (Signed)
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 He is receiving docetaxel + darolutamide (fisrt 4/24). Patient is under the care of Dr. Ellin Saba  Past medical history includes HTN, COPD, HLD, anemia, chronic pain  Spoke with patient son [name]) via telephone. He provides history today. Jamal reports "every day is different" since start of treatment. He is unable to eat much and experiencing taste changes. Patient started appetite stimulant (megace) ~one week ago. Reports no noticeable difference in appetite. Patient has had a few bites of banana and water today. Patient is not drinking oral nutrition supplements at this time. Son reports frequent vomiting episodes lasting over a week following first treatment. Pt associates Ensure/Boost with this as he was trying to drink them during that time. Jamal reports pt has not had nausea, vomiting, constipation with second cycle. He is glad for this. Patient is fatigued.    Nutrition Focused Physical Exam: deferred - telephone visit   Medications: proscar, ferrous sulfate, megace, MVI, zofran, oxycodone, compazine, crestor, flomax, valsartan   Labs: 5/24 - Na 132, K 3.0, glucose 126, Cr 1.42, albumin 3.4   Anthropometrics:   Height: 5'10" Weight: 209 lb 7 oz (5/15) UBW: 246 lb (2/9) BMI: 30.05   NUTRITION DIAGNOSIS: Unintended weight loss related to cancer as evidenced by 15% (37 lb) wt decrease in 3 months - severe for time frame   MALNUTRITION DIAGNOSIS: Highly suspect degree of malnutrition given wt loss and nutrition history per family, however unable to identify without NFPE   INTERVENTION:  Educated on strategies for altered taste and importance of oral care. Suggested baking soda salt water rinses several times daily and before meals - recipe provided to son over phone Discussed strategies for poor appetite, encouraged small frequent  meal/snacks - eating by the clock Discussed ways to add calories and protein to foods (using whole milk vs water in soups/oatmeal, adding cheese/butter/gravy/sauces, encouraged bedtime snack) Suggested CIB mixed with milk as alternate oral supplement  Handouts + samples of CIB powder left with nursing staff to give pt at 5/31 appointment (son aware)   MONITORING, EVALUATION, GOAL: weight trends, intake   Next Visit: Monday July 1 via telephone

## 2023-04-13 ENCOUNTER — Inpatient Hospital Stay: Payer: Medicare Other

## 2023-04-13 VITALS — BP 125/60 | HR 80 | Temp 97.0°F | Resp 20

## 2023-04-13 DIAGNOSIS — C61 Malignant neoplasm of prostate: Secondary | ICD-10-CM

## 2023-04-13 DIAGNOSIS — Z5111 Encounter for antineoplastic chemotherapy: Secondary | ICD-10-CM | POA: Diagnosis not present

## 2023-04-13 DIAGNOSIS — M545 Low back pain, unspecified: Secondary | ICD-10-CM

## 2023-04-13 LAB — CBC WITH DIFFERENTIAL/PLATELET
Abs Immature Granulocytes: 0.03 10*3/uL (ref 0.00–0.07)
Basophils Absolute: 0 10*3/uL (ref 0.0–0.1)
Basophils Relative: 1 %
Eosinophils Absolute: 0 10*3/uL (ref 0.0–0.5)
Eosinophils Relative: 0 %
HCT: 33.5 % — ABNORMAL LOW (ref 39.0–52.0)
Hemoglobin: 11 g/dL — ABNORMAL LOW (ref 13.0–17.0)
Immature Granulocytes: 1 %
Lymphocytes Relative: 8 %
Lymphs Abs: 0.5 10*3/uL — ABNORMAL LOW (ref 0.7–4.0)
MCH: 27.2 pg (ref 26.0–34.0)
MCHC: 32.8 g/dL (ref 30.0–36.0)
MCV: 82.7 fL (ref 80.0–100.0)
Monocytes Absolute: 0.6 10*3/uL (ref 0.1–1.0)
Monocytes Relative: 9 %
Neutro Abs: 5.4 10*3/uL (ref 1.7–7.7)
Neutrophils Relative %: 81 %
Platelets: 264 10*3/uL (ref 150–400)
RBC: 4.05 MIL/uL — ABNORMAL LOW (ref 4.22–5.81)
RDW: 15.6 % — ABNORMAL HIGH (ref 11.5–15.5)
WBC: 6.5 10*3/uL (ref 4.0–10.5)
nRBC: 0 % (ref 0.0–0.2)

## 2023-04-13 LAB — COMPREHENSIVE METABOLIC PANEL
ALT: 35 U/L (ref 0–44)
AST: 31 U/L (ref 15–41)
Albumin: 3.6 g/dL (ref 3.5–5.0)
Alkaline Phosphatase: 118 U/L (ref 38–126)
Anion gap: 16 — ABNORMAL HIGH (ref 5–15)
BUN: 11 mg/dL (ref 8–23)
CO2: 18 mmol/L — ABNORMAL LOW (ref 22–32)
Calcium: 8.9 mg/dL (ref 8.9–10.3)
Chloride: 98 mmol/L (ref 98–111)
Creatinine, Ser: 1.5 mg/dL — ABNORMAL HIGH (ref 0.61–1.24)
GFR, Estimated: 49 mL/min — ABNORMAL LOW (ref >60)
Glucose, Bld: 103 mg/dL — ABNORMAL HIGH (ref 70–99)
Potassium: 3.2 mmol/L — ABNORMAL LOW (ref 3.5–5.1)
Sodium: 132 mmol/L — ABNORMAL LOW (ref 135–145)
Total Bilirubin: 1.6 mg/dL — ABNORMAL HIGH (ref 0.3–1.2)
Total Protein: 7.2 g/dL (ref 6.5–8.1)

## 2023-04-13 LAB — MAGNESIUM: Magnesium: 1.8 mg/dL (ref 1.7–2.4)

## 2023-04-13 MED ORDER — POTASSIUM CHLORIDE IN NACL 20-0.9 MEQ/L-% IV SOLN
Freq: Once | INTRAVENOUS | Status: AC
  Filled 2023-04-13: qty 1000

## 2023-04-13 MED ORDER — OXYCODONE HCL 5 MG PO TABS
5.0000 mg | ORAL_TABLET | Freq: Once | ORAL | Status: AC
  Administered 2023-04-13: 5 mg via ORAL
  Filled 2023-04-13: qty 1

## 2023-04-13 MED ORDER — SODIUM CHLORIDE 0.9% FLUSH
10.0000 mL | Freq: Once | INTRAVENOUS | Status: AC | PRN
  Administered 2023-04-13: 10 mL

## 2023-04-13 MED ORDER — HEPARIN SOD (PORK) LOCK FLUSH 100 UNIT/ML IV SOLN
500.0000 [IU] | Freq: Once | INTRAVENOUS | Status: AC | PRN
  Administered 2023-04-13: 500 [IU]

## 2023-04-13 MED ORDER — SODIUM CHLORIDE 0.9% FLUSH
10.0000 mL | INTRAVENOUS | Status: AC
  Administered 2023-04-13: 10 mL

## 2023-04-13 MED ORDER — MAGNESIUM SULFATE 2 GM/50ML IV SOLN
2.0000 g | Freq: Once | INTRAVENOUS | Status: AC
  Administered 2023-04-13: 2 g via INTRAVENOUS
  Filled 2023-04-13: qty 50

## 2023-04-13 NOTE — Progress Notes (Signed)
Patient presents today for IVF per orders, patient request pain medication. Patient states he only took 1 tablet of oxycodone, Per Dr. Ellin Saba patient okay to receive 5mg  of oxycodone in clinic. Patient tolerated hydration therapy with no complaints voiced. Side effects with management reviewed with understanding verbalized. Port site clean and dry with no bruising or swelling noted at site. Good blood return noted before and after administration of therapy. Band aid applied. Patient left in satisfactory condition with VSS and no s/s of distress noted.

## 2023-04-13 NOTE — Patient Instructions (Signed)
[facility]  Discharge Instructions: Thank you for choosing [facility] to provide your oncology and hematology care.  If you have a lab appointment with the Cancer Center - please note that after April 8th, 2024, all labs will be drawn in the cancer center.  You do not have to check in or register with the main entrance as you have in the past but will complete your check-in in the cancer center.  Wear comfortable clothing and clothing appropriate for easy access to any Portacath or PICC line.   We strive to give you quality time with your provider. You may need to reschedule your appointment if you arrive late (15 or more minutes).  Arriving late affects you and other patients whose appointments are after yours.  Also, if you miss three or more appointments without notifying the office, you may be dismissed from the clinic at the provider's discretion.      For prescription refill requests, have your pharmacy contact our office and allow 72 hours for refills to be completed.    Today you received the following Oxycodone 5 mg and house fluids, return as scheduled.   To help prevent nausea and vomiting after your treatment, we encourage you to take your nausea medication as directed.  BELOW ARE SYMPTOMS THAT SHOULD BE REPORTED IMMEDIATELY: *FEVER GREATER THAN 100.4 F (38 C) OR HIGHER *CHILLS OR SWEATING *NAUSEA AND VOMITING THAT IS NOT CONTROLLED WITH YOUR NAUSEA MEDICATION *UNUSUAL SHORTNESS OF BREATH *UNUSUAL BRUISING OR BLEEDING *URINARY PROBLEMS (pain or burning when urinating, or frequent urination) *BOWEL PROBLEMS (unusual diarrhea, constipation, pain near the anus) TENDERNESS IN MOUTH AND THROAT WITH OR WITHOUT PRESENCE OF ULCERS (sore throat, sores in mouth, or a toothache) UNUSUAL RASH, SWELLING OR PAIN  UNUSUAL VAGINAL DISCHARGE OR ITCHING   Items with * indicate a potential emergency and should be followed up as soon as possible or go to the  Emergency Department if any problems should occur.  Please show the CHEMOTHERAPY ALERT CARD or IMMUNOTHERAPY ALERT CARD at check-in to the Emergency Department and triage nurse.  Should you have questions after your visit or need to cancel or reschedule your appointment, please contact [facility] [phone]  and follow the prompts.  Office hours are 8:00 a.m. to 4:30 p.m. Monday - Friday. Please note that voicemails left after 4:00 p.m. may not be returned until the following business day.  We are closed weekends and major holidays. You have access to a nurse at all times for urgent questions. Please call the main number to the clinic [phone] and follow the prompts.  For any non-urgent questions, you may also contact your provider using MyChart. We now offer e-Visits for anyone 6 and older to request care online for non-urgent symptoms. For details visit [email or web address].   Also download the MyChart app! Go to the app store, search "MyChart", open the app, select , and log in with your MyChart username and password.

## 2023-04-17 NOTE — Progress Notes (Signed)
[name] [address] [place] [address]    Clinic Day:  04/18/2023  Referring physician: Tommie Sams, DO  Patient Care Team: Tommie Sams, DO as PCP - General (Family Medicine) Doreatha Massed, MD as Medical Oncologist (Medical Oncology) Therese Sarah, RN as Oncology Nurse Navigator (Medical Oncology)   ASSESSMENT & PLAN:   Assessment: 1.  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 Jeannette How in [place] Deborra Medina restarted on 12/04/2022, second dose 01/12/2023 - PSMA PET (11/30/2022): Radiotracer avid lesions involving left posteromedial ninth rib, T12 and L2 vertebral bodies.  Retroperitoneal and iliac side chain lymph nodes. - NGS: TMB-low, MS-stable.  BCOR and KMT2C pathogenic variants.  No other targetable mutations. - Docetaxel and darolutamide started on 03/07/2023   2.  Social/family history: -He lives at home with his son [name].  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 - Father had cancer, type not known to the patient.  Mother had breast cancer and died in her 68s.  1 brother had prostate cancer and another brother had pancreatic cancer.  1 other brother had cancer, type unknown to the patient.    Plan: 1.  Metastatic CRPC to the bones and lymph nodes: - Cycle 2 of docetaxel at 60 mg/m on 03/27/2020. - Patient did not have vomiting after dose reduction. - She is not taking new back reliably as some days he does not eat. - Reviewed  labs today: Creatinine 1.51.  CBC grossly normal.  LFTs are normal. - Patient's son reported that he had hematuria which cleared up and he was given antibiotic in the last few days. - While in the room, he had slumped in his chair.  He regained to his normal self.  Repeat blood pressure systolic was between 60-70. - We will hold off on giving treatment today.  I have recommended that he be evaluated in the ER for urosepsis. - I think he is faring poorly with chemotherapy.  Son reports that he is not moving much at home and mostly confined to the bed.  We will obtain PSMA PET scan.  If there is progression, will consider best supportive care in the form of hospice. - I have called and talked to Dr. Hyacinth Meeker in the ER and made him aware that patient is coming his way.   2. Nausea/Vomiting: -- Continue Compazine/Zofran/promethazine as needed.   3. Constipation: - Continue lactulose as needed.   4. Elevated creatinine levels: -- Likely from poor p.o. intake.  He will receive 1 L of fluids.   5. Weight loss: -- He lost about 11 pounds since last visit.  Continue Megace twice daily. - Recommend increasing nutritional supplements.   7. Left back pain radiating to the left groin: - He reports poor control of pain.  He is taking oxycodone 5 mg 2 tablets every 6 hours as needed. - Will start him on OxyContin 20 mg every 12 hours.  He will continue to use oxycodone 5 mg 2 tablets as needed for breakthrough pain.   8.  Bone  health: - We will discuss denosumab in the future.    Orders Placed This Encounter  Procedures   NM PET (PSMA) SKULL TO MID THIGH    Standing Status:   Future    Standing Expiration Date:   04/17/2024    Order Specific Question:   If indicated for the ordered procedure, I authorize the administration of a radiopharmaceutical per Radiology protocol    Answer:   Yes    Order Specific Question:   Preferred imaging location?    Answer:   [facility]      I,Katie  Daubenspeck,acting as a Neurosurgeon for [employer], MD.,have documented all relevant documentation on the behalf of Doreatha Massed, MD,as directed by  Doreatha Massed, MD while in the presence of Doreatha Massed, MD.   I, Doreatha Massed MD, have reviewed the above documentation for accuracy and completeness, and I agree with the above.   Doreatha Massed, MD   6/5/20245:55 PM  CHIEF COMPLAINT:   Diagnosis: metastatic prostate cancer    Cancer Staging  Prostate cancer [facility]) Staging form: Prostate, AJCC 8th Edition - Clinical stage from 08/25/2020: Stage IVB (cT1c, cN1, cM1c, PSA: 5.4, Grade Group: 5) - Unsigned    Prior Therapy: XRT to prostate/pelvic nodes 11/16/20 - 01/14/21   Current Therapy:  ADT, Docetaxel q 21 days    HISTORY OF PRESENT ILLNESS:   Oncology History  Prostate cancer (HCC)  08/30/2020 Initial Diagnosis   Prostate cancer   03/07/2023 -  Chemotherapy   Patient is on Treatment Plan : PROSTATE Docetaxel (75) q21d      Genetic Testing   No pathogenic variants identified on the [facility] Multi-Cancer+RNA panel. VUS in HOXB13 called c.314C>T and VUS in PMS2 called c.682G>A identified. The report date is 02/24/2023.  The Multi-Cancer + RNA Panel offered by [facility] includes sequencing and/or deletion/duplication analysis of the following 70 genes:  AIP*, ALK, APC*, ATM*, AXIN2*, BAP1*, BARD1*, BLM*, BMPR1A*, BRCA1*, BRCA2*, BRIP1*, CDC73*, CDH1*, CDK4, CDKN1B*, CDKN2A, CHEK2*, CTNNA1*, DICER1*, EPCAM, EGFR, FH*, FLCN*, GREM1, HOXB13, KIT, LZTR1, MAX*, MBD4, MEN1*, MET, MITF, MLH1*, MSH2*, MSH3*, MSH6*, MUTYH*, NF1*, NF2*, NTHL1*, PALB2*, PDGFRA, PMS2*, POLD1*, POLE*, POT1*, PRKAR1A*, PTCH1*, PTEN*, RAD51C*, RAD51D*, RB1*, RET, SDHA*, SDHAF2*, SDHB*, SDHC*, SDHD*, SMAD4*, SMARCA4*, SMARCB1*, SMARCE1*, STK11*, SUFU*, TMEM127*, TP53*, TSC1*, TSC2*, VHL*. RNA analysis is performed for * genes.      INTERVAL HISTORY:   Tony Powell is a 72 y.o. male presenting  to clinic today for follow up of metastatic prostate cancer. He was last seen by me on 03/28/23.  Today, he states that he is doing well overall. His appetite level is at 20%. His energy level is at 10%.  PAST MEDICAL HISTORY:   Past Medical History: Past Medical History:  Diagnosis Date   Abnormal stress test    Borderline hypertension    Chest discomfort    Atypical   DJD (degenerative joint disease)    Knees, lumbar sacral spine   Hyperlipidemia    Hypertension    Port-A-Cath in place 02/22/2023   Prostate cancer [facility])     Surgical History: Past Surgical History:  Procedure Laterality Date   BACK SURGERY     CYSTOSCOPY WITH FULGERATION  04/18/2021   Procedure: CYSTOSCOPY WITH FULGERATION of BLADDER NECK;  Surgeon: Malen Gauze, MD;  Location: [facility];  Service: Urology;;   CYSTOSCOPY WITH INJECTION N/A 04/18/2021   Procedure: CYSTOSCOPY WITH INJECTION- Botox 100 units;  Surgeon: Malen Gauze, MD;  Location: [facility];  Service: Urology;  Laterality: N/A;   CYSTOSCOPY WITH RETROGRADE PYELOGRAM, URETEROSCOPY AND STENT PLACEMENT Left 02/01/2023   Procedure: CYSTOSCOPY WITH RETROGRADE PYELOGRAM, URETEROSCOPY AND STENT PLACEMENT;  Surgeon: Malen Gauze, MD;  Location: [facility];  Service: Urology;  Laterality: Left;   IR FLUORO GUIDED NEEDLE PLC ASPIRATION/INJECTION LOC  02/23/2023   IR IMAGING GUIDED PORT INSERTION  02/19/2023   STONE EXTRACTION WITH BASKET Left 02/01/2023   Procedure: STONE EXTRACTION WITH BASKET;  Surgeon: Malen Gauze, MD;  Location: [facility];  Service: Urology;  Laterality: Left;   TRANSURETHRAL RESECTION OF BLADDER TUMOR N/A 08/25/2020   Procedure: TRANSURETHRAL RESECTION OF BLADDER TUMOR (TURBT);  Surgeon: Malen Gauze, MD;  Location: [facility];  Service: Urology;  Laterality: N/A;    Social History: Social History   Socioeconomic History   Marital status: Divorced    Spouse name: Not on file   Number of children: 2   Years of  education: Not on file   Highest education level: Not on file  Occupational History   Occupation: Long distance Scientist, water quality: [employer]  Tobacco Use   Smoking status: Former    Packs/day: 1.00    Years: 20.00    Additional pack years: 0.00    Total pack years: 20.00    Types: Cigarettes    Quit date: 11/13/1998    Years since quitting: 24.4    Passive exposure: Current   Smokeless tobacco: Never  Vaping Use   Vaping Use: Never used  Substance and Sexual Activity   Alcohol use: No   Drug use: Never   Sexual activity: Not Currently  Other Topics Concern   Not on file  Social History Narrative   Divorced   Social Determinants of Health   Financial Resource Strain: Low Risk  (09/14/2022)   Overall Financial Resource Strain (CARDIA)    Difficulty of Paying Living Expenses: Not hard at all  Food Insecurity: No Food Insecurity (02/15/2023)   Hunger Vital Sign    Worried About Running Out of Food in the Last Year: Never true    Ran Out of Food in the Last Year: Never true  Transportation Needs: No Transportation Needs (02/15/2023)   PRAPARE - Administrator, Civil Service (Medical): No    Lack of Transportation (Non-Medical): No  Physical Activity: Inactive (09/14/2022)   Exercise Vital Sign    Days of Exercise per Week: 0 days    Minutes of Exercise per Session: 0 min  Stress: No Stress Concern Present (09/14/2022)   [employer] of Occupational Health - Occupational Stress Questionnaire    Feeling of Stress : Only a little  Social Connections: Socially Isolated (09/14/2022)   Social Connection and Isolation Panel [NHANES]    Frequency of Communication with Friends and Family: Twice a week    Frequency of Social Gatherings with Friends and Family: Never    Attends Religious Services: 1 to 4 times per year    Active Member of [employer] or Organizations: No    Attends Banker Meetings: Never    Marital Status: Divorced  Catering manager  Violence: Not At Risk (02/15/2023)   Humiliation, Afraid, Rape, and Kick questionnaire    Fear of Current or Ex-Partner: No    Emotionally Abused: No    Physically Abused: No    Sexually Abused: No    Family History: Family History  Problem Relation Age of Onset   Heart attack Mother    Breast cancer Mother  Mastectomy   Leukemia Father    Cancer Brother    Cancer Brother    Cancer Brother    Cancer Brother    Cancer Brother    Colon cancer Neg Hx    Prostate cancer Neg Hx    Pancreatic cancer Neg Hx     Current Medications:  Current Outpatient Medications:    alfuzosin (UROXATRAL) 10 MG 24 hr tablet, Take 10 mg by mouth daily with breakfast., Disp: , Rfl:    cephALEXin (KEFLEX) 500 MG capsule, Take 500 mg by mouth 2 (two) times daily as needed (burning during urination)., Disp: , Rfl:    cyclobenzaprine (FLEXERIL) 5 MG tablet, Take 1 tablet (5 mg total) by mouth 3 (three) times daily as needed for muscle spasms., Disp: 30 tablet, Rfl: 0   darolutamide (NUBEQA) 300 MG tablet, Take 2 tablets (600 mg total) by mouth 2 (two) times daily with a meal., Disp: 120 tablet, Rfl: 3   DOCETAXEL IV, Inject into the vein every 21 ( twenty-one) days., Disp: , Rfl:    finasteride (PROSCAR) 5 MG tablet, Take 1 tablet (5 mg total) by mouth daily., Disp: 90 tablet, Rfl: 3   Iron, Ferrous Sulfate, 325 (65 Fe) MG TABS, Take 325 mg by mouth every other day., Disp: 45 tablet, Rfl: 1   lidocaine-prilocaine (EMLA) cream, Apply a quarter sized amount to port a cath site and cover with plastic wrap one hour prior to infusion appointments, Disp: 30 g, Rfl: 3   megestrol (MEGACE) 400 MG/10ML suspension, Take 10 mLs (400 mg total) by mouth 2 (two) times daily., Disp: 480 mL, Rfl: 4   Multiple Vitamin (MULTIVITAMIN) capsule, Take 1 capsule by mouth daily., Disp: , Rfl:    naproxen sodium (ALEVE) 220 MG tablet, Take 220 mg by mouth once., Disp: , Rfl:    ondansetron (ZOFRAN-ODT) 8 MG disintegrating  tablet, Take 1 tablet (8 mg total) by mouth every 8 (eight) hours as needed for nausea or vomiting., Disp: 90 tablet, Rfl: 0   oxybutynin (DITROPAN XL) 15 MG 24 hr tablet, TAKE ONE (1) TABLET (15 MG TOTAL) BY MOUTH DAILY. (Patient taking differently: Take 15 mg by mouth at bedtime.), Disp: 90 tablet, Rfl: 3   polyethylene glycol-electrolytes (NULYTELY) 420 g solution, Take by mouth., Disp: , Rfl:    prochlorperazine (COMPAZINE) 10 MG tablet, TAKE ONE (1) TABLET (10 MG TOTAL) BY MOUTH EVERY SIX (6) (SIX) HOURS AS NEEDED FOR NAUSEA OR VOMITING., Disp: 30 tablet, Rfl: 1   rosuvastatin (CRESTOR) 5 MG tablet, Take 1 tablet (5 mg total) by mouth daily., Disp: 90 tablet, Rfl: 1   sildenafil (VIAGRA) 100 MG tablet, Take 1 tablet (100 mg total) by mouth daily as needed for erectile dysfunction., Disp: 30 tablet, Rfl: 5   tamsulosin (FLOMAX) 0.4 MG CAPS capsule, Take 1 capsule (0.4 mg total) by mouth daily. (Patient not taking: Reported on 04/18/2023), Disp: 30 capsule, Rfl: 2   valsartan (DIOVAN) 160 MG tablet, Take 1 tablet (160 mg total) by mouth daily., Disp: 90 tablet, Rfl: 3   ciprofloxacin (CIPRO) 500 MG tablet, Take 1 tablet (500 mg total) by mouth 2 (two) times daily., Disp: 14 tablet, Rfl: 0   oxyCODONE (OXYCONTIN) 20 mg 12 hr tablet, Take 1 tablet (20 mg total) by mouth every 12 (twelve) hours., Disp: 60 tablet, Rfl: 0   oxyCODONE (ROXICODONE) 5 MG immediate release tablet, Take 2 tablets (10 mg total) by mouth every 6 (six) hours as needed for severe pain.,  Disp: 180 tablet, Rfl: 0 No current facility-administered medications for this visit.  Facility-Administered Medications Ordered in Other Visits:    0.9 %  sodium chloride infusion, , Intravenous, Continuous, Eber Hong, MD, Stopped at 04/18/23 1514   Allergies: Allergies  Allergen Reactions   Celebrex [Celecoxib]     Eyes drooping and messed up skin on hands    REVIEW OF SYSTEMS:   Review of Systems  Constitutional:  Negative for  chills, fatigue and fever.  HENT:   Negative for lump/mass, mouth sores, nosebleeds, sore throat and trouble swallowing.   Eyes:  Negative for eye problems.  Respiratory:  Positive for shortness of breath. Negative for cough.   Cardiovascular:  Negative for chest pain, leg swelling and palpitations.  Gastrointestinal:  Negative for abdominal pain, constipation, diarrhea, nausea and vomiting.  Genitourinary:  Positive for hematuria. Negative for bladder incontinence, difficulty urinating, dysuria, frequency and nocturia.   Musculoskeletal:  Negative for arthralgias, back pain, flank pain, myalgias and neck pain.  Skin:  Negative for itching and rash.  Neurological:  Negative for dizziness, headaches and numbness.  Hematological:  Does not bruise/bleed easily.  Psychiatric/Behavioral:  Negative for depression, sleep disturbance and suicidal ideas. The patient is not nervous/anxious.   All other systems reviewed and are negative.    VITALS:   Blood pressure (!) 64/40, pulse (!) 112, temperature 97.8 F (36.6 C), temperature source Axillary, resp. rate 20, weight 198 lb (89.8 kg), SpO2 100 %.  Wt Readings from Last 3 Encounters:  04/18/23 197 lb 15.9 oz (89.8 kg)  04/18/23 198 lb (89.8 kg)  03/28/23 209 lb 7 oz (95 kg)    Body mass index is 28.41 kg/m.  Performance status (ECOG): 2 - Symptomatic, <50% confined to bed  PHYSICAL EXAM:   Physical Exam Vitals and nursing note reviewed. Exam conducted with a chaperone present.  Constitutional:      Appearance: Normal appearance.  Cardiovascular:     Rate and Rhythm: Normal rate and regular rhythm.     Pulses: Normal pulses.     Heart sounds: Normal heart sounds.  Pulmonary:     Effort: Pulmonary effort is normal.     Breath sounds: Normal breath sounds.  Abdominal:     Palpations: Abdomen is soft. There is no hepatomegaly, splenomegaly or mass.     Tenderness: There is no abdominal tenderness.  Musculoskeletal:     Right lower  leg: No edema.     Left lower leg: No edema.  Lymphadenopathy:     Cervical: No cervical adenopathy.     Right cervical: No superficial, deep or posterior cervical adenopathy.    Left cervical: No superficial, deep or posterior cervical adenopathy.     Upper Body:     Right upper body: No supraclavicular or axillary adenopathy.     Left upper body: No supraclavicular or axillary adenopathy.  Neurological:     General: No focal deficit present.     Mental Status: He is alert and oriented to person, place, and time.  Psychiatric:        Mood and Affect: Mood normal.        Behavior: Behavior normal.     LABS:      Latest Ref Rng & Units 04/18/2023   11:40 AM 04/18/2023    9:36 AM 04/13/2023    9:29 AM  CBC  WBC 4.0 - 10.5 K/uL 7.6  7.1  6.5   Hemoglobin 13.0 - 17.0 g/dL 95.6  21.3  11.0  Hematocrit 39.0 - 52.0 % 34.8  34.0  33.5   Platelets 150 - 400 K/uL 395  451  264       Latest Ref Rng & Units 04/18/2023   11:40 AM 04/18/2023    9:36 AM 04/13/2023    9:29 AM  CMP  Glucose 70 - 99 mg/dL 409  811  914   BUN 8 - 23 mg/dL 9  9  11    Creatinine 0.61 - 1.24 mg/dL 7.82  9.56  2.13   Sodium 135 - 145 mmol/L 132  133  132   Potassium 3.5 - 5.1 mmol/L 3.7  3.6  3.2   Chloride 98 - 111 mmol/L 98  98  98   CO2 22 - 32 mmol/L 17  19  18    Calcium 8.9 - 10.3 mg/dL 9.1  9.1  8.9   Total Protein 6.5 - 8.1 g/dL 7.2  7.2  7.2   Total Bilirubin 0.3 - 1.2 mg/dL 1.4  1.3  1.6   Alkaline Phos 38 - 126 U/L 120  120  118   AST 15 - 41 U/L 36  35  31   ALT 0 - 44 U/L 39  38  35      No results found for: "CEA1", "CEA" / No results found for: "CEA1", "CEA" Lab Results  Component Value Date   PSA1 1.0 05/04/2022   No results found for: "YQM578" No results found for: "CAN125"  No results found for: "TOTALPROTELP", "ALBUMINELP", "A1GS", "A2GS", "BETS", "BETA2SER", "GAMS", "MSPIKE", "SPEI" Lab Results  Component Value Date   TIBC 436 08/17/2022   FERRITIN 35 08/17/2022   IRONPCTSAT 15  08/17/2022   No results found for: "LDH"   STUDIES:   No results found.

## 2023-04-18 ENCOUNTER — Inpatient Hospital Stay (HOSPITAL_BASED_OUTPATIENT_CLINIC_OR_DEPARTMENT_OTHER): Payer: Medicare Other | Admitting: Hematology

## 2023-04-18 ENCOUNTER — Other Ambulatory Visit: Payer: Self-pay

## 2023-04-18 ENCOUNTER — Inpatient Hospital Stay: Payer: Medicare Other | Attending: Hematology

## 2023-04-18 ENCOUNTER — Emergency Department (HOSPITAL_COMMUNITY)
Admission: EM | Admit: 2023-04-18 | Discharge: 2023-04-18 | Disposition: A | Discharge status: Home / Self Care | Payer: Medicare Other | Attending: Emergency Medicine | Admitting: Emergency Medicine

## 2023-04-18 ENCOUNTER — Encounter (HOSPITAL_COMMUNITY): Payer: Self-pay

## 2023-04-18 ENCOUNTER — Other Ambulatory Visit: Payer: Self-pay | Admitting: *Deleted

## 2023-04-18 ENCOUNTER — Inpatient Hospital Stay: Payer: Medicare Other

## 2023-04-18 DIAGNOSIS — Z792 Long term (current) use of antibiotics: Secondary | ICD-10-CM | POA: Insufficient documentation

## 2023-04-18 DIAGNOSIS — C778 Secondary and unspecified malignant neoplasm of lymph nodes of multiple regions: Secondary | ICD-10-CM | POA: Insufficient documentation

## 2023-04-18 DIAGNOSIS — Z9221 Personal history of antineoplastic chemotherapy: Secondary | ICD-10-CM | POA: Diagnosis not present

## 2023-04-18 DIAGNOSIS — M549 Dorsalgia, unspecified: Secondary | ICD-10-CM | POA: Insufficient documentation

## 2023-04-18 DIAGNOSIS — Z809 Family history of malignant neoplasm, unspecified: Secondary | ICD-10-CM | POA: Insufficient documentation

## 2023-04-18 DIAGNOSIS — C61 Malignant neoplasm of prostate: Secondary | ICD-10-CM | POA: Insufficient documentation

## 2023-04-18 DIAGNOSIS — Z79899 Other long term (current) drug therapy: Secondary | ICD-10-CM | POA: Diagnosis not present

## 2023-04-18 DIAGNOSIS — E785 Hyperlipidemia, unspecified: Secondary | ICD-10-CM | POA: Insufficient documentation

## 2023-04-18 DIAGNOSIS — Z87891 Personal history of nicotine dependence: Secondary | ICD-10-CM | POA: Insufficient documentation

## 2023-04-18 DIAGNOSIS — N3001 Acute cystitis with hematuria: Secondary | ICD-10-CM | POA: Insufficient documentation

## 2023-04-18 DIAGNOSIS — R319 Hematuria, unspecified: Secondary | ICD-10-CM | POA: Insufficient documentation

## 2023-04-18 DIAGNOSIS — Z8249 Family history of ischemic heart disease and other diseases of the circulatory system: Secondary | ICD-10-CM | POA: Insufficient documentation

## 2023-04-18 DIAGNOSIS — R112 Nausea with vomiting, unspecified: Secondary | ICD-10-CM | POA: Insufficient documentation

## 2023-04-18 DIAGNOSIS — C7951 Secondary malignant neoplasm of bone: Secondary | ICD-10-CM | POA: Insufficient documentation

## 2023-04-18 DIAGNOSIS — I10 Essential (primary) hypertension: Secondary | ICD-10-CM | POA: Insufficient documentation

## 2023-04-18 DIAGNOSIS — Z95828 Presence of other vascular implants and grafts: Secondary | ICD-10-CM | POA: Diagnosis not present

## 2023-04-18 DIAGNOSIS — M545 Low back pain, unspecified: Secondary | ICD-10-CM | POA: Diagnosis present

## 2023-04-18 DIAGNOSIS — R0602 Shortness of breath: Secondary | ICD-10-CM | POA: Insufficient documentation

## 2023-04-18 DIAGNOSIS — Z803 Family history of malignant neoplasm of breast: Secondary | ICD-10-CM | POA: Insufficient documentation

## 2023-04-18 DIAGNOSIS — R7989 Other specified abnormal findings of blood chemistry: Secondary | ICD-10-CM | POA: Insufficient documentation

## 2023-04-18 DIAGNOSIS — R634 Abnormal weight loss: Secondary | ICD-10-CM | POA: Insufficient documentation

## 2023-04-18 DIAGNOSIS — K5900 Constipation, unspecified: Secondary | ICD-10-CM | POA: Insufficient documentation

## 2023-04-18 DIAGNOSIS — Z886 Allergy status to analgesic agent status: Secondary | ICD-10-CM | POA: Insufficient documentation

## 2023-04-18 LAB — URINALYSIS, ROUTINE W REFLEX MICROSCOPIC
Bilirubin Urine: NEGATIVE
Glucose, UA: NEGATIVE mg/dL
Ketones, ur: 20 mg/dL — AB
Nitrite: NEGATIVE
Protein, ur: 100 mg/dL — AB
RBC / HPF: >50 RBC/hpf (ref 0–5)
Specific Gravity, Urine: 1.014 (ref 1.005–1.030)
WBC, UA: >50 WBC/hpf (ref 0–5)
pH: 6 (ref 5.0–8.0)

## 2023-04-18 LAB — CBC WITH DIFFERENTIAL/PLATELET
Abs Immature Granulocytes: 0.03 10*3/uL (ref 0.00–0.07)
Abs Immature Granulocytes: 0.02 10*3/uL (ref 0.00–0.07)
Basophils Absolute: 0.1 10*3/uL (ref 0.0–0.1)
Basophils Absolute: 0 10*3/uL (ref 0.0–0.1)
Basophils Relative: 1 %
Basophils Relative: 0 %
Eosinophils Absolute: 0 10*3/uL (ref 0.0–0.5)
Eosinophils Absolute: 0 10*3/uL (ref 0.0–0.5)
Eosinophils Relative: 1 %
Eosinophils Relative: 0 %
HCT: 34 % — ABNORMAL LOW (ref 39.0–52.0)
HCT: 34.8 % — ABNORMAL LOW (ref 39.0–52.0)
Hemoglobin: 11.3 g/dL — ABNORMAL LOW (ref 13.0–17.0)
Hemoglobin: 11.2 g/dL — ABNORMAL LOW (ref 13.0–17.0)
Immature Granulocytes: 0 %
Immature Granulocytes: 0 %
Lymphocytes Relative: 9 %
Lymphocytes Relative: 7 %
Lymphs Abs: 0.6 10*3/uL — ABNORMAL LOW (ref 0.7–4.0)
Lymphs Abs: 0.5 10*3/uL — ABNORMAL LOW (ref 0.7–4.0)
MCH: 27.5 pg (ref 26.0–34.0)
MCH: 26.7 pg (ref 26.0–34.0)
MCHC: 33.2 g/dL (ref 30.0–36.0)
MCHC: 32.2 g/dL (ref 30.0–36.0)
MCV: 82.7 fL (ref 80.0–100.0)
MCV: 83.1 fL (ref 80.0–100.0)
Monocytes Absolute: 0.8 10*3/uL (ref 0.1–1.0)
Monocytes Absolute: 0.7 10*3/uL (ref 0.1–1.0)
Monocytes Relative: 12 %
Monocytes Relative: 9 %
Neutro Abs: 5.5 10*3/uL (ref 1.7–7.7)
Neutro Abs: 6.3 10*3/uL (ref 1.7–7.7)
Neutrophils Relative %: 77 %
Neutrophils Relative %: 84 %
Platelets: 451 10*3/uL — ABNORMAL HIGH (ref 150–400)
Platelets: 395 10*3/uL (ref 150–400)
RBC: 4.11 MIL/uL — ABNORMAL LOW (ref 4.22–5.81)
RBC: 4.19 MIL/uL — ABNORMAL LOW (ref 4.22–5.81)
RDW: 16.3 % — ABNORMAL HIGH (ref 11.5–15.5)
RDW: 16.3 % — ABNORMAL HIGH (ref 11.5–15.5)
WBC: 7.1 10*3/uL (ref 4.0–10.5)
WBC: 7.6 10*3/uL (ref 4.0–10.5)
nRBC: 0 % (ref 0.0–0.2)
nRBC: 0 % (ref 0.0–0.2)

## 2023-04-18 LAB — COMPREHENSIVE METABOLIC PANEL
ALT: 38 U/L (ref 0–44)
ALT: 39 U/L (ref 0–44)
AST: 35 U/L (ref 15–41)
AST: 36 U/L (ref 15–41)
Albumin: 3.6 g/dL (ref 3.5–5.0)
Albumin: 3.6 g/dL (ref 3.5–5.0)
Alkaline Phosphatase: 120 U/L (ref 38–126)
Alkaline Phosphatase: 120 U/L (ref 38–126)
Anion gap: 16 — ABNORMAL HIGH (ref 5–15)
Anion gap: 17 — ABNORMAL HIGH (ref 5–15)
BUN: 9 mg/dL (ref 8–23)
BUN: 9 mg/dL (ref 8–23)
CO2: 19 mmol/L — ABNORMAL LOW (ref 22–32)
CO2: 17 mmol/L — ABNORMAL LOW (ref 22–32)
Calcium: 9.1 mg/dL (ref 8.9–10.3)
Calcium: 9.1 mg/dL (ref 8.9–10.3)
Chloride: 98 mmol/L (ref 98–111)
Chloride: 98 mmol/L (ref 98–111)
Creatinine, Ser: 1.51 mg/dL — ABNORMAL HIGH (ref 0.61–1.24)
Creatinine, Ser: 1.69 mg/dL — ABNORMAL HIGH (ref 0.61–1.24)
GFR, Estimated: 49 mL/min — ABNORMAL LOW (ref >60)
GFR, Estimated: 43 mL/min — ABNORMAL LOW (ref >60)
Glucose, Bld: 130 mg/dL — ABNORMAL HIGH (ref 70–99)
Glucose, Bld: 157 mg/dL — ABNORMAL HIGH (ref 70–99)
Potassium: 3.6 mmol/L (ref 3.5–5.1)
Potassium: 3.7 mmol/L (ref 3.5–5.1)
Sodium: 133 mmol/L — ABNORMAL LOW (ref 135–145)
Sodium: 132 mmol/L — ABNORMAL LOW (ref 135–145)
Total Bilirubin: 1.3 mg/dL — ABNORMAL HIGH (ref 0.3–1.2)
Total Bilirubin: 1.4 mg/dL — ABNORMAL HIGH (ref 0.3–1.2)
Total Protein: 7.2 g/dL (ref 6.5–8.1)
Total Protein: 7.2 g/dL (ref 6.5–8.1)

## 2023-04-18 LAB — PSA: Prostatic Specific Antigen: 4.59 ng/mL — ABNORMAL HIGH (ref 0.00–4.00)

## 2023-04-18 LAB — LACTIC ACID, PLASMA
Lactic Acid, Venous: 1.7 mmol/L (ref 0.5–1.9)
Lactic Acid, Venous: 1 mmol/L (ref 0.5–1.9)

## 2023-04-18 LAB — MAGNESIUM: Magnesium: 1.7 mg/dL (ref 1.7–2.4)

## 2023-04-18 MED ORDER — SODIUM CHLORIDE 0.9% FLUSH
10.0000 mL | Freq: Once | INTRAVENOUS | Status: AC
  Administered 2023-04-18: 10 mL via INTRAVENOUS

## 2023-04-18 MED ORDER — SODIUM CHLORIDE 0.9 % IV SOLN: INTRAVENOUS | Status: DC

## 2023-04-18 MED ORDER — OXYCODONE HCL ER 20 MG PO T12A: 20.0000 mg | EXTENDED_RELEASE_TABLET | Freq: Two times a day (BID) | ORAL | 0 refills | Status: DC

## 2023-04-18 MED ORDER — OXYCODONE HCL 5 MG PO TABS: 10.0000 mg | ORAL_TABLET | Freq: Four times a day (QID) | ORAL | 0 refills | Status: DC | PRN

## 2023-04-18 MED ORDER — CIPROFLOXACIN HCL 500 MG PO TABS: 500.0000 mg | ORAL_TABLET | Freq: Two times a day (BID) | ORAL | 0 refills | Status: DC

## 2023-04-18 MED ORDER — HYDROMORPHONE HCL 1 MG/ML IJ SOLN: 2.0000 mg | Freq: Once | INTRAMUSCULAR | Status: DC

## 2023-04-18 NOTE — Progress Notes (Signed)
Manual BP checked, 68/40. MD aware. Pt to be evaluated in ED per Dr. Ellin Saba. Report called to ED Charge RN, Inetta Fermo. Transported via wheelchair to ED Rm 9.

## 2023-04-18 NOTE — ED Triage Notes (Signed)
Pt comes in from cancer center with lower left back pain. Per cancer center pt was to receive chemo tx and BP was 60/40s.

## 2023-04-18 NOTE — Progress Notes (Signed)
Called to patient's room by his son, who states he witnessed what appeared to be a seizure.  Upon entering room, patient was slumped over in wheelchair to right side.  Patient in severe pain in back and coccyx area.  Able to answer questions.  No facial droop noted and able to show teeth.  Grips are equal but weak.  Unable to hold arms out.  Intermittently tilts head back, with limp extremities.  Skin cool and clammy.  BP assessed manually and was 68/40.  Dr. Ellin Saba came to bedside to assess patient.  Will transport to ER 9 by Deneise Lever and Kennith Gain for further evaluation.

## 2023-04-18 NOTE — Discharge Instructions (Signed)
Your testing has shown a urinary tract infection.  You will need to take the antibiotic that I prescribed twice a day for the next 7 days to treat this.  Return to the ER for severe or worsening pain, vomiting or fever.  Otherwise follow-up with your cancer center for your ongoing infusions

## 2023-04-18 NOTE — ED Provider Notes (Signed)
[place] EMERGENCY DEPARTMENT AT [facility] Provider Note   CSN: [id number] Arrival date & time: 04/18/23  1104     History  Chief Complaint  Patient presents with   Back Pain    Tony Powell is a 72 y.o. male.   Back Pain  72 year old male, history of metastatic prostate cancer status post urinary stents presented for his third round of chemotherapy at the cancer center this morning.  He had a car ride from [place] down here and was already uncomfortable when he got here according to the son who is his primary caregiver and who lives with him.  Evidently as he was getting set up to get the chemotherapy the patient became agitated, sweaty, noted to have blood pressure in the 60s and without intervention was sent to the emergency department for evaluation.  I did discuss the case with Dr. Ellin Saba who gave me the details of his presentation this morning.  The patient denies to me that he is having shortness of breath, he states that he has left lower back pain which is somewhat chronic and seems to get worse when he sits for prolonged periods of time.  He prefers to be laying down but was not able to be in that position this morning    Home Medications Prior to Admission medications   Medication Sig Start Date End Date Taking? Authorizing Provider  ciprofloxacin (CIPRO) 500 MG tablet Take 1 tablet (500 mg total) by mouth 2 (two) times daily. 04/18/23  Yes Eber Hong, MD  alfuzosin (UROXATRAL) 10 MG 24 hr tablet Take 10 mg by mouth daily with breakfast.    [provider]  cephALEXin (KEFLEX) 500 MG capsule Take 500 mg by mouth 2 (two) times daily as needed (burning during urination).    [provider]  cyclobenzaprine (FLEXERIL) 5 MG tablet Take 1 tablet (5 mg total) by mouth 3 (three) times daily as needed for muscle spasms. 02/07/23   McKenzie, Mardene Celeste, MD  darolutamide (NUBEQA) 300 MG tablet Take 2 tablets (600 mg total) by mouth 2 (two) times  daily with a meal. 02/15/23   Doreatha Massed, MD  DOCETAXEL IV Inject into the vein every 21 ( twenty-one) days. 02/26/23   [provider]  finasteride (PROSCAR) 5 MG tablet Take 1 tablet (5 mg total) by mouth daily. 05/12/22   McKenzie, Mardene Celeste, MD  Iron, Ferrous Sulfate, 325 (65 Fe) MG TABS Take 325 mg by mouth every other day. 08/20/22   Tommie Sams, DO  lidocaine-prilocaine (EMLA) cream Apply a quarter sized amount to port a cath site and cover with plastic wrap one hour prior to infusion appointments 02/22/23   Doreatha Massed, MD  megestrol (MEGACE) 400 MG/10ML suspension Take 10 mLs (400 mg total) by mouth 2 (two) times daily. 03/28/23   Doreatha Massed, MD  Multiple Vitamin (MULTIVITAMIN) capsule Take 1 capsule by mouth daily.    [provider]  naproxen sodium (ALEVE) 220 MG tablet Take 220 mg by mouth once.    [provider]  ondansetron (ZOFRAN-ODT) 8 MG disintegrating tablet Take 1 tablet (8 mg total) by mouth every 8 (eight) hours as needed for nausea or vomiting. 03/15/23   Briant Cedar, PA-C  oxybutynin (DITROPAN XL) 15 MG 24 hr tablet TAKE ONE (1) TABLET (15 MG TOTAL) BY MOUTH DAILY. Patient taking differently: Take 15 mg by mouth at bedtime. 12/05/22   McKenzie, Mardene Celeste, MD  oxyCODONE (ROXICODONE) 5 MG immediate  release tablet Take 2 tablets (10 mg total) by mouth every 6 (six) hours as needed for severe pain. 03/28/23   Doreatha Massed, MD  polyethylene glycol-electrolytes (NULYTELY) 420 g solution Take by mouth. 03/01/23   [provider]  prochlorperazine (COMPAZINE) 10 MG tablet TAKE ONE (1) TABLET (10 MG TOTAL) BY MOUTH EVERY SIX (6) (SIX) HOURS AS NEEDED FOR NAUSEA OR VOMITING. 04/03/23   Doreatha Massed, MD  rosuvastatin (CRESTOR) 5 MG tablet Take 1 tablet (5 mg total) by mouth daily. 02/20/23   Tommie Sams, DO  sildenafil (VIAGRA) 100 MG tablet Take 1 tablet (100 mg total) by mouth daily as needed for erectile  dysfunction. 08/25/22   Tommie Sams, DO  tamsulosin (FLOMAX) 0.4 MG CAPS capsule Take 1 capsule (0.4 mg total) by mouth daily. Patient not taking: Reported on 04/18/2023 10/27/22   Malen Gauze, MD  valsartan (DIOVAN) 160 MG tablet Take 1 tablet (160 mg total) by mouth daily. 08/17/22   Tommie Sams, DO      Allergies    Celebrex [celecoxib]    Review of Systems   Review of Systems  Musculoskeletal:  Positive for back pain.  All other systems reviewed and are negative.   Physical Exam Updated Vital Signs BP (!) 141/82 (BP Location: Right Arm)   Pulse 93   Temp (!) 97.5 F (36.4 C) (Oral)   Resp 20   Ht 1.778 m (5\' 10" )   Wt 89.8 kg   SpO2 100%   BMI 28.41 kg/m  Physical Exam Vitals and nursing note reviewed.  Constitutional:      General: He is not in acute distress.    Appearance: He is well-developed.  HENT:     Head: Normocephalic and atraumatic.     Mouth/Throat:     Pharynx: No oropharyngeal exudate.  Eyes:     General: No scleral icterus.       Right eye: No discharge.        Left eye: No discharge.     Conjunctiva/sclera: Conjunctivae normal.     Pupils: Pupils are equal, round, and reactive to light.  Neck:     Thyroid: No thyromegaly.     Vascular: No JVD.  Cardiovascular:     Rate and Rhythm: Normal rate and regular rhythm.     Heart sounds: Normal heart sounds. No murmur heard.    No friction rub. No gallop.  Pulmonary:     Effort: Pulmonary effort is normal. No respiratory distress.     Breath sounds: Normal breath sounds. No wheezing or rales.  Abdominal:     General: Bowel sounds are normal. There is no distension.     Palpations: Abdomen is soft. There is no mass.     Tenderness: There is no abdominal tenderness.  Musculoskeletal:        General: No tenderness. Normal range of motion.     Cervical back: Normal range of motion and neck supple.     Right lower leg: No edema.     Left lower leg: No edema.  Lymphadenopathy:     Cervical:  No cervical adenopathy.  Skin:    General: Skin is warm and dry.     Findings: No erythema or rash (.milm).  Neurological:     Mental Status: He is alert.     Coordination: Coordination normal.  Psychiatric:        Behavior: Behavior normal.     ED Results / Procedures / Treatments  Labs (all labs ordered are listed, but only abnormal results are displayed) Labs Reviewed  CBC WITH DIFFERENTIAL/PLATELET - Abnormal; Notable for the following components:      Result Value   RBC 4.19 (*)    Hemoglobin 11.2 (*)    HCT 34.8 (*)    RDW 16.3 (*)    Lymphs Abs 0.5 (*)    All other components within normal limits  COMPREHENSIVE METABOLIC PANEL - Abnormal; Notable for the following components:   Sodium 132 (*)    CO2 17 (*)    Glucose, Bld 157 (*)    Creatinine, Ser 1.69 (*)    Total Bilirubin 1.4 (*)    GFR, Estimated 43 (*)    Anion gap 17 (*)    All other components within normal limits  URINALYSIS, ROUTINE W REFLEX MICROSCOPIC - Abnormal; Notable for the following components:   Color, Urine AMBER (*)    APPearance TURBID (*)    Hgb urine dipstick MODERATE (*)    Ketones, ur 20 (*)    Protein, ur 100 (*)    Leukocytes,Ua MODERATE (*)    Bacteria, UA RARE (*)    All other components within normal limits  URINE CULTURE  LACTIC ACID, PLASMA  LACTIC ACID, PLASMA    EKG EKG Interpretation  Date/Time:  Wednesday April 18 2023 11:49:55 EDT Ventricular Rate:  92 PR Interval:  170 QRS Duration: 145 QT Interval:  410 QTC Calculation: 508 R Axis:   75 Text Interpretation: Sinus rhythm Left bundle branch block since last tracing no significant change Confirmed by Eber Hong ([id number]) on 04/18/2023 11:51:49 AM  Radiology No results found.  Procedures Procedures    Medications Ordered in ED Medications  0.9 %  sodium chloride infusion ( Intravenous New Bag/Given 04/18/23 1140)    ED Course/ Medical Decision Making/ A&P                             Medical Decision  Making Amount and/or Complexity of Data Reviewed Labs: ordered. ECG/medicine tests: ordered.  Risk Prescription drug management.    This patient presents to the ED for concern of hypotension, this involves an extensive number of treatment options, and is a complaint that carries with it a high risk of complications and morbidity.  The differential diagnosis includes sepsis, dehydration, could be related to anxiety, could have had a vagal episode secondary to pain   Co morbidities that complicate the patient evaluation  Metastatic prostate cancer   Additional history obtained:  Additional history obtained from discussion with the oncologist as well as the medical record External records from outside source obtained and reviewed including prior cancer center visits for chemotherapy, recent admission to the hospital approximately 2 months ago because of a left kidney stone requiring cystoscopy and stent placement   Lab Tests:  I Ordered, and personally interpreted labs.  The pertinent results include: CBC and metabolic panel without any specific findings of abnormalities.  There is slight renal insufficiency but no significant change from baseline.  Urinalysis does reveal greater than 50 white blood cells and red blood cells and the presence of bacteria.    Cardiac Monitoring: / EKG:  The patient was maintained on a cardiac monitor.  I personally viewed and interpreted the cardiac monitored which showed an underlying rhythm of: Sinus rhythm, no tachycardia   Consultations Obtained:  I requested consultation with the oncologist,  and discussed lab and imaging findings as  well as pertinent plan - they recommend: Evaluation for possible infection and resuscitation as needed   Problem List / ED Course / Critical interventions / Medication management  At no point in the emergency department was the patient hypotensive or tachycardic in fact he is well-appearing without complaint at  this time. I ordered medication including ciprofloxacin for UTI given his recent treatment with cephalexin.  Also the patient is on an angiotensin receptor blocker so I will avoid Bactrim for fear of renal failure or hyperkalemia Reevaluation of the patient after these medicines showed that the patient improved I have reviewed the patients home medicines and have made adjustments as needed   Social Determinants of Health:  Advanced metastatic cancer   Test / Admission - Considered:  Consider mission but the patient is well-appearing         Final Clinical Impression(s) / ED Diagnoses Final diagnoses:  Acute cystitis with hematuria    Rx / DC Orders ED Discharge Orders          Ordered    ciprofloxacin (CIPRO) 500 MG tablet  2 times daily        04/18/23 1507              Eber Hong, MD 04/18/23 1510

## 2023-04-18 NOTE — Progress Notes (Signed)
Patient did not receive treatment today due to being hypotensive. Send to ED per Dr Ellin Saba.

## 2023-04-19 ENCOUNTER — Telehealth: Payer: Self-pay

## 2023-04-19 LAB — URINE CULTURE

## 2023-04-19 NOTE — Transitions of Care (Post Inpatient/ED Visit) (Signed)
04/19/2023  Name: Tony Powell MRN: [id number] DOB: 21-May-1951  Today's TOC FU Call Status: Today's TOC FU Call Status:: Successful TOC FU Call Competed TOC FU Call Complete Date: 04/19/23  Transition Care Management Follow-up Telephone Call Date of Discharge: 04/18/23 Dis[facility] Type of Discharge: Emergency Department Reason for ED Visit: Other: (cystitis) How have you been since you were released from the hospital?: Better Any questions or concerns?: No  Items Reviewed: Did you receive and understand the discharge instructions provided?: Yes Medications obtained,verified, and reconciled?: Yes (Medications Reviewed) Any new allergies since your discharge?: No Dietary orders reviewed?: Yes Do you have support at home?: Yes People in Home: child(ren), adult  Medications Reviewed Today: Medications Reviewed Today     Reviewed by Karena Addison, LPN (Licensed Practical Nurse) on 04/19/23 at 1302  Med List Status: <None>   Medication Order Taking? Sig Documenting Provider Last Dose Status Informant  alfuzosin (UROXATRAL) 10 MG 24 hr tablet [id number] No Take 10 mg by mouth daily with breakfast. [provider] Taking Active Self  cephALEXin (KEFLEX) 500 MG capsule [id number] No Take 500 mg by mouth 2 (two) times daily as needed (burning during urination). [provider] Taking Active Self  ciprofloxacin (CIPRO) 500 MG tablet [id number]  Take 1 tablet (500 mg total) by mouth 2 (two) times daily. Eber Hong, MD  Active   cyclobenzaprine (FLEXERIL) 5 MG tablet [id number] No Take 1 tablet (5 mg total) by mouth 3 (three) times daily as needed for muscle spasms. McKenzie, Mardene Celeste, MD Taking Active   darolutamide [facility]) 300 MG tablet [id number] No Take 2 tablets (600 mg total) by mouth 2 (two) times daily with a meal. Doreatha Massed, MD Taking Active   DOCETAXEL IV [id number] No Inject into the vein every 21 ( twenty-one) days. [provider] Taking Active   finasteride (PROSCAR) 5 MG tablet [id number] No Take 1 tablet (5 mg total) by mouth daily. McKenzie, Mardene Celeste, MD Taking Active Self  Iron, Ferrous Sulfate, 325 (65 Fe) MG TABS [id number] No Take 325 mg by mouth every other day. Tommie Sams, DO Taking Active Self  lidocaine-prilocaine (EMLA) cream [id number] No Apply a quarter sized amount to port a cath site and cover with plastic wrap one hour prior to infusion appointments Doreatha Massed, MD Taking Active   megestrol (MEGACE) 400 MG/10ML suspension [id number] No Take 10 mLs (400 mg total) by mouth 2 (two) times daily. Doreatha Massed, MD Taking Active   Multiple Vitamin (MULTIVITAMIN) capsule [id number] No Take 1 capsule by mouth daily. [provider] Taking Active Self  naproxen sodium (ALEVE) 220 MG tablet [id number] No Take 220 mg by mouth once. [provider] Taking Active Self  ondansetron (ZOFRAN-ODT) 8 MG disintegrating tablet [id number] No Take 1 tablet (8 mg total) by mouth every 8 (eight) hours as needed for nausea or vomiting. Briant Cedar, PA-C Taking Active   oxybutynin (DITROPAN XL) 15 MG 24 hr tablet [id number] No TAKE ONE (1) TABLET (15 MG TOTAL) BY MOUTH DAILY.  Patient taking differently: Take 15 mg by mouth at bedtime.   McKenzie, Mardene Celeste, MD Taking Active Self  oxyCODONE (OXYCONTIN) 20 mg 12 hr tablet [id number]  Take 1 tablet (20 mg total) by mouth every 12 (twelve) hours. Doreatha Massed, MD  Active   oxyCODONE (ROXICODONE) 5 MG immediate release tablet [id number]  Take 2 tablets (10 mg total) by mouth every 6 (six) hours as needed for severe pain. Ellin Saba,  Vern Claude, MD  Active   polyethylene glycol-electrolytes (NULYTELY) 420 g solution [id number] No Take by mouth. [provider] Taking Active   prochlorperazine (COMPAZINE) 10 MG tablet [id number] No TAKE ONE (1) TABLET (10 MG TOTAL) BY MOUTH EVERY SIX (6) (SIX) HOURS AS NEEDED FOR NAUSEA OR VOMITING.  Doreatha Massed, MD Taking Active   rosuvastatin (CRESTOR) 5 MG tablet [id number] No Take 1 tablet (5 mg total) by mouth daily. Tommie Sams, DO Taking Active   sildenafil (VIAGRA) 100 MG tablet [id number] No Take 1 tablet (100 mg total) by mouth daily as needed for erectile dysfunction. Tommie Sams, DO Taking Active Self  tamsulosin (FLOMAX) 0.4 MG CAPS capsule [id number] No Take 1 capsule (0.4 mg total) by mouth daily.  Patient not taking: Reported on 04/18/2023   Malen Gauze, MD Completed Course Active Self  valsartan (DIOVAN) 160 MG tablet [id number] No Take 1 tablet (160 mg total) by mouth daily. Tommie Sams, DO Taking Active Self            Home Care and Equipment/Supplies: Were Home Health Services Ordered?: NA Any new equipment or medical supplies ordered?: NA  Functional Questionnaire: Do you need assistance with bathing/showering or dressing?: No Do you need assistance with meal preparation?: No Do you need assistance with eating?: No Do you have difficulty maintaining continence: No Do you need assistance with getting out of bed/getting out of a chair/moving?: No Do you have difficulty managing or taking your medications?: No  Follow up appointments reviewed: PCP Follow-up appointment confirmed?: NA Specialist Hospital Follow-up appointment confirmed?: No Date of Specialist follow-up appointment?: 05/01/23 Follow-Up Specialty Provider:: onco Do you need transportation to your follow-up appointment?: No Do you understand care options if your condition(s) worsen?: Yes-patient verbalized understanding    SIGNATURE Karena Addison, LPN [facility] Nurse Health Advisor Direct Dial [phone]

## 2023-04-20 ENCOUNTER — Inpatient Hospital Stay: Payer: Medicare Other

## 2023-04-20 LAB — URINE CULTURE: Culture: 20000 — AB

## 2023-04-21 ENCOUNTER — Telehealth (HOSPITAL_BASED_OUTPATIENT_CLINIC_OR_DEPARTMENT_OTHER): Payer: Self-pay | Admitting: *Deleted

## 2023-04-21 NOTE — Telephone Encounter (Signed)
Post ED Visit - Positive Culture Follow-up  Culture report reviewed by antimicrobial stewardship pharmacist: [facility] []  Enzo Bi, Pharm.D. []  Celedonio Miyamoto, Pharm.D., BCPS AQ-ID []  Garvin Fila, Pharm.D., BCPS []  Georgina Pillion, [address].D., BCPS []  [place], [address].D., BCPS, AAHIVP []  Estella Husk, Pharm.D., BCPS, AAHIVP []  Lysle Pearl, PharmD, BCPS []  Phillips Climes, PharmD, BCPS []  Agapito Games, PharmD, BCPS []  Verlan Friends, PharmD []  Mervyn Gay, PharmD, BCPS [x]  Estill Batten, PharmD  [facility] []  Len Childs, PharmD []  Greer Pickerel, PharmD []  Adalberto Cole, PharmD []  Perlie Gold, Rph []  Lonell Face) Jean Rosenthal, PharmD []  Earl Many, PharmD []  Junita Push, PharmD []  Dorna Leitz, PharmD []  Terrilee Files, PharmD []  Lynann Beaver, PharmD []  Keturah Barre, PharmD []  Loralee Pacas, PharmD []  Bernadene Person, PharmD   Positive Urine culture Treated with Ciprofloxacin, organism sensitive to the same and no further patient follow-up is required at this time.  Patsey Berthold 04/21/2023, 12:58 PM

## 2023-04-25 ENCOUNTER — Ambulatory Visit: Payer: Medicare Other

## 2023-04-25 DIAGNOSIS — C61 Malignant neoplasm of prostate: Secondary | ICD-10-CM

## 2023-04-26 ENCOUNTER — Inpatient Hospital Stay: Payer: Medicare Other | Admitting: Licensed Clinical Social Worker

## 2023-04-26 DIAGNOSIS — Z803 Family history of malignant neoplasm of breast: Secondary | ICD-10-CM

## 2023-04-26 DIAGNOSIS — Z8042 Family history of malignant neoplasm of prostate: Secondary | ICD-10-CM

## 2023-04-26 DIAGNOSIS — Z1379 Encounter for other screening for genetic and chromosomal anomalies: Secondary | ICD-10-CM

## 2023-04-26 DIAGNOSIS — Z800 Family history of malignant neoplasm of digestive organs: Secondary | ICD-10-CM

## 2023-04-26 DIAGNOSIS — C61 Malignant neoplasm of prostate: Secondary | ICD-10-CM

## 2023-04-26 NOTE — Progress Notes (Signed)
REFERRING PROVIDER: Doreatha Massed, MD [address] [place],  [place] [address]  PRIMARY PROVIDER:  Tommie Sams, DO  PRIMARY REASON FOR VISIT:  1. Genetic testing   2. Prostate cancer (HCC)   3. Family history of prostate cancer   4. Family history of pancreatic cancer   5. Family history of breast cancer      HISTORY OF PRESENT ILLNESS:   [name], a 72 y.o. male, was seen for a [facility] cancer genetics consultation at the request of Dr. Girtha Rm due to a personal and family history of cancer.  Mr. Mabey presents to clinic today to discuss the possibility of a hereditary predisposition to cancer, genetic testing, and to further clarify his future cancer risks, as well as potential cancer risks for family members.   In 2021, at the age of 72, Mr. Woolum was diagnosed with metastatic prostate cancer. This has been treated with Deborra Medina and chemotherapy.   CANCER HISTORY:  Oncology History  Prostate cancer (HCC)  08/30/2020 Initial Diagnosis   Prostate cancer   03/07/2023 -  Chemotherapy   Patient is on Treatment Plan : PROSTATE Docetaxel (75) q21d      Genetic Testing   No pathogenic variants identified on the [facility] Multi-Cancer+RNA panel. VUS in HOXB13 called c.314C>T and VUS in PMS2 called c.682G>A identified. The report date is 02/24/2023.  The Multi-Cancer + RNA Panel offered by [facility] includes sequencing and/or deletion/duplication analysis of the following 70 genes:  AIP*, ALK, APC*, ATM*, AXIN2*, BAP1*, BARD1*, BLM*, BMPR1A*, BRCA1*, BRCA2*, BRIP1*, CDC73*, CDH1*, CDK4, CDKN1B*, CDKN2A, CHEK2*, CTNNA1*, DICER1*, EPCAM, EGFR, FH*, FLCN*, GREM1, HOXB13, KIT, LZTR1, MAX*, MBD4, MEN1*, MET, MITF, MLH1*, MSH2*, MSH3*, MSH6*, MUTYH*, NF1*, NF2*, NTHL1*, PALB2*, PDGFRA, PMS2*, POLD1*, POLE*, POT1*, PRKAR1A*, PTCH1*, PTEN*, RAD51C*, RAD51D*, RB1*, RET, SDHA*, SDHAF2*, SDHB*, SDHC*, SDHD*, SMAD4*, SMARCA4*, SMARCB1*, SMARCE1*, STK11*, SUFU*, TMEM127*, TP53*, TSC1*, TSC2*, VHL*.  RNA analysis is performed for * genes.    Past Medical History:  Diagnosis Date   Abnormal stress test    Borderline hypertension    Chest discomfort    Atypical   DJD (degenerative joint disease)    Knees, lumbar sacral spine   Hyperlipidemia    Hypertension    Port-A-Cath in place 02/22/2023   Prostate cancer [facility])     Past Surgical History:  Procedure Laterality Date   BACK SURGERY     CYSTOSCOPY WITH FULGERATION  04/18/2021   Procedure: CYSTOSCOPY WITH FULGERATION of BLADDER NECK;  Surgeon: Malen Gauze, MD;  Location: [facility];  Service: Urology;;   CYSTOSCOPY WITH INJECTION N/A 04/18/2021   Procedure: CYSTOSCOPY WITH INJECTION- Botox 100 units;  Surgeon: Malen Gauze, MD;  Location: [facility];  Service: Urology;  Laterality: N/A;   CYSTOSCOPY WITH RETROGRADE PYELOGRAM, URETEROSCOPY AND STENT PLACEMENT Left 02/01/2023   Procedure: CYSTOSCOPY WITH RETROGRADE PYELOGRAM, URETEROSCOPY AND STENT PLACEMENT;  Surgeon: Malen Gauze, MD;  Location: [facility];  Service: Urology;  Laterality: Left;   IR FLUORO GUIDED NEEDLE PLC ASPIRATION/INJECTION LOC  02/23/2023   IR IMAGING GUIDED PORT INSERTION  02/19/2023   STONE EXTRACTION WITH BASKET Left 02/01/2023   Procedure: STONE EXTRACTION WITH BASKET;  Surgeon: Malen Gauze, MD;  Location: [facility];  Service: Urology;  Laterality: Left;   TRANSURETHRAL RESECTION OF BLADDER TUMOR N/A 08/25/2020   Procedure: TRANSURETHRAL RESECTION OF BLADDER TUMOR (TURBT);  Surgeon: Malen Gauze, MD;  Location: [facility];  Service: Urology;  Laterality: N/A;    FAMILY HISTORY:  We obtained a detailed, 4-generation family history.  Significant diagnoses are listed below: Family History  Problem Relation Age of Onset   Heart attack Mother    Breast cancer Mother        Mastectomy   Leukemia Father    Cancer Brother    Cancer Brother    Cancer Brother    Cancer Brother    Cancer Brother    Colon cancer Neg Hx    Prostate cancer Neg Hx     Pancreatic cancer Neg Hx    Mr. Ansell has 2 sons, neither have had cancer. He has 7 brothers. One had pancreatic cancer, one had prostate cancer, 2 had cancer, unknown types.  Mr. Lardner mother died of breast cancer in her 75s. Limited information about the rest of this side of the family, possible cancer in maternal aunt.  Mr. Dahle father died of cancer, unknown type. Limited information about the rest of this side of the family.  Mr. Demarest is unaware of previous family history of genetic testing for hereditary cancer risks. There is no reported Ashkenazi Jewish ancestry. There is no known consanguinity.    GENETIC COUNSELING ASSESSMENT: Mr. Wimp is a 72 y.o. male with a personal and family history of cancer which is somewhat suggestive of a hereditary cancer syndrome and predisposition to cancer. We, therefore, discussed and recommended the following at today's visit.   DISCUSSION: We discussed that approximately 10% of prostate cancer is hereditary. Most cases of hereditary prostate cancer are associated with BRCA1/BRCA2 genes, although there are other genes associated with hereditary cancer as well. Cancers and risks are gene specific. We discussed that testing is beneficial for several reasons including knowing about cancer risks, identifying potential screening and risk-reduction options that may be appropriate, and to understand if other family members could be at risk for cancer and allow them to undergo genetic testing.   We reviewed the characteristics, features and inheritance patterns of hereditary cancer syndromes. We also discussed genetic testing, including the appropriate family members to test, the process of testing, insurance coverage and turn-around-time for results. We discussed the implications of a negative, positive and/or variant of uncertain significant result. We recommended Mr. Defrees pursue genetic testing for the [facility] Multi-Cancer+RNA gene panel. This  testing was ordered in April 2024.  GENETIC TEST RESULTS:  The [facility] Multi-Cancer+RNA Panel found no pathogenic mutations.   The Multi-Cancer + RNA Panel offered by [facility] includes sequencing and/or deletion/duplication analysis of the following 70 genes:  AIP*, ALK, APC*, ATM*, AXIN2*, BAP1*, BARD1*, BLM*, BMPR1A*, BRCA1*, BRCA2*, BRIP1*, CDC73*, CDH1*, CDK4, CDKN1B*, CDKN2A, CHEK2*, CTNNA1*, DICER1*, EPCAM, EGFR, FH*, FLCN*, GREM1, HOXB13, KIT, LZTR1, MAX*, MBD4, MEN1*, MET, MITF, MLH1*, MSH2*, MSH3*, MSH6*, MUTYH*, NF1*, NF2*, NTHL1*, PALB2*, PDGFRA, PMS2*, POLD1*, POLE*, POT1*, PRKAR1A*, PTCH1*, PTEN*, RAD51C*, RAD51D*, RB1*, RET, SDHA*, SDHAF2*, SDHB*, SDHC*, SDHD*, SMAD4*, SMARCA4*, SMARCB1*, SMARCE1*, STK11*, SUFU*, TMEM127*, TP53*, TSC1*, TSC2*, VHL*. RNA analysis is performed for * genes.   The test report has been scanned into EPIC and is located under the Molecular Pathology section of the Results Review tab.  A portion of the result report is included below for reference. Genetic testing reported out on 02/24/2023.      Genetic testing identified a variant of uncertain significance (VUS) in the HOXB13 and PMS2 genes.  At this time, it is unknown if this variant is associated with an increased risk for cancer or if it is benign, but most uncertain variants are reclassified to benign. It should  not be used to make medical management decisions. With time, we suspect the laboratory will determine the significance of this variant, if any. If the laboratory reclassifies this variant, we will attempt to contact Mr. Retterer to discuss it further.   Even though a pathogenic variant was not identified, possible explanations for the cancer in the family may include: There may be no hereditary risk for cancer in the family. The cancers in Mr. Noguera and/or his family may be sporadic/familial or due to other genetic and environmental factors. There may be a gene mutation in one of these genes that  current testing methods cannot detect but that chance is small. There could be another gene that has not yet been discovered, or that we have not yet tested, that is responsible for the cancer diagnoses in the family.  It is also possible there is a hereditary cause for the cancer in the family that Mr. Ramaley did not inherit. The variant of uncertain significance detected in the HOXB13/PMS2 gene may be reclassified as a pathogenic variant in the future. At this time, we do not know if this variant increases the risk for cancer.  Therefore, it is important to remain in touch with cancer genetics in the future so that we can continue to offer Mr. Macewan the most up to date genetic testing.   ADDITIONAL GENETIC TESTING:  We discussed with Mr. Mcgovern that his genetic testing was fairly extensive.  If there are additional relevant genes identified to increase cancer risk that can be analyzed in the future, we would be happy to discuss and coordinate this testing at that time.    CANCER SCREENING RECOMMENDATIONS:  Mr. Janice test result is considered negative (normal).  This means that we have not identified a hereditary cause for his personal and family history of cancer at this time.   An individual's cancer risk and medical management are not determined by genetic test results alone. Overall cancer risk assessment incorporates additional factors, including personal medical history, family history, and any available genetic information that may result in a personalized plan for cancer prevention and surveillance. Therefore, it is recommended he continue to follow the cancer management and screening guidelines provided by his oncology and primary healthcare provider.  RECOMMENDATIONS FOR FAMILY MEMBERS:   Since he did not inherit a identifiable mutation in a cancer predisposition gene included on this panel, his children could not have inherited a known mutation from him in one of these  genes. Individuals in this family might be at some increased risk of developing cancer, over the general population risk, due to the family history of cancer.  Individuals in the family should notify their providers of the family history of cancer. We recommend women in this family have a yearly mammogram beginning at age 67, or 71 years younger than the earliest onset of cancer, an annual clinical breast exam, and perform monthly breast self-exams.  Family members should have colonoscopies by at age 72, or earlier, as recommended by their providers. Other members of the family may still carry a pathogenic variant in one of these genes that Mr. Wethington did not inherit. Based on the family history, we recommend his maternal relatives, especially the children of his brother with pancreatic cancer, have genetic counseling and testing. Mr. Rabe will let us know if we can be of any assistance in coordinating genetic counseling and/or testing for this family member.   We do not recommend familial testing for the HOXB13/PMS2 variant of uncertain  significance (VUS).  FOLLOW-UP:  Lastly, we discussed with Mr. Mckinney that cancer genetics is a rapidly advancing field and it is possible that new genetic tests will be appropriate for him and/or his family members in the future. We encouraged him to remain in contact with cancer genetics on an annual basis so we can update his personal and family histories and let him know of advances in cancer genetics that may benefit this family.   Our contact number was provided. Mr. Leavins questions were answered to his satisfaction, and he knows he is welcome to call us at anytime with additional questions or concerns.   Lacy Duverney, MS, [facility] Genetic Counselor [email or web address] Phone: [phone]  The patient was seen for a total of 10 minutes in virtual genetic counseling. Patient was seen with his son, [name]. Dr. Orlie Dakin was available for discussion  regarding this case.   _______________________________________________________________________ For Office Staff:  Number of people involved in session: 2 Was an Intern/ student involved with case: no

## 2023-05-03 ENCOUNTER — Encounter (HOSPITAL_COMMUNITY)
Admission: RE | Admit: 2023-05-03 | Discharge: 2023-05-03 | Disposition: A | Discharge status: Home / Self Care | Payer: Medicare Other | Source: Ambulatory Visit | Attending: Hematology | Admitting: Hematology

## 2023-05-03 DIAGNOSIS — C61 Malignant neoplasm of prostate: Secondary | ICD-10-CM | POA: Diagnosis not present

## 2023-05-03 MED ORDER — PIFLIFOLASTAT F 18 (PYLARIFY) INJECTION
9.0000 | Freq: Once | INTRAVENOUS | Status: AC
  Administered 2023-05-03: 8.31 via INTRAVENOUS

## 2023-05-08 ENCOUNTER — Other Ambulatory Visit: Payer: Self-pay | Admitting: Urology

## 2023-05-08 DIAGNOSIS — N401 Enlarged prostate with lower urinary tract symptoms: Secondary | ICD-10-CM

## 2023-05-08 NOTE — Progress Notes (Signed)
[facility] [address] [place] [address]    Clinic Day:  05/09/2023  Referring physician: Tommie Sams, DO  Patient Care Team: Tommie Sams, DO as PCP - General (Family Medicine) Doreatha Massed, MD as Medical Oncologist (Medical Oncology) Therese Sarah, RN as Oncology Nurse Navigator (Medical Oncology)   ASSESSMENT & PLAN:   Assessment: 1.  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 Jeannette How in [place] Deborra Medina restarted on 12/04/2022, second dose 01/12/2023 - PSMA PET (11/30/2022): Radiotracer avid lesions involving left posteromedial ninth rib, T12 and L2 vertebral bodies.  Retroperitoneal and iliac side chain lymph nodes. - NGS: TMB-low, MS-stable.  BCOR and KMT2C pathogenic variants.  No other targetable mutations. - Docetaxel and darolutamide started on 03/07/2023   2.  Social/family history: -He lives at home with his son [name].  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 - Father had cancer, type not known to the patient.  Mother had breast cancer and died in her 54s.  1 brother had prostate cancer and another brother had pancreatic cancer.  1 other brother had cancer, type unknown to the patient.    Plan: 1.  Metastatic CRPC to the bones and lymph nodes: - He has completed 2 cycles of docetaxel at reduced dose on 03/28/2023. - We reviewed PET scan from 05/03/2023: Good response with decrease in size of lymph nodes whereas bone  metastasis has slightly worsened. - PSA has increased to 4.59 from 3.61. - He is mostly confined to the bed and is unable to do any of his ADLs without help. - I had a prolonged discussion with his son as well as his brother who are present today.  I have recommended stopping treatment as his performance status is poor. - Patient's son and brother and patient are agreeable. - I will make a referral to [facility].   2.  Left back pain radiating to the left groin: -- Continue oxycodone 5 mg 2 tablets every 6 hours as needed. - Continue OxyContin 20 mg every 12 hours.   3. Constipation: - Continue lactulose as needed.   4. Elevated creatinine levels: -- Creatinine has increased to 3.25 from baseline of 1.6.  He will receive 1 L of fluids over 2 hours.   5. Weight loss: -- He is continuing to lose weight.  Continue Megace twice daily.       No orders of the defined types were placed in this encounter.     I,Katie Daubenspeck,acting as a Neurosurgeon for Doreatha Massed, MD.,have documented all relevant documentation on the behalf of Doreatha Massed, MD,as directed by  Doreatha Massed, MD while in the presence of Doreatha Massed, MD.   I, Doreatha Massed MD, have reviewed the above documentation for accuracy and completeness, and I agree with the above.   Doreatha Massed, MD   6/26/20244:58 PM  CHIEF COMPLAINT:   Diagnosis: metastatic prostate cancer    Cancer Staging  Prostate cancer [facility]) Staging form: Prostate, AJCC 8th Edition - Clinical stage from 08/25/2020: Stage IVB (cT1c,  cN1, cM1c, PSA: 5.4, Grade Group: 5) - Unsigned    Prior Therapy: XRT to prostate/pelvic nodes 11/16/20 - 01/14/21   Current Therapy:  ADT, Docetaxel q 21 days    HISTORY OF PRESENT ILLNESS:   Oncology History  Prostate cancer (HCC)  08/30/2020 Initial Diagnosis   Prostate cancer   03/07/2023 -  Chemotherapy   Patient is on Treatment Plan : PROSTATE Docetaxel  (75) q21d      Genetic Testing   No pathogenic variants identified on the [facility] Multi-Cancer+RNA panel. VUS in HOXB13 called c.314C>T and VUS in PMS2 called c.682G>A identified. The report date is 02/24/2023.  The Multi-Cancer + RNA Panel offered by [facility] includes sequencing and/or deletion/duplication analysis of the following 70 genes:  AIP*, ALK, APC*, ATM*, AXIN2*, BAP1*, BARD1*, BLM*, BMPR1A*, BRCA1*, BRCA2*, BRIP1*, CDC73*, CDH1*, CDK4, CDKN1B*, CDKN2A, CHEK2*, CTNNA1*, DICER1*, EPCAM, EGFR, FH*, FLCN*, GREM1, HOXB13, KIT, LZTR1, MAX*, MBD4, MEN1*, MET, MITF, MLH1*, MSH2*, MSH3*, MSH6*, MUTYH*, NF1*, NF2*, NTHL1*, PALB2*, PDGFRA, PMS2*, POLD1*, POLE*, POT1*, PRKAR1A*, PTCH1*, PTEN*, RAD51C*, RAD51D*, RB1*, RET, SDHA*, SDHAF2*, SDHB*, SDHC*, SDHD*, SMAD4*, SMARCA4*, SMARCB1*, SMARCE1*, STK11*, SUFU*, TMEM127*, TP53*, TSC1*, TSC2*, VHL*. RNA analysis is performed for * genes.      INTERVAL HISTORY:   Tony Powell is a 72 y.o. male presenting to clinic today for follow up of metastatic prostate cancer. He was last seen by me on 04/18/23.  Since his last visit, he underwent restaging PSMA PET scan on 05/03/23 showing: response to therapy of abdominopelvic nodal metastasis; small high precaval nodes do not demonstrate increased activity; mild progression of osseous metastasis; no new sites of metastasis; peripheral affinity about urinary bladder, likely due to early urinary excretion.  Today, he states that he is doing well overall. His appetite level is at 10%. His energy level is at 10%.  PAST MEDICAL HISTORY:   Past Medical History: Past Medical History:  Diagnosis Date   Abnormal stress test    Borderline hypertension    Chest discomfort    Atypical   DJD (degenerative joint disease)    Knees, lumbar sacral spine   Hyperlipidemia    Hypertension    Port-A-Cath in place 02/22/2023   Prostate cancer [facility])     Surgical History: Past Surgical History:  Procedure Laterality Date   BACK  SURGERY     CYSTOSCOPY WITH FULGERATION  04/18/2021   Procedure: CYSTOSCOPY WITH FULGERATION of BLADDER NECK;  Surgeon: Malen Gauze, MD;  Location: [facility];  Service: Urology;;   CYSTOSCOPY WITH INJECTION N/A 04/18/2021   Procedure: CYSTOSCOPY WITH INJECTION- Botox 100 units;  Surgeon: Malen Gauze, MD;  Location: [facility];  Service: Urology;  Laterality: N/A;   CYSTOSCOPY WITH RETROGRADE PYELOGRAM, URETEROSCOPY AND STENT PLACEMENT Left 02/01/2023   Procedure: CYSTOSCOPY WITH RETROGRADE PYELOGRAM, URETEROSCOPY AND STENT PLACEMENT;  Surgeon: Malen Gauze, MD;  Location: [facility];  Service: Urology;  Laterality: Left;   IR FLUORO GUIDED NEEDLE PLC ASPIRATION/INJECTION LOC  02/23/2023   IR IMAGING GUIDED PORT INSERTION  02/19/2023   STONE EXTRACTION WITH BASKET Left 02/01/2023   Procedure: STONE EXTRACTION WITH BASKET;  Surgeon: Malen Gauze, MD;  Location: [facility];  Service: Urology;  Laterality: Left;   TRANSURETHRAL RESECTION OF BLADDER TUMOR N/A 08/25/2020   Procedure: TRANSURETHRAL RESECTION OF BLADDER TUMOR (TURBT);  Surgeon: Malen Gauze, MD;  Location: [facility];  Service: Urology;  Laterality: N/A;    Social History: Social History   Socioeconomic History   Marital status: Divorced  Spouse name: Not on file   Number of children: 2   Years of education: Not on file   Highest education level: Not on file  Occupational History   Occupation: Long distance Scientist, water quality: [employer]  Tobacco Use   Smoking status: Former    Packs/day: 1.00    Years: 20.00    Additional pack years: 0.00    Total pack years: 20.00    Types: Cigarettes    Quit date: 11/13/1998    Years since quitting: 24.5    Passive exposure: Current   Smokeless tobacco: Never  Vaping Use   Vaping Use: Never used  Substance and Sexual Activity   Alcohol use: No   Drug use: Never   Sexual activity: Not Currently  Other Topics Concern   Not on file  Social History Narrative    Divorced   Social Determinants of Health   Financial Resource Strain: Low Risk  (09/14/2022)   Overall Financial Resource Strain (CARDIA)    Difficulty of Paying Living Expenses: Not hard at all  Food Insecurity: No Food Insecurity (02/15/2023)   Hunger Vital Sign    Worried About Running Out of Food in the Last Year: Never true    Ran Out of Food in the Last Year: Never true  Transportation Needs: No Transportation Needs (02/15/2023)   PRAPARE - Administrator, Civil Service (Medical): No    Lack of Transportation (Non-Medical): No  Physical Activity: Inactive (09/14/2022)   Exercise Vital Sign    Days of Exercise per Week: 0 days    Minutes of Exercise per Session: 0 min  Stress: No Stress Concern Present (09/14/2022)   [employer] of Occupational Health - Occupational Stress Questionnaire    Feeling of Stress : Only a little  Social Connections: Socially Isolated (09/14/2022)   Social Connection and Isolation Panel [NHANES]    Frequency of Communication with Friends and Family: Twice a week    Frequency of Social Gatherings with Friends and Family: Never    Attends Religious Services: 1 to 4 times per year    Active Member of [employer] or Organizations: No    Attends Banker Meetings: Never    Marital Status: Divorced  Catering manager Violence: Not At Risk (02/15/2023)   Humiliation, Afraid, Rape, and Kick questionnaire    Fear of Current or Ex-Partner: No    Emotionally Abused: No    Physically Abused: No    Sexually Abused: No    Family History: Family History  Problem Relation Age of Onset   Heart attack Mother    Breast cancer Mother        Mastectomy   Leukemia Father    Cancer Brother    Cancer Brother    Cancer Brother    Cancer Brother    Cancer Brother    Colon cancer Neg Hx    Prostate cancer Neg Hx    Pancreatic cancer Neg Hx     Current Medications:  Current Outpatient Medications:    alfuzosin (UROXATRAL) 10 MG 24 hr tablet,  Take 10 mg by mouth daily with breakfast., Disp: , Rfl:    cephALEXin (KEFLEX) 500 MG capsule, Take 500 mg by mouth 2 (two) times daily as needed (burning during urination)., Disp: , Rfl:    ciprofloxacin (CIPRO) 500 MG tablet, Take 1 tablet (500 mg total) by mouth 2 (two) times daily., Disp: 14 tablet, Rfl: 0   cyclobenzaprine (FLEXERIL) 5 MG tablet,  Take 1 tablet (5 mg total) by mouth 3 (three) times daily as needed for muscle spasms., Disp: 30 tablet, Rfl: 0   darolutamide (NUBEQA) 300 MG tablet, Take 2 tablets (600 mg total) by mouth 2 (two) times daily with a meal., Disp: 120 tablet, Rfl: 3   DOCETAXEL IV, Inject into the vein every 21 ( twenty-one) days., Disp: , Rfl:    Iron, Ferrous Sulfate, 325 (65 Fe) MG TABS, Take 325 mg by mouth every other day., Disp: 45 tablet, Rfl: 1   lidocaine-prilocaine (EMLA) cream, Apply a quarter sized amount to port a cath site and cover with plastic wrap one hour prior to infusion appointments, Disp: 30 g, Rfl: 3   megestrol (MEGACE) 400 MG/10ML suspension, Take 10 mLs (400 mg total) by mouth 2 (two) times daily., Disp: 480 mL, Rfl: 4   Multiple Vitamin (MULTIVITAMIN) capsule, Take 1 capsule by mouth daily., Disp: , Rfl:    naproxen sodium (ALEVE) 220 MG tablet, Take 220 mg by mouth once., Disp: , Rfl:    ondansetron (ZOFRAN-ODT) 8 MG disintegrating tablet, Take 1 tablet (8 mg total) by mouth every 8 (eight) hours as needed for nausea or vomiting., Disp: 90 tablet, Rfl: 0   oxybutynin (DITROPAN XL) 15 MG 24 hr tablet, TAKE ONE (1) TABLET (15 MG TOTAL) BY MOUTH DAILY. (Patient taking differently: Take 15 mg by mouth at bedtime.), Disp: 90 tablet, Rfl: 3   oxyCODONE (OXYCONTIN) 20 mg 12 hr tablet, Take 1 tablet (20 mg total) by mouth every 12 (twelve) hours., Disp: 60 tablet, Rfl: 0   oxyCODONE (ROXICODONE) 5 MG immediate release tablet, Take 2 tablets (10 mg total) by mouth every 6 (six) hours as needed for severe pain., Disp: 180 tablet, Rfl: 0   polyethylene  glycol-electrolytes (NULYTELY) 420 g solution, Take by mouth., Disp: , Rfl:    prochlorperazine (COMPAZINE) 10 MG tablet, TAKE ONE (1) TABLET (10 MG TOTAL) BY MOUTH EVERY SIX (6) (SIX) HOURS AS NEEDED FOR NAUSEA OR VOMITING., Disp: 30 tablet, Rfl: 1   rosuvastatin (CRESTOR) 5 MG tablet, Take 1 tablet (5 mg total) by mouth daily., Disp: 90 tablet, Rfl: 1   sildenafil (VIAGRA) 100 MG tablet, Take 1 tablet (100 mg total) by mouth daily as needed for erectile dysfunction., Disp: 30 tablet, Rfl: 5   tamsulosin (FLOMAX) 0.4 MG CAPS capsule, Take 1 capsule (0.4 mg total) by mouth daily., Disp: 30 capsule, Rfl: 2   valsartan (DIOVAN) 160 MG tablet, Take 1 tablet (160 mg total) by mouth daily., Disp: 90 tablet, Rfl: 3   finasteride (PROSCAR) 5 MG tablet, Take 1 tablet (5 mg total) by mouth daily., Disp: 90 tablet, Rfl: 3 No current facility-administered medications for this visit.  Facility-Administered Medications Ordered in Other Visits:    0.9 %  sodium chloride infusion, , Intravenous, Continuous, Doreatha Massed, MD, Stopped at 05/09/23 1230   Allergies: Allergies  Allergen Reactions   Celebrex [Celecoxib]     Eyes drooping and messed up skin on hands    REVIEW OF SYSTEMS:   Review of Systems  Constitutional:  Negative for chills, fatigue and fever.  HENT:   Negative for lump/mass, mouth sores, nosebleeds, sore throat and trouble swallowing.   Eyes:  Negative for eye problems.  Respiratory:  Negative for cough and shortness of breath.   Cardiovascular:  Negative for chest pain, leg swelling and palpitations.  Gastrointestinal:  Positive for constipation. Negative for abdominal pain, diarrhea, nausea and vomiting.  Genitourinary:  Negative for  bladder incontinence, difficulty urinating, dysuria, frequency, hematuria and nocturia.   Musculoskeletal:  Negative for arthralgias, back pain, flank pain, myalgias and neck pain.  Skin:  Negative for itching and rash.  Neurological:  Negative  for dizziness, headaches and numbness.  Hematological:  Does not bruise/bleed easily.  Psychiatric/Behavioral:  Positive for sleep disturbance. Negative for depression and suicidal ideas. The patient is not nervous/anxious.   All other systems reviewed and are negative.    VITALS:   Blood pressure (!) 73/62, pulse (!) 117, resp. rate 18, SpO2 99 %.  Wt Readings from Last 3 Encounters:  04/18/23 197 lb 15.9 oz (89.8 kg)  04/18/23 198 lb (89.8 kg)  03/28/23 209 lb 7 oz (95 kg)    There is no height or weight on file to calculate BMI.  Performance status (ECOG): 2 - Symptomatic, <50% confined to bed  PHYSICAL EXAM:   Physical Exam Vitals and nursing note reviewed. Exam conducted with a chaperone present.  Constitutional:      Appearance: Normal appearance.  Cardiovascular:     Rate and Rhythm: Normal rate and regular rhythm.     Pulses: Normal pulses.     Heart sounds: Normal heart sounds.  Pulmonary:     Effort: Pulmonary effort is normal.     Breath sounds: Normal breath sounds.  Abdominal:     Palpations: Abdomen is soft. There is no hepatomegaly, splenomegaly or mass.     Tenderness: There is no abdominal tenderness.  Musculoskeletal:     Right lower leg: No edema.     Left lower leg: No edema.  Lymphadenopathy:     Cervical: No cervical adenopathy.     Right cervical: No superficial, deep or posterior cervical adenopathy.    Left cervical: No superficial, deep or posterior cervical adenopathy.     Upper Body:     Right upper body: No supraclavicular or axillary adenopathy.     Left upper body: No supraclavicular or axillary adenopathy.  Neurological:     General: No focal deficit present.     Mental Status: He is alert and oriented to person, place, and time.  Psychiatric:        Mood and Affect: Mood normal.        Behavior: Behavior normal.     LABS:      Latest Ref Rng & Units 05/09/2023    9:34 AM 04/18/2023   11:40 AM 04/18/2023    9:36 AM  CBC  WBC 4.0  - 10.5 K/uL 6.5  7.6  7.1   Hemoglobin 13.0 - 17.0 g/dL 84.1  32.4  40.1   Hematocrit 39.0 - 52.0 % 33.8  34.8  34.0   Platelets 150 - 400 K/uL 315  395  451       Latest Ref Rng & Units 05/09/2023    9:34 AM 04/18/2023   11:40 AM 04/18/2023    9:36 AM  CMP  Glucose 70 - 99 mg/dL 027  253  664   BUN 8 - 23 mg/dL 43  9  9   Creatinine 4.03 - 1.24 mg/dL 4.74  2.59  5.63   Sodium 135 - 145 mmol/L 135  132  133   Potassium 3.5 - 5.1 mmol/L 4.5  3.7  3.6   Chloride 98 - 111 mmol/L 103  98  98   CO2 22 - 32 mmol/L 18  17  19    Calcium 8.9 - 10.3 mg/dL 9.4  9.1  9.1   Total  Protein 6.5 - 8.1 g/dL 7.8  7.2  7.2   Total Bilirubin 0.3 - 1.2 mg/dL 1.4  1.4  1.3   Alkaline Phos 38 - 126 U/L 118  120  120   AST 15 - 41 U/L 38  36  35   ALT 0 - 44 U/L 50  39  38      No results found for: "CEA1", "CEA" / No results found for: "CEA1", "CEA" Lab Results  Component Value Date   PSA1 1.0 05/04/2022   No results found for: "WUJ811" No results found for: "CAN125"  No results found for: "TOTALPROTELP", "ALBUMINELP", "A1GS", "A2GS", "BETS", "BETA2SER", "GAMS", "MSPIKE", "SPEI" Lab Results  Component Value Date   TIBC 436 08/17/2022   FERRITIN 35 08/17/2022   IRONPCTSAT 15 08/17/2022   No results found for: "LDH"   STUDIES:   NM PET (PSMA) SKULL TO MID THIGH  Result Date: 05/08/2023 CLINICAL DATA:  Prostate cancer with radiation therapy in 2021. Known metastasis. Evaluate treatment response. EXAM: NUCLEAR MEDICINE PET SKULL BASE TO THIGH TECHNIQUE: 8.3 mCi F18 Piflufolastat (Pylarify) was injected intravenously. Full-ring PET imaging was performed from the skull base to thigh after the radiotracer. CT data was obtained and used for attenuation correction and anatomic localization. COMPARISON:  01/10/2023 abdominopelvic CT. Most recent PET of 11/30/2022. FINDINGS: NECK No radiotracer activity in neck lymph nodes. Incidental CT finding: No cervical adenopathy. Bilateral carotid atherosclerosis.  CHEST No radiotracer accumulation within mediastinal or hilar lymph nodes. No suspicious pulmonary nodules on the CT scan. Incidental CT finding: Right Port-A-Cath tip high right atrium. Aortic and coronary artery calcification. Right hemidiaphragm elevation. ABDOMEN/PELVIS Prostate: No focal activity in the prostate bed. Lymph nodes: Low pre caval node measures 1.0 cm and a S.U.V. max of 201 on 243/3 versus 1.9 cm and a S.U.V. max of 186 on the prior exam (when remeasured). More cephalad precaval nodal affinity at up to a S.U.V. max of 26 today versus a S.U.V. max of 5.2 on the prior exam (when remeasured). Bilateral external iliac nodal tracer affinity. Index left-sided node measures 5 mm and a S.U.V. max of 35.2 on 274/4 versus 6 mm and a S.U.V. max of 55.0 on the prior. Liver: No evidence of liver metastasis. Irregular tracer affinity about the peripheral bladder likely represents early physiologic urinary excretion. No well-defined bladder nodularity. Incidental CT finding: Normal adrenal glands. Probable mild hepatic steatosis. Left renal vascular calcifications. Abdominal aortic atherosclerosis. Prostatectomy. Seminal vesicles remain. SKELETON Multifocal osseous metastasis again identified. Example within the posteromedial left ninth rib including at a S.U.V. max of 128 on 158/3. Compare a S.U.V. max of 90 on the prior exam. Slightly increased periosteal reaction at this level. Lesions within the T12 and L2 vertebral bodies measure a S.U.V. max of 146 and a S.U.V. max of 218 today versus a S.U.V. max of 197 and a S.U.V. max of 167 previously. The T12 lesion is more expansile today, crossing the midline including on 195/3. IMPRESSION: 1. Response to therapy of abdominopelvic nodal metastasis as evidenced by decreased size and tracer affinity as detailed above. Small high pre caval nodes do demonstrate increased activity. 2. Mild progression of osseous metastasis, primarily based on CT appearance. 3. No new  sites of metastasis identified. 4. Peripheral affinity about the urinary bladder is likely due to early urinary excretion. If signs/symptoms suggest bladder metastasis, cystoscopy could be performed. 5. Incidental findings, including: Aortic Atherosclerosis (ICD10-I70.0). Electronically Signed   By: Jeronimo Greaves M.D.   On:  05/08/2023 10:42      

## 2023-05-09 ENCOUNTER — Inpatient Hospital Stay: Payer: Medicare Other

## 2023-05-09 ENCOUNTER — Inpatient Hospital Stay (HOSPITAL_BASED_OUTPATIENT_CLINIC_OR_DEPARTMENT_OTHER): Payer: Medicare Other | Admitting: Hematology

## 2023-05-09 DIAGNOSIS — R112 Nausea with vomiting, unspecified: Secondary | ICD-10-CM | POA: Diagnosis not present

## 2023-05-09 DIAGNOSIS — C778 Secondary and unspecified malignant neoplasm of lymph nodes of multiple regions: Secondary | ICD-10-CM | POA: Diagnosis not present

## 2023-05-09 DIAGNOSIS — Z95828 Presence of other vascular implants and grafts: Secondary | ICD-10-CM

## 2023-05-09 DIAGNOSIS — C61 Malignant neoplasm of prostate: Secondary | ICD-10-CM

## 2023-05-09 DIAGNOSIS — M549 Dorsalgia, unspecified: Secondary | ICD-10-CM | POA: Insufficient documentation

## 2023-05-09 DIAGNOSIS — Z8249 Family history of ischemic heart disease and other diseases of the circulatory system: Secondary | ICD-10-CM | POA: Insufficient documentation

## 2023-05-09 DIAGNOSIS — Z87891 Personal history of nicotine dependence: Secondary | ICD-10-CM | POA: Insufficient documentation

## 2023-05-09 DIAGNOSIS — K5900 Constipation, unspecified: Secondary | ICD-10-CM | POA: Insufficient documentation

## 2023-05-09 DIAGNOSIS — R7989 Other specified abnormal findings of blood chemistry: Secondary | ICD-10-CM | POA: Insufficient documentation

## 2023-05-09 DIAGNOSIS — C7951 Secondary malignant neoplasm of bone: Secondary | ICD-10-CM | POA: Diagnosis not present

## 2023-05-09 DIAGNOSIS — R0602 Shortness of breath: Secondary | ICD-10-CM | POA: Insufficient documentation

## 2023-05-09 DIAGNOSIS — R319 Hematuria, unspecified: Secondary | ICD-10-CM | POA: Diagnosis not present

## 2023-05-09 DIAGNOSIS — I10 Essential (primary) hypertension: Secondary | ICD-10-CM | POA: Diagnosis not present

## 2023-05-09 DIAGNOSIS — Z79899 Other long term (current) drug therapy: Secondary | ICD-10-CM | POA: Diagnosis not present

## 2023-05-09 DIAGNOSIS — E785 Hyperlipidemia, unspecified: Secondary | ICD-10-CM | POA: Insufficient documentation

## 2023-05-09 DIAGNOSIS — R634 Abnormal weight loss: Secondary | ICD-10-CM | POA: Insufficient documentation

## 2023-05-09 DIAGNOSIS — N401 Enlarged prostate with lower urinary tract symptoms: Secondary | ICD-10-CM | POA: Diagnosis not present

## 2023-05-09 DIAGNOSIS — Z803 Family history of malignant neoplasm of breast: Secondary | ICD-10-CM | POA: Diagnosis not present

## 2023-05-09 DIAGNOSIS — Z886 Allergy status to analgesic agent status: Secondary | ICD-10-CM | POA: Insufficient documentation

## 2023-05-09 DIAGNOSIS — Z809 Family history of malignant neoplasm, unspecified: Secondary | ICD-10-CM | POA: Insufficient documentation

## 2023-05-09 DIAGNOSIS — N138 Other obstructive and reflux uropathy: Secondary | ICD-10-CM | POA: Diagnosis not present

## 2023-05-09 DIAGNOSIS — Z792 Long term (current) use of antibiotics: Secondary | ICD-10-CM | POA: Insufficient documentation

## 2023-05-09 LAB — CBC WITH DIFFERENTIAL/PLATELET
Abs Immature Granulocytes: 0.02 10*3/uL (ref 0.00–0.07)
Basophils Absolute: 0 10*3/uL (ref 0.0–0.1)
Basophils Relative: 1 %
Eosinophils Absolute: 0.1 10*3/uL (ref 0.0–0.5)
Eosinophils Relative: 2 %
HCT: 33.8 % — ABNORMAL LOW (ref 39.0–52.0)
Hemoglobin: 10.7 g/dL — ABNORMAL LOW (ref 13.0–17.0)
Immature Granulocytes: 0 %
Lymphocytes Relative: 9 %
Lymphs Abs: 0.6 10*3/uL — ABNORMAL LOW (ref 0.7–4.0)
MCH: 27.4 pg (ref 26.0–34.0)
MCHC: 31.7 g/dL (ref 30.0–36.0)
MCV: 86.7 fL (ref 80.0–100.0)
Monocytes Absolute: 0.6 10*3/uL (ref 0.1–1.0)
Monocytes Relative: 9 %
Neutro Abs: 5.2 10*3/uL (ref 1.7–7.7)
Neutrophils Relative %: 79 %
Platelets: 315 10*3/uL (ref 150–400)
RBC: 3.9 MIL/uL — ABNORMAL LOW (ref 4.22–5.81)
RDW: 17.3 % — ABNORMAL HIGH (ref 11.5–15.5)
WBC: 6.5 10*3/uL (ref 4.0–10.5)
nRBC: 0 % (ref 0.0–0.2)

## 2023-05-09 LAB — MAGNESIUM: Magnesium: 2.5 mg/dL — ABNORMAL HIGH (ref 1.7–2.4)

## 2023-05-09 LAB — PSA: Prostatic Specific Antigen: 4.79 ng/mL — ABNORMAL HIGH (ref 0.00–4.00)

## 2023-05-09 LAB — COMPREHENSIVE METABOLIC PANEL
ALT: 50 U/L — ABNORMAL HIGH (ref 0–44)
AST: 38 U/L (ref 15–41)
Albumin: 3.9 g/dL (ref 3.5–5.0)
Alkaline Phosphatase: 118 U/L (ref 38–126)
Anion gap: 14 (ref 5–15)
BUN: 43 mg/dL — ABNORMAL HIGH (ref 8–23)
CO2: 18 mmol/L — ABNORMAL LOW (ref 22–32)
Calcium: 9.4 mg/dL (ref 8.9–10.3)
Chloride: 103 mmol/L (ref 98–111)
Creatinine, Ser: 3.25 mg/dL — ABNORMAL HIGH (ref 0.61–1.24)
GFR, Estimated: 20 mL/min — ABNORMAL LOW (ref >60)
Glucose, Bld: 131 mg/dL — ABNORMAL HIGH (ref 70–99)
Potassium: 4.5 mmol/L (ref 3.5–5.1)
Sodium: 135 mmol/L (ref 135–145)
Total Bilirubin: 1.4 mg/dL — ABNORMAL HIGH (ref 0.3–1.2)
Total Protein: 7.8 g/dL (ref 6.5–8.1)

## 2023-05-09 MED ORDER — SODIUM CHLORIDE 0.9 % IV SOLN: INTRAVENOUS | Status: DC

## 2023-05-09 MED ORDER — SODIUM CHLORIDE 0.9% FLUSH
10.0000 mL | Freq: Once | INTRAVENOUS | Status: AC
  Administered 2023-05-09: 10 mL via INTRAVENOUS

## 2023-05-09 MED ORDER — FINASTERIDE 5 MG PO TABS
5.0000 mg | ORAL_TABLET | Freq: Every day | ORAL | 3 refills | Status: DC
Start: 2023-05-09 — End: 2024-06-27

## 2023-05-09 MED ORDER — SODIUM CHLORIDE 0.9% FLUSH: 10.0000 mL | Freq: Once | INTRAVENOUS | Status: AC

## 2023-05-09 MED ORDER — HEPARIN SOD (PORK) LOCK FLUSH 100 UNIT/ML IV SOLN
500.0000 [IU] | Freq: Once | INTRAVENOUS | Status: AC
  Administered 2023-05-09: 500 [IU] via INTRAVENOUS

## 2023-05-09 NOTE — Progress Notes (Signed)
Patient came in today for possible treatment. Patient is severely fatigued, unable to stand by himself,

## 2023-05-09 NOTE — Patient Instructions (Signed)
[facility]  Discharge Instructions: Thank you for choosing [facility] to provide your oncology and hematology care.  If you have a lab appointment with the Cancer Center - please note that after April 8th, 2024, all labs will be drawn in the cancer center.  You do not have to check in or register with the main entrance as you have in the past but will complete your check-in in the cancer center.  Wear comfortable clothing and clothing appropriate for easy access to any Portacath or PICC line.   We strive to give you quality time with your provider. You may need to reschedule your appointment if you arrive late (15 or more minutes).  Arriving late affects you and other patients whose appointments are after yours.  Also, if you miss three or more appointments without notifying the office, you may be dismissed from the clinic at the provider's discretion.      For prescription refill requests, have your pharmacy contact our office and allow 72 hours for refills to be completed.    Today you received 1 Liter of NS     BELOW ARE SYMPTOMS THAT SHOULD BE REPORTED IMMEDIATELY: *FEVER GREATER THAN 100.4 F (38 C) OR HIGHER *CHILLS OR SWEATING *NAUSEA AND VOMITING THAT IS NOT CONTROLLED WITH YOUR NAUSEA MEDICATION *UNUSUAL SHORTNESS OF BREATH *UNUSUAL BRUISING OR BLEEDING *URINARY PROBLEMS (pain or burning when urinating, or frequent urination) *BOWEL PROBLEMS (unusual diarrhea, constipation, pain near the anus) TENDERNESS IN MOUTH AND THROAT WITH OR WITHOUT PRESENCE OF ULCERS (sore throat, sores in mouth, or a toothache) UNUSUAL RASH, SWELLING OR PAIN  UNUSUAL VAGINAL DISCHARGE OR ITCHING   Items with * indicate a potential emergency and should be followed up as soon as possible or go to the Emergency Department if any problems should occur.  Please show the CHEMOTHERAPY ALERT CARD or IMMUNOTHERAPY ALERT CARD at check-in to the Emergency Department and triage  nurse.  Should you have questions after your visit or need to cancel or reschedule your appointment, please contact [facility] [phone]  and follow the prompts.  Office hours are 8:00 a.m. to 4:30 p.m. Monday - Friday. Please note that voicemails left after 4:00 p.m. may not be returned until the following business day.  We are closed weekends and major holidays. You have access to a nurse at all times for urgent questions. Please call the main number to the clinic [phone] and follow the prompts.  For any non-urgent questions, you may also contact your provider using MyChart. We now offer e-Visits for anyone 18 and older to request care online for non-urgent symptoms. For details visit [email or web address].   Also download the MyChart app! Go to the app store, search "MyChart", open the app, select , and log in with your MyChart username and password.   

## 2023-05-09 NOTE — Patient Instructions (Addendum)
[facility] at [facility] Discharge Instructions   You were seen and examined today by Dr. Ellin Saba.  He discussed the results of your PET scan. It is showing a mixed response to therapy. The lymph nodes in the abdomen have improved, while the bone spots have gotten slightly worse.   He discussed with you that your PSA is starting to climb.   Dr. Kirtland Bouchard does not recommend we pursue any further aggressive treatment with chemotherapy as this is causing you more harm than good.   We will make a referral to [facility] for them to come help you in the home. They will manage your pain medication and supply everything you would need to make you comfortable.    Thank you for choosing [facility] at [facility] to provide your oncology and hematology care.  To afford each patient quality time with our provider, please arrive at least 15 minutes before your scheduled appointment time.   If you have a lab appointment with the Cancer Center please come in thru the Main Entrance and check in at the main information desk.  You need to re-schedule your appointment should you arrive 10 or more minutes late.  We strive to give you quality time with our providers, and arriving late affects you and other patients whose appointments are after yours.  Also, if you no show three or more times for appointments you may be dismissed from the clinic at the providers discretion.     Again, thank you for choosing [facility]  Our hope is that these requests will decrease the amount of time that you wait before being seen by our physicians.       _____________________________________________________________  Should you have questions after your visit to [facility], please contact our office at [phone] and follow the prompts.  Our office hours are 8:00 a.m. and 4:30 p.m. Monday - Friday.  Please note that voicemails left after 4:00  p.m. may not be returned until the following business day.  We are closed weekends and major holidays.  You do have access to a nurse 24-7, just call the main number to the clinic [phone] and do not press any options, hold on the line and a nurse will answer the phone.    For prescription refill requests, have your pharmacy contact our office and allow 72 hours.    Due to Covid, you will need to wear a mask upon entering the hospital. If you do not have a mask, a mask will be given to you at the Main Entrance upon arrival. For doctor visits, patients may have 1 support person age 62 or older with them. For treatment visits, patients can not have anyone with them due to social distancing guidelines and our immunocompromised population.

## 2023-05-09 NOTE — Progress Notes (Signed)
Patient will not receive further treatment per Dr.K. We will refer patient to hospice. Patient will receive an additional liter of NS on top of the 500 mL due to pt's creatinine  3.25. Patient can be discharged when the fluids are finished per Dr.K.  Discharged from clinic via wheelchair in stable condition. Alert and oriented x 3. F/U with [facility] as scheduled.

## 2023-05-10 ENCOUNTER — Ambulatory Visit: Payer: Medicare Other

## 2023-05-11 ENCOUNTER — Ambulatory Visit: Payer: Medicare Other

## 2023-05-14 ENCOUNTER — Inpatient Hospital Stay: Payer: Medicare Other | Admitting: Dietician

## 2023-05-24 ENCOUNTER — Ambulatory Visit: Payer: Medicare Other

## 2023-05-30 ENCOUNTER — Inpatient Hospital Stay: Payer: Medicare Other

## 2023-05-30 ENCOUNTER — Inpatient Hospital Stay: Payer: Medicare Other | Admitting: Hematology

## 2023-06-04 ENCOUNTER — Other Ambulatory Visit: Payer: Self-pay | Admitting: Urology

## 2023-06-14 DEATH — deceased

## 2023-06-20 ENCOUNTER — Inpatient Hospital Stay: Payer: Medicare Other

## 2023-06-20 ENCOUNTER — Inpatient Hospital Stay: Payer: Medicare Other | Admitting: Hematology

## 2023-06-20 NOTE — Progress Notes (Signed)
This encounter was created in error - please disregard.

## 2023-06-21 ENCOUNTER — Encounter: Payer: Self-pay | Admitting: Hematology

## 2023-09-14 ENCOUNTER — Encounter: Payer: Self-pay | Admitting: Hematology

## 2023-09-20 ENCOUNTER — Telehealth: Payer: Self-pay

## 2023-09-20 NOTE — Telephone Encounter (Signed)
Copied from CRM [id number]. Topic: Appointments - Appointment Info/Confirmation >> Sep 20, 2023 10:01 AM Tiffany H wrote: Patient/patient representative called to advise that patient passed away in 06-16-23.

## 2024-06-12 ENCOUNTER — Encounter: Payer: Self-pay | Admitting: Licensed Clinical Social Worker

## 2024-06-12 ENCOUNTER — Ambulatory Visit: Payer: Self-pay | Admitting: Licensed Clinical Social Worker

## 2024-06-14 ENCOUNTER — Other Ambulatory Visit: Payer: Self-pay
